# Patient Record
Sex: Female | Born: 1990 | Race: Black or African American | Hispanic: No | Marital: Married | Smoking: Never smoker
Health system: Southern US, Community
[De-identification: ages and names within clinical notes are randomized; demographics above are authoritative.]

## PROBLEM LIST (undated history)

## (undated) DIAGNOSIS — R55 Syncope and collapse: Secondary | ICD-10-CM

## (undated) DIAGNOSIS — M25559 Pain in unspecified hip: Secondary | ICD-10-CM

## (undated) DIAGNOSIS — K5900 Constipation, unspecified: Secondary | ICD-10-CM

## (undated) DIAGNOSIS — G8929 Other chronic pain: Secondary | ICD-10-CM

## (undated) DIAGNOSIS — F419 Anxiety disorder, unspecified: Secondary | ICD-10-CM

## (undated) DIAGNOSIS — M549 Dorsalgia, unspecified: Secondary | ICD-10-CM

## (undated) DIAGNOSIS — R7303 Prediabetes: Secondary | ICD-10-CM

## (undated) DIAGNOSIS — R51 Headache: Secondary | ICD-10-CM

## (undated) DIAGNOSIS — D499 Neoplasm of unspecified behavior of unspecified site: Secondary | ICD-10-CM

## (undated) DIAGNOSIS — R519 Headache, unspecified: Secondary | ICD-10-CM

## (undated) DIAGNOSIS — D649 Anemia, unspecified: Secondary | ICD-10-CM

## (undated) DIAGNOSIS — R109 Unspecified abdominal pain: Secondary | ICD-10-CM

## (undated) HISTORY — DX: Prediabetes: R73.03

## (undated) HISTORY — DX: Constipation, unspecified: K59.00

## (undated) HISTORY — DX: Neoplasm of unspecified behavior of unspecified site: D49.9

## (undated) HISTORY — PX: TONSILLECTOMY: SUR1361

## (undated) HISTORY — DX: Pain in unspecified hip: M25.559

## (undated) HISTORY — DX: Dorsalgia, unspecified: M54.9

## (undated) HISTORY — PX: LAPAROSCOPIC GASTRIC SLEEVE RESECTION: SHX5895

---
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
## 2010-12-31 ENCOUNTER — Encounter: Payer: Self-pay | Admitting: *Deleted

## 2010-12-31 ENCOUNTER — Emergency Department (HOSPITAL_BASED_OUTPATIENT_CLINIC_OR_DEPARTMENT_OTHER)
Admission: EM | Admit: 2010-12-31 | Discharge: 2010-12-31 | Disposition: A | Discharge status: Home / Self Care | Payer: BC Managed Care – PPO | Attending: Emergency Medicine | Admitting: Emergency Medicine

## 2010-12-31 DIAGNOSIS — R110 Nausea: Secondary | ICD-10-CM | POA: Insufficient documentation

## 2010-12-31 DIAGNOSIS — R109 Unspecified abdominal pain: Secondary | ICD-10-CM | POA: Insufficient documentation

## 2010-12-31 LAB — URINALYSIS, ROUTINE W REFLEX MICROSCOPIC
Bilirubin Urine: NEGATIVE
Glucose, UA: NEGATIVE mg/dL
Ketones, ur: NEGATIVE mg/dL
Specific Gravity, Urine: 1.023 (ref 1.005–1.030)
pH: 7.5 (ref 5.0–8.0)

## 2010-12-31 MED ORDER — ONDANSETRON 4 MG PO TBDP
4.0000 mg | ORAL_TABLET | Freq: Once | ORAL | Status: AC
  Administered 2010-12-31: 4 mg via ORAL
  Filled 2010-12-31: qty 1

## 2010-12-31 NOTE — ED Notes (Signed)
Pt c/o abd cramping and nausea. Pt states has had problems with IUD.

## 2010-12-31 NOTE — ED Notes (Signed)
Pt assessment unchanged.

## 2010-12-31 NOTE — ED Notes (Signed)
Pt reports her nausea has improved. States she took ibuprofen before coming to ER, and that since her pain has subsided, her nausea has too. Pt states she feels well enough for discharge "since you cant really take out my IUD, i guess i'll have to wait to see my GYN doctor"

## 2010-12-31 NOTE — ED Provider Notes (Signed)
History     Chief Complaint  Patient presents with  . Abdominal Cramping   Patient is a 21 y.o. female presenting with cramps. The history is provided by the patient.  Abdominal Cramping The primary symptoms of the illness include nausea. The current episode started more than 2 days ago. The onset of the illness was gradual. The problem has been gradually worsening.  The nausea is associated with eating.  Pt reports she thinks her IUD makes her feel sick.  Pt is requesting to have removed.  Pt called Dr. Tawni Levy office but he did not have an openning today.  Pt complains of nausea  History reviewed. No pertinent past medical history.  Past Surgical History  Procedure Date  . Tonsillectomy     History reviewed. No pertinent family history.  History  Substance Use Topics  . Smoking status: Never Smoker   . Smokeless tobacco: Not on file  . Alcohol Use: No    OB History    Grav Para Term Preterm Abortions TAB SAB Ect Mult Living                  Review of Systems  Gastrointestinal: Positive for nausea.  All other systems reviewed and are negative.    Physical Exam  BP 107/78  Pulse 70  Temp(Src) 98.7 F (37.1 C) (Oral)  Resp 16  Wt 260 lb (117.935 kg)  SpO2 100%  LMP 12/30/2010  Physical Exam  Constitutional: She appears well-developed and well-nourished.  HENT:  Head: Normocephalic.  Eyes: Pupils are equal, round, and reactive to light.  Neck: Normal range of motion.  Cardiovascular: Normal rate.   Abdominal: Soft. There is no tenderness.  Musculoskeletal: Normal range of motion.  Neurological: She is alert.  Skin: Skin is warm.    ED Course  Procedures  MDM Pt given zofran she feels better,   Pt advised she needs to schedule to see Dr. Shawnie Pons to have iud removed      Langston Masker, [place] 12/31/10 1945

## 2013-07-27 ENCOUNTER — Encounter (INDEPENDENT_AMBULATORY_CARE_PROVIDER_SITE_OTHER): Payer: Self-pay | Admitting: General Surgery

## 2013-07-27 ENCOUNTER — Other Ambulatory Visit (INDEPENDENT_AMBULATORY_CARE_PROVIDER_SITE_OTHER): Payer: Self-pay | Admitting: General Surgery

## 2013-07-27 ENCOUNTER — Ambulatory Visit (INDEPENDENT_AMBULATORY_CARE_PROVIDER_SITE_OTHER): Payer: BC Managed Care – PPO | Admitting: General Surgery

## 2013-07-27 VITALS — BP 120/70 | HR 66 | Temp 97.7°F | Resp 18 | Ht 66.0 in | Wt 277.8 lb

## 2013-07-27 LAB — LIPID PANEL
Cholesterol: 205 mg/dL — ABNORMAL HIGH (ref 0–200)
HDL: 55 mg/dL (ref >39)
LDL CALC: 134 mg/dL — AB (ref 0–99)
TRIGLYCERIDES: 81 mg/dL (ref <150)
Total CHOL/HDL Ratio: 3.7 Ratio
VLDL: 16 mg/dL (ref 0–40)

## 2013-07-27 LAB — HEMOGLOBIN A1C
Hgb A1c MFr Bld: 5.6 %
Mean Plasma Glucose: 114 mg/dL

## 2013-07-27 LAB — CBC
HCT: 35.6 % — ABNORMAL LOW (ref 36.0–46.0)
Hemoglobin: 11.7 g/dL — ABNORMAL LOW (ref 12.0–15.0)
MCH: 25.9 pg — ABNORMAL LOW (ref 26.0–34.0)
MCHC: 32.9 g/dL (ref 30.0–36.0)
MCV: 78.8 fL (ref 78.0–100.0)
PLATELETS: 266 10*3/uL (ref 150–400)
RBC: 4.52 MIL/uL (ref 3.87–5.11)
RDW: 15.2 % (ref 11.5–15.5)
WBC: 10.4 10*3/uL (ref 4.0–10.5)

## 2013-07-27 NOTE — Patient Instructions (Signed)
We will start our evaluation process Consider going to the support group meetings to learn more from actual patients who have undergone surgery. We can give you the schedule. Watch the EMMI videos on sleeve and gastric bypass If you would like to come back in and further discuss the two, please let me know

## 2013-07-28 LAB — HCG, SERUM, QUALITATIVE: Preg, Serum: NEGATIVE

## 2013-07-28 LAB — H. PYLORI ANTIBODY, IGG: H Pylori IgG: <0.4 {ISR}

## 2013-07-28 LAB — TSH: TSH: 0.764 u[IU]/mL (ref 0.350–4.500)

## 2013-07-31 NOTE — Progress Notes (Signed)
Patient ID: Nancy Garrett, female   DOB: 1991-02-03, 23 y.o.   MRN: [id number]  Chief Complaint  Patient presents with  . Bariatric Pre-op    HPI Nancy Garrett is a 23 y.o. female.   HPI 23 year old morbidly obese African American female is referred to discuss weight loss surgery. She is specifically interested in either the sleeve gastrectomy or gastric bypass.She has  Struggled with her weight ever since she was a little girl. Despite numerous attempts for sustained weight loss she has been unsuccessful. She has tried Atkins, bariatric clinic, hCG injections, phentermine all without any long-term success. She works as a Associate Professor in a salon  Her comorbidities include prediabetes, migraines, bilateral hip pain.  History reviewed. No pertinent past medical history.  Past Surgical History  Procedure Laterality Date  . Tonsillectomy      History reviewed. No pertinent family history.  Social History History  Substance Use Topics  . Smoking status: Never Smoker   . Smokeless tobacco: Not on file  . Alcohol Use: No    No Known Allergies  Current Outpatient Prescriptions  Medication Sig Dispense Refill  . levonorgestrel (MIRENA) 20 MCG/24HR IUD 1 each by Intrauterine route once.         No current facility-administered medications for this visit.    Review of Systems Review of Systems  Constitutional: Negative for fever, activity change, appetite change and unexpected weight change.  HENT: Negative for nosebleeds and trouble swallowing.   Eyes: Negative for photophobia and visual disturbance.  Respiratory: Negative for chest tightness and shortness of breath.        Epworth score 7  Cardiovascular: Negative for chest pain and leg swelling.       Denies CP, SOB, orthopnea, PND, DOE; some b/l ankle edema  Gastrointestinal: Negative for nausea, vomiting, abdominal pain, diarrhea, blood in stool and abdominal distention.       Denies reflux. Some constipation.    Endocrine:       Reports that she was told she was prediabetic  Genitourinary: Negative for dysuria and difficulty urinating.  Musculoskeletal: Negative for arthralgias.       B/l ankle and hip pain  Skin: Negative for pallor and rash.  Neurological: Positive for headaches (migraines - has taken topiramate for it). Negative for dizziness, seizures, facial asymmetry and numbness.       Denies TIA and amaurosis fugax   Hematological: Negative for adenopathy. Does not bruise/bleed easily.  Psychiatric/Behavioral: Negative for behavioral problems and agitation.    Blood pressure 120/70, pulse 66, temperature 97.7 F (36.5 C), temperature source Temporal, resp. rate 18, height 5\' 6"  (1.676 m), weight 277 lb 12.8 oz (126.009 kg).  Physical Exam Physical Exam  Vitals reviewed. Constitutional: She is oriented to person, place, and time. She appears well-developed and well-nourished. No distress.  HENT:  Head: Normocephalic and atraumatic.  Right Ear: External ear normal.  Left Ear: External ear normal.  Eyes: Conjunctivae are normal. No scleral icterus.  Neck: Normal range of motion. Neck supple. No tracheal deviation present. No thyromegaly present.  Cardiovascular: Normal rate and normal heart sounds.   Pulmonary/Chest: Effort normal and breath sounds normal. No stridor. No respiratory distress. She has no wheezes.  Abdominal: Soft. She exhibits no distension. There is no tenderness. There is no rebound and no guarding.  Belly button rng  Musculoskeletal: She exhibits no edema and no tenderness.  Lymphadenopathy:    She has no cervical adenopathy.  Neurological: She is alert and oriented to person,  place, and time. She exhibits normal muscle tone.  Skin: Skin is warm and dry. No rash noted. She is not diaphoretic. No erythema.  Psychiatric: She has a normal mood and affect. Her behavior is normal. Judgment and thought content normal.    Data Reviewed None available   Assessment     Morbid obesity BMI 44.8 B/l ankle pain B/l hip pain Migraines prediabetic      Plan    The patient meets weight loss surgery criteria. I think the patient would be an acceptable candidate for Laparoscopic Roux-en-Y Gastric bypass or Sleeve gastrectomy  We discussed laparoscopic Roux-en-Y gastric bypass. We discussed the preoperative, operative and postoperative process. Using diagrams, I explained the surgery in detail including the performance of an EGD near the end of the surgery and an Upper GI swallow study on POD 1. We discussed the typical hospital course including a 2-3 day stay baring any complications.   The patient was given educational material. I quoted the patient that they can expect to lose 50-70% of their excess weight with the gastric bypass. We did discuss the possibility of weight regain several years after the procedure.  We discussed the risk and benefits of surgery including but not limited to anesthesia risk, bleeding, infection, anastomotic edema requiring a few additional days in the hospital, postop nausea, possible conversion to open procedure, blood clot formation, anastomotic leak, anastomotic stricture, ulcer formation, death, respiratory complications, intestinal blockage, internal hernia, gallstone formation, vitamin and nutritional deficiencies, hair loss, weight regain injury to surrounding structures, failure to lose weight and mood changes.  We discussed that before and after surgery that there would be an alteration in their diet. I explained that we have put them on a diet 2 weeks before surgery. I also explained that they would be on a liquid diet for 2 weeks after surgery. We discussed that they would have to avoid certain foods such as sugar after surgery. We discussed the importance of physical activity as well as compliance with our dietary and supplement recommendations and routine follow-up.  We then discussed laparoscopic sleeve gastrectomy. We  discussed the preoperative, operative and postoperative process. Using diagrams, I explained the surgery in detail including the performance of an EGD near the end of the surgery and an Upper GI swallow study on POD 1. We discussed the typical hospital course including a 2-3 day stay baring any complications.   The patient was given educational material. I quoted the patient that most patients can lose up to 50-70% of their excess weight. We did discuss the possibility of weight regain several years after the procedure.  The risks of infection, bleeding, pain, scarring, weight regain, too little or too much weight loss, vitamin deficiencies and need for lifelong vitamin supplementation, hair loss, need for protein supplementation, leaks, stricture, reflux, food intolerance, gallstone formation, hernia, need for reoperation and conversion to roux Y gastric bypass, need for open surgery, injury to spleen or surrounding structures, DVT's, PE, and death again discussed with the patient and the patient expressed understanding and desires to proceed with laparoscopic vertical sleeve gastrectomy, possible open, intraoperative endoscopy.  I explained to the patient that we will start our evaluation process which includes labs, Upper GI to evaluate stomach and swallowing anatomy, nutritionist consultation, psychiatrist consultation, EKG, CXR, abdominal ultrasound. All of her questions were asked and answered. She was given access to watch videos about sleep gastrectomy and gastric bypass using the EMMI video system.  She was encouraged to contact the  office should she have any questions during the workup  Mary Sellaric M. Andrey CampanileWilson, MD, FACS General, Bariatric, & Minimally Invasive Surgery [facility], [place]          [facility] CenterWILSON,Jamielee Mchale M 07/31/2013, 4:08 PM

## 2013-08-15 ENCOUNTER — Ambulatory Visit (HOSPITAL_COMMUNITY)
Admission: RE | Admit: 2013-08-15 | Discharge: 2013-08-15 | Disposition: A | Discharge status: Home / Self Care | Payer: BC Managed Care – PPO | Source: Ambulatory Visit | Attending: General Surgery | Admitting: General Surgery

## 2013-08-15 ENCOUNTER — Other Ambulatory Visit: Payer: Self-pay

## 2013-08-15 DIAGNOSIS — M25559 Pain in unspecified hip: Secondary | ICD-10-CM | POA: Insufficient documentation

## 2013-08-15 DIAGNOSIS — R7309 Other abnormal glucose: Secondary | ICD-10-CM | POA: Insufficient documentation

## 2013-08-15 DIAGNOSIS — Z6841 Body Mass Index (BMI) 40.0 and over, adult: Secondary | ICD-10-CM | POA: Insufficient documentation

## 2013-08-15 DIAGNOSIS — M25579 Pain in unspecified ankle and joints of unspecified foot: Secondary | ICD-10-CM | POA: Insufficient documentation

## 2013-08-17 ENCOUNTER — Other Ambulatory Visit (INDEPENDENT_AMBULATORY_CARE_PROVIDER_SITE_OTHER): Payer: Self-pay

## 2013-09-23 ENCOUNTER — Encounter: Payer: BC Managed Care – PPO | Attending: General Surgery | Admitting: Dietician

## 2014-03-23 ENCOUNTER — Encounter (HOSPITAL_BASED_OUTPATIENT_CLINIC_OR_DEPARTMENT_OTHER): Payer: Self-pay | Admitting: Emergency Medicine

## 2014-03-23 ENCOUNTER — Emergency Department (HOSPITAL_BASED_OUTPATIENT_CLINIC_OR_DEPARTMENT_OTHER)
Admission: EM | Admit: 2014-03-23 | Discharge: 2014-03-23 | Disposition: A | Discharge status: Home / Self Care | Payer: BC Managed Care – PPO | Attending: Emergency Medicine | Admitting: Emergency Medicine

## 2014-03-23 DIAGNOSIS — R51 Headache: Secondary | ICD-10-CM | POA: Diagnosis present

## 2014-03-23 DIAGNOSIS — E669 Obesity, unspecified: Secondary | ICD-10-CM | POA: Insufficient documentation

## 2014-03-23 DIAGNOSIS — G43911 Migraine, unspecified, intractable, with status migrainosus: Secondary | ICD-10-CM | POA: Diagnosis not present

## 2014-03-23 HISTORY — DX: Headache, unspecified: R51.9

## 2014-03-23 HISTORY — DX: Headache: R51

## 2014-03-23 LAB — CBG MONITORING, ED: Glucose-Capillary: 70 mg/dL (ref 70–99)

## 2014-03-23 MED ORDER — OXYCODONE-ACETAMINOPHEN 5-325 MG PO TABS
2.0000 | ORAL_TABLET | Freq: Once | ORAL | Status: AC
  Administered 2014-03-23: 2 via ORAL
  Filled 2014-03-23: qty 2

## 2014-03-23 MED ORDER — SUMATRIPTAN SUCCINATE 50 MG PO TABS: 50.0000 mg | ORAL_TABLET | ORAL | Status: DC | PRN

## 2014-03-23 MED ORDER — PROCHLORPERAZINE MALEATE 10 MG PO TABS: 10.0000 mg | ORAL_TABLET | Freq: Two times a day (BID) | ORAL | Status: DC | PRN

## 2014-03-23 NOTE — Discharge Instructions (Signed)

## 2014-03-23 NOTE — ED Provider Notes (Signed)
CSN: [id number]     Arrival date & time 03/23/14  1716 History   First MD Initiated Contact with Patient 03/23/14 1822     Chief Complaint  Patient presents with  . Headache     (Consider location/radiation/quality/duration/timing/severity/associated sxs/prior Treatment) HPI 23 y.o. Female with migraine headaches for several years.  She feels they began when she had motion sickness on a cruise 3 years ago but she remembers getting migraines as a child when she got car sick.  She has migraines daily.  They are usually preceded by nausea and vertigo.  She denies lateralized weakness, fever, or neck pain.  SHe has been treated at her pmd with topamax   For prophylaxis but states it has not helped.  She has an appointment at the headache clinic in 3 weeks.  She feels the headaches are more frequent over the past several weeks.  THe headaches are present at all times of the day.  She denies headache being worse in the am and improving during the day although she is obese.  The headache is not worse with lying down.    Past Medical History  Diagnosis Date  . Head ache    Past Surgical History  Procedure Laterality Date  . Tonsillectomy     No family history on file. History  Substance Use Topics  . Smoking status: Never Smoker   . Smokeless tobacco: Not on file  . Alcohol Use: No   OB History   Grav Para Term Preterm Abortions TAB SAB Ect Mult Living                 Review of Systems  All other systems reviewed and are negative.     Allergies  Review of patient's allergies indicates no known allergies.  Home Medications   Prior to Admission medications   Medication Sig Start Date End Date Taking? Authorizing Provider  levonorgestrel (MIRENA) 20 MCG/24HR IUD 1 each by Intrauterine route once.      Historical Provider, MD   BP 122/78  Pulse 70  Temp(Src) 98.4 F (36.9 C) (Oral)  Resp 20  Wt 277 lb (125.646 kg)  SpO2 100%  LMP 03/23/2014 Physical Exam  Nursing note and  vitals reviewed. Constitutional: She is oriented to person, place, and time. She appears well-developed and well-nourished.  Obese  HENT:  Head: Normocephalic and atraumatic.  Right Ear: Tympanic membrane and external ear normal.  Left Ear: Tympanic membrane and external ear normal.  Nose: Nose normal. Right sinus exhibits no maxillary sinus tenderness and no frontal sinus tenderness. Left sinus exhibits no maxillary sinus tenderness and no frontal sinus tenderness.  Eyes: Conjunctivae and EOM are normal. Pupils are equal, round, and reactive to light. Right eye exhibits no nystagmus. Left eye exhibits no nystagmus.  Neck: Normal range of motion. Neck supple.  Cardiovascular: Normal rate, regular rhythm, normal heart sounds and intact distal pulses.   Pulmonary/Chest: Effort normal and breath sounds normal. No respiratory distress. She exhibits no tenderness.  Abdominal: Soft. Bowel sounds are normal. She exhibits no distension and no mass. There is no tenderness.  Musculoskeletal: Normal range of motion. She exhibits no edema and no tenderness.  Neurological: She is alert and oriented to person, place, and time. She has normal strength and normal reflexes. No sensory deficit. She displays a negative Romberg sign. GCS eye subscore is 4. GCS verbal subscore is 5. GCS motor subscore is 6.  Reflex Scores:      Tricep reflexes are  2+ on the right side and 2+ on the left side.      Bicep reflexes are 2+ on the right side and 2+ on the left side.      Brachioradialis reflexes are 2+ on the right side and 2+ on the left side.      Patellar reflexes are 2+ on the right side and 2+ on the left side.      Achilles reflexes are 2+ on the right side and 2+ on the left side. Patient with normal gait without ataxia, shuffling, spasm, or antalgia. Speech is normal without dysarthria, dysphasia, or aphasia. Muscle strength is 5/5 in bilateral shoulders, elbow flexor and extensors, wrist flexor and extensors,  and intrinsic hand muscles. 5/5 bilateral lower extremity hip flexors, extensors, knee flexors and extensors, and ankle dorsi and plantar flexors.    Skin: Skin is warm and dry. No rash noted.  Psychiatric: She has a normal mood and affect. Her behavior is normal. Judgment and thought content normal.    ED Course  Procedures (including critical care time) Labs Review Labs Reviewed  CBG MONITORING, ED    Imaging Review No results found.   EKG Interpretation None      MDM   Final diagnoses:  Intractable migraine with status migrainosus, unspecified migraine type    Patient has had MRI of her primary care physicians within the past year which she self reports as normal. I am unable to obtain these results currently. Plan Percocet to treat acute pain today.. I will also give her a prescription for tryptan and place  her on Compazine as an outpatient to see if this helps her headaches while she is waiting to be seen by a headache specialist. I have discussed return precautions with her and need for close followup and she voices understanding.    Hilario Quarryanielle S Jamond Neels, MD 03/23/14 [phone]

## 2014-03-23 NOTE — ED Notes (Signed)
Headache x 3 weeks. Hx of headaches x 3 years.

## 2014-10-29 ENCOUNTER — Emergency Department (HOSPITAL_COMMUNITY)
Admission: EM | Admit: 2014-10-29 | Discharge: 2014-10-29 | Disposition: A | Discharge status: Home / Self Care | Payer: Self-pay | Attending: Emergency Medicine | Admitting: Emergency Medicine

## 2014-10-29 ENCOUNTER — Encounter (HOSPITAL_COMMUNITY): Payer: Self-pay | Admitting: *Deleted

## 2014-10-29 DIAGNOSIS — R51 Headache: Secondary | ICD-10-CM | POA: Insufficient documentation

## 2014-10-29 DIAGNOSIS — Z8719 Personal history of other diseases of the digestive system: Secondary | ICD-10-CM | POA: Insufficient documentation

## 2014-10-29 DIAGNOSIS — G8929 Other chronic pain: Secondary | ICD-10-CM | POA: Insufficient documentation

## 2014-10-29 DIAGNOSIS — Z79899 Other long term (current) drug therapy: Secondary | ICD-10-CM | POA: Insufficient documentation

## 2014-10-29 DIAGNOSIS — A599 Trichomoniasis, unspecified: Secondary | ICD-10-CM | POA: Insufficient documentation

## 2014-10-29 HISTORY — DX: Unspecified abdominal pain: R10.9

## 2014-10-29 HISTORY — DX: Other chronic pain: G89.29

## 2014-10-29 LAB — COMPREHENSIVE METABOLIC PANEL
ALT: 8 U/L — ABNORMAL LOW (ref 14–54)
ANION GAP: 8 (ref 5–15)
AST: 21 U/L (ref 15–41)
Albumin: 3.6 g/dL (ref 3.5–5.0)
Alkaline Phosphatase: 97 U/L (ref 38–126)
BILIRUBIN TOTAL: 1 mg/dL (ref 0.3–1.2)
BUN: 11 mg/dL (ref 6–20)
CALCIUM: 9.2 mg/dL (ref 8.9–10.3)
CHLORIDE: 105 mmol/L (ref 101–111)
CO2: 26 mmol/L (ref 22–32)
CREATININE: 0.7 mg/dL (ref 0.44–1.00)
GLUCOSE: 98 mg/dL (ref 65–99)
Potassium: 4.6 mmol/L (ref 3.5–5.1)
Sodium: 139 mmol/L (ref 135–145)
Total Protein: 6.5 g/dL (ref 6.5–8.1)

## 2014-10-29 LAB — CBC WITH DIFFERENTIAL/PLATELET
BASOS ABS: 0 10*3/uL (ref 0.0–0.1)
BASOS PCT: 0 % (ref 0–1)
EOS PCT: 0 % (ref 0–5)
Eosinophils Absolute: 0 10*3/uL (ref 0.0–0.7)
HEMATOCRIT: 36.9 % (ref 36.0–46.0)
HEMOGLOBIN: 11.9 g/dL — AB (ref 12.0–15.0)
Lymphocytes Relative: 28 % (ref 12–46)
Lymphs Abs: 2.8 10*3/uL (ref 0.7–4.0)
MCH: 26 pg (ref 26.0–34.0)
MCHC: 32.2 g/dL (ref 30.0–36.0)
MCV: 80.7 fL (ref 78.0–100.0)
MONO ABS: 0.5 10*3/uL (ref 0.1–1.0)
MONOS PCT: 5 % (ref 3–12)
NEUTROS ABS: 6.8 10*3/uL (ref 1.7–7.7)
Neutrophils Relative %: 67 % (ref 43–77)
Platelets: 255 10*3/uL (ref 150–400)
RBC: 4.57 MIL/uL (ref 3.87–5.11)
RDW: 15.7 % — ABNORMAL HIGH (ref 11.5–15.5)
WBC: 10.1 10*3/uL (ref 4.0–10.5)

## 2014-10-29 LAB — URINALYSIS, ROUTINE W REFLEX MICROSCOPIC
BILIRUBIN URINE: NEGATIVE
Glucose, UA: NEGATIVE mg/dL
KETONES UR: NEGATIVE mg/dL
NITRITE: NEGATIVE
PROTEIN: NEGATIVE mg/dL
SPECIFIC GRAVITY, URINE: 1.014 (ref 1.005–1.030)
UROBILINOGEN UA: 0.2 mg/dL (ref 0.0–1.0)
pH: 7 (ref 5.0–8.0)

## 2014-10-29 LAB — WET PREP, GENITAL: YEAST WET PREP: NONE SEEN

## 2014-10-29 LAB — URINE MICROSCOPIC-ADD ON

## 2014-10-29 LAB — LIPASE, BLOOD: Lipase: 17 U/L — ABNORMAL LOW (ref 22–51)

## 2014-10-29 LAB — I-STAT BETA HCG BLOOD, ED (MC, WL, AP ONLY): I-stat hCG, quantitative: <5 m[IU]/mL (ref <5)

## 2014-10-29 MED ORDER — METRONIDAZOLE 500 MG PO TABS: 500.0000 mg | ORAL_TABLET | Freq: Two times a day (BID) | ORAL | Status: DC

## 2014-10-29 MED ORDER — METOCLOPRAMIDE HCL 5 MG/ML IJ SOLN
10.0000 mg | Freq: Once | INTRAMUSCULAR | Status: AC
  Administered 2014-10-29: 10 mg via INTRAVENOUS
  Filled 2014-10-29: qty 2

## 2014-10-29 MED ORDER — DICYCLOMINE HCL 10 MG/ML IM SOLN
20.0000 mg | Freq: Once | INTRAMUSCULAR | Status: AC
  Administered 2014-10-29: 20 mg via INTRAMUSCULAR
  Filled 2014-10-29: qty 2

## 2014-10-29 MED ORDER — SODIUM CHLORIDE 0.9 % IV BOLUS (SEPSIS)
500.0000 mL | Freq: Once | INTRAVENOUS | Status: AC
  Administered 2014-10-29: 500 mL via INTRAVENOUS

## 2014-10-29 MED ORDER — IBUPROFEN 800 MG PO TABS
800.0000 mg | ORAL_TABLET | Freq: Once | ORAL | Status: AC
Start: 2014-10-29 — End: 2014-10-29
  Administered 2014-10-29: 800 mg via ORAL
  Filled 2014-10-29: qty 1

## 2014-10-29 NOTE — ED Notes (Signed)
Pelvic cart set up at bedside  

## 2014-10-29 NOTE — Discharge Instructions (Signed)
Sexually Transmitted Disease Take antibiotics as prescribed.  Avoid drinking alcohol for 24 hours after taking this medication. Your sexual partner should also be treated.  A sexually transmitted disease (STD) is a disease or infection that may be passed (transmitted) from person to person, usually during sexual activity. This may happen by way of saliva, semen, blood, vaginal mucus, or urine. Common STDs include:   Gonorrhea.   Chlamydia.   Syphilis.   HIV and AIDS.   Genital herpes.   Hepatitis B and C.   Trichomonas.   Human papillomavirus (HPV).   Pubic lice.   Scabies.  Mites.  Bacterial vaginosis. WHAT ARE CAUSES OF STDs? An STD may be caused by bacteria, a virus, or parasites. STDs are often transmitted during sexual activity if one person is infected. However, they may also be transmitted through nonsexual means. STDs may be transmitted after:   Sexual intercourse with an infected person.   Sharing sex toys with an infected person.   Sharing needles with an infected person or using unclean piercing or tattoo needles.  Having intimate contact with the genitals, mouth, or rectal areas of an infected person.   Exposure to infected fluids during birth. WHAT ARE THE SIGNS AND SYMPTOMS OF STDs? Different STDs have different symptoms. Some people may not have any symptoms. If symptoms are present, they may include:   Painful or bloody urination.   Pain in the pelvis, abdomen, vagina, anus, throat, or eyes.   A skin rash, itching, or irritation.  Growths, ulcerations, blisters, or sores in the genital and anal areas.  Abnormal vaginal discharge with or without bad odor.   Penile discharge in men.   Fever.   Pain or bleeding during sexual intercourse.   Swollen glands in the groin area.   Yellow skin and eyes (jaundice). This is seen with hepatitis.   Swollen testicles.  Infertility.  Sores and blisters in the mouth. HOW ARE STDs  DIAGNOSED? To make a diagnosis, your health care provider may:   Take a medical history.   Perform a physical exam.   Take a sample of any discharge to examine.  Swab the throat, cervix, opening to the penis, rectum, or vagina for testing.  Test a sample of your first morning urine.   Perform blood tests.   Perform a Pap test, if this applies.   Perform a colposcopy.   Perform a laparoscopy.  HOW ARE STDs TREATED? Treatment depends on the STD. Some STDs may be treated but not cured.   Chlamydia, gonorrhea, trichomonas, and syphilis can be cured with antibiotic medicine.   Genital herpes, hepatitis, and HIV can be treated, but not cured, with prescribed medicines. The medicines lessen symptoms.   Genital warts from HPV can be treated with medicine or by freezing, burning (electrocautery), or surgery. Warts may come back.   HPV cannot be cured with medicine or surgery. However, abnormal areas may be removed from the cervix, vagina, or vulva.   If your diagnosis is confirmed, your recent sexual partners need treatment. This is true even if they are symptom-free or have a negative culture or evaluation. They should not have sex until their health care providers say it is okay. HOW CAN I REDUCE MY RISK OF GETTING AN STD? Take these steps to reduce your risk of getting an STD:  Use latex condoms, dental dams, and water-soluble lubricants during sexual activity. Do not use petroleum jelly or oils.  Avoid having multiple sex partners.  Do not have  sex with someone who has other sex partners.  Do not have sex with anyone you do not know or who is at high risk for an STD.  Avoid risky sex practices that can break your skin.  Do not have sex if you have open sores on your mouth or skin.  Avoid drinking too much alcohol or taking illegal drugs. Alcohol and drugs can affect your judgment and put you in a vulnerable position.  Avoid engaging in oral and anal sex  acts.  Get vaccinated for HPV and hepatitis. If you have not received these vaccines in the past, talk to your health care provider about whether one or both might be right for you.   If you are at risk of being infected with HIV, it is recommended that you take a prescription medicine daily to prevent HIV infection. This is called pre-exposure prophylaxis (PrEP). You are considered at risk if:  You are a man who has sex with other men (MSM).  You are a heterosexual man or woman and are sexually active with more than one partner.  You take drugs by injection.  You are sexually active with a partner who has HIV.  Talk with your health care provider about whether you are at high risk of being infected with HIV. If you choose to begin PrEP, you should first be tested for HIV. You should then be tested every 3 months for as long as you are taking PrEP.  WHAT SHOULD I DO IF I THINK I HAVE AN STD?  See your health care provider.   Tell your sexual partner(s). They should be tested and treated for any STDs.  Do not have sex until your health care provider says it is okay. WHEN SHOULD I GET IMMEDIATE MEDICAL CARE? Contact your health care provider right away if:   You have severe abdominal pain.  You are a man and notice swelling or pain in your testicles.  You are a woman and notice swelling or pain in your vagina. Document Released: 08/22/2002 Document Revised: 06/06/2013 Document Reviewed: 12/20/2012 [employer] Patient Information 2015 [employer] [place]. This information is not intended to replace advice given to you by your health care provider. Make sure you discuss any questions you have with your health care provider.

## 2014-10-29 NOTE — ED Notes (Signed)
Pt is in stable condition upon d/c and ambulates from ED. 

## 2014-10-29 NOTE — ED Notes (Signed)
Pt arrives from home via POV. Pt has c/o n/v/d. Pt states she woke up around 0500 and started having vomiting and diarrhea. Pt states she almost always as a h/a and also reports she has been having intermittent abd pain x 1 year.

## 2014-10-29 NOTE — ED Provider Notes (Signed)
CSN: [id number]     Arrival date & time 10/29/14  0957 History   First MD Initiated Contact with Patient 10/29/14 1028     Chief Complaint  Patient presents with  . Emesis  . Diarrhea     (Consider location/radiation/quality/duration/timing/severity/associated sxs/prior Treatment) Patient is a 24 y.o. female presenting with vomiting and diarrhea. The history is provided by the patient. No language interpreter was used.  Emesis Associated symptoms: abdominal pain, diarrhea and headaches   Associated symptoms: no chills   Diarrhea Associated symptoms: abdominal pain, headaches and vomiting   Associated symptoms: no chills and no fever    Nancy Garrett is a 24 y.o female with a history of headaches and chronic abdominal pain who presents for intermittent, sharp abdominal pain, nausea, vomiting, and diarrhea that began today at 5 am.  She is also complaining of intermittent headaches that began on Saturday.  She states that she is followed by a gastroenterologist for chronic constipation. She take topamax for her headaches as prescribed by her pcp. Her LMP was 09/27/14.  Past Medical History  Diagnosis Date  . Head ache   . Chronic abdominal pain    Past Surgical History  Procedure Laterality Date  . Tonsillectomy     History reviewed. No pertinent family history. History  Substance Use Topics  . Smoking status: Never Smoker   . Smokeless tobacco: Not on file  . Alcohol Use: No   OB History    No data available     Review of Systems  Constitutional: Negative for fever and chills.  Eyes: Negative for photophobia and visual disturbance.  Gastrointestinal: Positive for nausea, vomiting, abdominal pain and diarrhea. Negative for blood in stool.  Genitourinary: Negative for urgency, frequency, hematuria, difficulty urinating and menstrual problem.  Neurological: Positive for headaches. Negative for dizziness, syncope and light-headedness.  All other systems reviewed and are  negative.     Allergies  Ivp dye  Home Medications   Prior to Admission medications   Medication Sig Start Date End Date Taking? Authorizing Provider  topiramate (TOPAMAX) 50 MG tablet Take 50 mg by mouth 2 (two) times daily.   Yes Historical Provider, MD  metroNIDAZOLE (FLAGYL) 500 MG tablet Take 1 tablet (500 mg total) by mouth 2 (two) times daily. 10/29/14   Dora Simeone Patel-Mills, PA-C  prochlorperazine (COMPAZINE) 10 MG tablet Take 1 tablet (10 mg total) by mouth 2 (two) times daily as needed for nausea or vomiting. Patient not taking: Reported on 10/29/2014 03/23/14   Margarita Grizzleanielle Ray, MD  SUMAtriptan (IMITREX) 50 MG tablet Take 1 tablet (50 mg total) by mouth every 2 (two) hours as needed for migraine or headache. May repeat in 2 hours if headache persists or recurs. Patient not taking: Reported on 10/29/2014 03/23/14   Margarita Grizzleanielle Ray, MD   BP 106/66 mmHg  Pulse 85  Temp(Src) 98.2 F (36.8 C) (Oral)  Resp 16  Ht 5\' 6"  (1.676 m)  Wt 280 lb (127.007 kg)  BMI 45.21 kg/m2  SpO2 100%  LMP 09/27/2014 Physical Exam  Constitutional: She is oriented to person, place, and time. She appears well-developed and well-nourished.  HENT:  Head: Normocephalic and atraumatic.  Eyes: Conjunctivae are normal.  Neck: Normal range of motion. Neck supple.  Cardiovascular: Normal rate, regular rhythm and normal heart sounds.   Pulmonary/Chest: Effort normal and breath sounds normal.  Abdominal: Soft. She exhibits no distension and no mass. There is tenderness in the suprapubic area. There is no rebound, no guarding and no  CVA tenderness.  Genitourinary:  Pelvic Exam: Chaperone present.  Minimum vaginal bleeding. No CMT.  Cervical os is closed.   Musculoskeletal: Normal range of motion.  Neurological: She is alert and oriented to person, place, and time. She has normal strength. No sensory deficit. GCS eye subscore is 4. GCS verbal subscore is 5. GCS motor subscore is 6.  Patient ambulating without  difficulty.    Skin: Skin is warm and dry.    ED Course  Procedures (including critical care time) Labs Review Labs Reviewed  WET PREP, GENITAL - Abnormal; Notable for the following:    Trich, Wet Prep MODERATE (*)    Clue Cells Wet Prep HPF POC FEW (*)    WBC, Wet Prep HPF POC FEW (*)    All other components within normal limits  CBC WITH DIFFERENTIAL/PLATELET - Abnormal; Notable for the following:    Hemoglobin 11.9 (*)    RDW 15.7 (*)    All other components within normal limits  COMPREHENSIVE METABOLIC PANEL - Abnormal; Notable for the following:    ALT 8 (*)    All other components within normal limits  LIPASE, BLOOD - Abnormal; Notable for the following:    Lipase 17 (*)    All other components within normal limits  URINALYSIS, ROUTINE W REFLEX MICROSCOPIC - Abnormal; Notable for the following:    APPearance CLOUDY (*)    Hgb urine dipstick MODERATE (*)    Leukocytes, UA TRACE (*)    All other components within normal limits  URINE MICROSCOPIC-ADD ON - Abnormal; Notable for the following:    Squamous Epithelial / LPF FEW (*)    Bacteria, UA FEW (*)    All other components within normal limits  URINE CULTURE  RPR  HIV ANTIBODY (ROUTINE TESTING)  I-STAT BETA HCG BLOOD, ED ([facility], [facility], [facility] ONLY)  GC/CHLAMYDIA PROBE AMP ([place])    Imaging Review No results found.   EKG Interpretation None      MDM   Final diagnoses:  Trichomonas infection  Patient with history of headaches and chronic abdominal pain.  She presents for intermittent abdominal pain, n/v/d since 5 am and headaches since Saturday.  She states her headache is similar to the headaches she has had in the past. She has suprapubic abdominal tenderness to palpation on exam.   She was given IV fluids and reglan while in the ED. She states her stomach pain is much better. She has not had any vomiting while in the ED but did have a couple of episodes of diarrhea.   Her labs are unremarkable. Her UA and wet  prep show moderate trichomonas. I discussed the findings with the patient as well as taking metronidazole and avoiding alcohol use.  I also told her that her partner should be treated as well. I gave her women's health follow up.  She agrees with the plan.     Catha GosselinHanna Patel-Mills, PA-C 10/29/14 1516  Doug SouSam Jacubowitz, MD 10/29/14 [phone]

## 2014-10-29 NOTE — ED Notes (Signed)
Patient stated that she had just gone to the bathroom but to give her 10 minutes and she would try.

## 2014-10-30 LAB — URINE CULTURE
Colony Count: NO GROWTH
Culture: NO GROWTH

## 2014-10-30 LAB — RPR: RPR Ser Ql: NONREACTIVE

## 2014-10-30 LAB — GC/CHLAMYDIA PROBE AMP (~~LOC~~) NOT AT ARMC
Chlamydia: NEGATIVE
Neisseria Gonorrhea: NEGATIVE

## 2014-10-30 LAB — HIV ANTIBODY (ROUTINE TESTING W REFLEX): HIV SCREEN 4TH GENERATION: NONREACTIVE

## 2014-11-08 ENCOUNTER — Encounter (HOSPITAL_BASED_OUTPATIENT_CLINIC_OR_DEPARTMENT_OTHER): Payer: Self-pay

## 2014-11-08 ENCOUNTER — Emergency Department (HOSPITAL_BASED_OUTPATIENT_CLINIC_OR_DEPARTMENT_OTHER)
Admission: EM | Admit: 2014-11-08 | Discharge: 2014-11-08 | Disposition: A | Discharge status: Home / Self Care | Payer: Self-pay | Attending: Emergency Medicine | Admitting: Emergency Medicine

## 2014-11-08 DIAGNOSIS — Z79899 Other long term (current) drug therapy: Secondary | ICD-10-CM | POA: Insufficient documentation

## 2014-11-08 DIAGNOSIS — R51 Headache: Secondary | ICD-10-CM | POA: Insufficient documentation

## 2014-11-08 DIAGNOSIS — R519 Headache, unspecified: Secondary | ICD-10-CM

## 2014-11-08 DIAGNOSIS — Z3202 Encounter for pregnancy test, result negative: Secondary | ICD-10-CM | POA: Insufficient documentation

## 2014-11-08 DIAGNOSIS — G8929 Other chronic pain: Secondary | ICD-10-CM | POA: Insufficient documentation

## 2014-11-08 LAB — PREGNANCY, URINE: PREG TEST UR: NEGATIVE

## 2014-11-08 LAB — BASIC METABOLIC PANEL
Anion gap: 12 (ref 5–15)
BUN: 12 mg/dL (ref 6–20)
CO2: 24 mmol/L (ref 22–32)
Calcium: 9.5 mg/dL (ref 8.9–10.3)
Chloride: 104 mmol/L (ref 101–111)
Creatinine, Ser: 0.54 mg/dL (ref 0.44–1.00)
GFR calc Af Amer: >60 mL/min (ref >60)
GFR calc non Af Amer: >60 mL/min (ref >60)
GLUCOSE: 99 mg/dL (ref 65–99)
POTASSIUM: 4.3 mmol/L (ref 3.5–5.1)
Sodium: 140 mmol/L (ref 135–145)

## 2014-11-08 MED ORDER — SODIUM CHLORIDE 0.9 % IV BOLUS (SEPSIS)
1000.0000 mL | Freq: Once | INTRAVENOUS | Status: AC
  Administered 2014-11-08: 1000 mL via INTRAVENOUS

## 2014-11-08 MED ORDER — DIPHENHYDRAMINE HCL 50 MG/ML IJ SOLN
25.0000 mg | Freq: Once | INTRAMUSCULAR | Status: AC
  Administered 2014-11-08: 25 mg via INTRAVENOUS
  Filled 2014-11-08: qty 1

## 2014-11-08 MED ORDER — ACETAZOLAMIDE 125 MG PO TABS: 125.0000 mg | ORAL_TABLET | Freq: Three times a day (TID) | ORAL | Status: DC

## 2014-11-08 MED ORDER — METOCLOPRAMIDE HCL 5 MG/ML IJ SOLN
10.0000 mg | Freq: Once | INTRAMUSCULAR | Status: AC
  Administered 2014-11-08: 10 mg via INTRAVENOUS
  Filled 2014-11-08: qty 2

## 2014-11-08 MED ORDER — DEXAMETHASONE SODIUM PHOSPHATE 10 MG/ML IJ SOLN
10.0000 mg | Freq: Once | INTRAMUSCULAR | Status: AC
  Administered 2014-11-08: 10 mg via INTRAVENOUS
  Filled 2014-11-08: qty 1

## 2014-11-08 MED ORDER — KETOROLAC TROMETHAMINE 30 MG/ML IJ SOLN
30.0000 mg | Freq: Once | INTRAMUSCULAR | Status: AC
  Administered 2014-11-08: 30 mg via INTRAVENOUS
  Filled 2014-11-08: qty 1

## 2014-11-08 MED ORDER — METOCLOPRAMIDE HCL 10 MG PO TABS: 10.0000 mg | ORAL_TABLET | Freq: Four times a day (QID) | ORAL | Status: DC | PRN

## 2014-11-08 NOTE — ED Provider Notes (Signed)
CSN: [id number]     Arrival date & time 11/08/14  0865 History   First MD Initiated Contact with Patient 11/08/14 [phone]     Chief Complaint  Patient presents with  . Migraine     (Consider location/radiation/quality/duration/timing/severity/associated sxs/prior Treatment) Patient is a 24 y.o. female presenting with migraines.  Migraine This is a recurrent problem. Episode onset: 4 days ago. The problem occurs constantly. The problem has not changed since onset.Pertinent negatives include no chest pain, no abdominal pain, no headaches and no shortness of breath. The symptoms are aggravated by standing and walking. Nothing relieves the symptoms.    Past Medical History  Diagnosis Date  . Head ache   . Chronic abdominal pain    Past Surgical History  Procedure Laterality Date  . Tonsillectomy     No family history on file. History  Substance Use Topics  . Smoking status: Never Smoker   . Smokeless tobacco: Not on file  . Alcohol Use: No   OB History    No data available     Review of Systems  Respiratory: Negative for shortness of breath.   Cardiovascular: Negative for chest pain.  Gastrointestinal: Negative for abdominal pain.  Neurological: Negative for headaches.  All other systems reviewed and are negative.     Allergies  Ivp dye  Home Medications   Prior to Admission medications   Medication Sig Start Date End Date Taking? Authorizing Provider  acetaZOLAMIDE (DIAMOX) 125 MG tablet Take 1 tablet (125 mg total) by mouth 3 (three) times daily. 11/08/14   Mirian Mo, MD  metoCLOPramide (REGLAN) 10 MG tablet Take 1 tablet (10 mg total) by mouth every 6 (six) hours as needed (Headache). 11/08/14   Mirian Mo, MD  metroNIDAZOLE (FLAGYL) 500 MG tablet Take 1 tablet (500 mg total) by mouth 2 (two) times daily. 10/29/14   Hanna Patel-Mills, PA-C  prochlorperazine (COMPAZINE) 10 MG tablet Take 1 tablet (10 mg total) by mouth 2 (two) times daily as needed for nausea  or vomiting. Patient not taking: Reported on 10/29/2014 03/23/14   Margarita Grizzle, MD  SUMAtriptan (IMITREX) 50 MG tablet Take 1 tablet (50 mg total) by mouth every 2 (two) hours as needed for migraine or headache. May repeat in 2 hours if headache persists or recurs. Patient not taking: Reported on 10/29/2014 03/23/14   Margarita Grizzle, MD  topiramate (TOPAMAX) 50 MG tablet Take 50 mg by mouth 2 (two) times daily.    Historical Provider, MD   BP 108/64 mmHg  Pulse 68  Temp(Src) 98.2 F (36.8 C) (Oral)  Resp 22  Ht  (1.676 m)  Wt 285 lb (129.275 kg)  BMI 46.02 kg/m2  SpO2 100%  LMP 10/29/2014 Physical Exam  Constitutional: She is oriented to person, place, and time. She appears well-developed and well-nourished.  HENT:  Head: Normocephalic and atraumatic.  Right Ear: External ear normal.  Left Ear: External ear normal.  Eyes: Conjunctivae and EOM are normal. Pupils are equal, round, and reactive to light.  Neck: Normal range of motion. Neck supple.  Cardiovascular: Normal rate, regular rhythm, normal heart sounds and intact distal pulses.   Pulmonary/Chest: Effort normal and breath sounds normal.  Abdominal: Soft. Bowel sounds are normal. There is no tenderness.  Musculoskeletal: Normal range of motion.  Neurological: She is alert and oriented to person, place, and time. She has normal strength and normal reflexes. No cranial nerve deficit or sensory deficit. Coordination normal. GCS eye subscore is 4. GCS verbal subscore  is 5. GCS motor subscore is 6.  Skin: Skin is warm and dry.  Vitals reviewed.   ED Course  Procedures (including critical care time) Labs Review Labs Reviewed  PREGNANCY, URINE  BASIC METABOLIC PANEL  CBC WITH DIFFERENTIAL/PLATELET    Imaging Review No results found.   EKG Interpretation None      MDM   Final diagnoses:  Acute nonintractable headache, unspecified headache type    24 y.o. female with pertinent PMH of chronic ha (q3-4 months, on  topamax) presents with acute on chronic ha.  No fever, vomiting.  Ha identical to prior. Physical exam on arrival as above reassuring, no meningitic signs, benign exam.  Pt states she has L eye blurring but has intact EOM and visual fields.  She also states symptoms change with posture.  In an obese female with concurrent visual complaint, would considner BIH, and pt states she has never had an LP before, but has had imaging of her head which was unremarkable.  Discussed utility of LP as well as risks, with shared decision making agreed to defer at this time given that pt had total relief of symptoms with reglan, benadryl, toradol, and decadron.  DC home in stable condition with prescriptions for reglan, as well as for acetazolamide for empiric therapy of pseudotumor, however I discussed that she should attempt to obtain a neurology appointment before starting this medication.  I have reviewed all laboratory and imaging studies if ordered as above  1. Acute nonintractable headache, unspecified headache type         Mirian MoMatthew Gentry, MD 11/08/14 [phone]

## 2014-11-08 NOTE — ED Notes (Signed)
Pt c/o migraine headache across forehead, pressure to lt eye x3days; denies nausea; states having blurred vision

## 2014-11-08 NOTE — Discharge Instructions (Signed)
Idiopathic Intracranial Hypertension Idiopathic intracranial hypertension (IIH) is a neurologic disorder that leads to increased pressure around your brain. It can cause vision loss and blindness if left untreated. RISK FACTORS IIH is most common in very overweight (obese) women of childbearing age. SIGNS AND SYMPTOMS  Symptoms of IIH include:  Headache.  Feeling of sickness in your stomach (nausea).  Vomiting.  A "rushing of water" sound within your ears (pulsatile tinnitus).  Double vision. DIAGNOSIS  Idiopathic intracranial hypertension is diagnosed with the aid of different exams:  Brain scans such as:  CT.  MRI.  MRV.  Diagnostic lumbar puncture. This procedure can determine if there is too much spinal fluid within the central nervous system. Too much spinal fluid can increase intracranial pressure.  A thorough eye exam will be done to look for swelling within the eyes. Visual field testing will also be done to see if any damage has occurred to nerves in the eyes. TREATMENT  Treatment of idiopathic intracranial hypertension is based on symptoms. Common treatments include:  Lumbar puncture to remove excess spinal fluid.  Medicine.  Surgery. HOME CARE INSTRUCTIONS The most important thing anyone can do to improve this condition is lose weight if they are overweight.  SEEK MEDICAL CARE IF:  You have changes in vision.  You have double vision.  You have loss of color vision. SEEK IMMEDIATE MEDICAL CARE IF:   Your headaches get worse rather than better.  Nausea or vomiting or both continue after treatment.  Your vision does not improve or gets worse after treatment. MAKE SURE YOU:  Understand these instructions.  Will watch your condition.  Will get help right away if you are not doing well or get worse. Document Released: 08/10/2001 Document Revised: 06/06/2013 Document Reviewed: 02/06/2013 [facility] Patient Information 2015 [place], [place]. This  information is not intended to replace advice given to you by your health care provider. Make sure you discuss any questions you have with your health care provider. General Headache Without Cause A headache is pain or discomfort felt around the head or neck area. The specific cause of a headache may not be found. There are many causes and types of headaches. A few common ones are:  Tension headaches.  Migraine headaches.  Cluster headaches.  Chronic daily headaches. HOME CARE INSTRUCTIONS   Keep all follow-up appointments with your caregiver or any specialist referral.  Only take over-the-counter or prescription medicines for pain or discomfort as directed by your caregiver.  Lie down in a dark, quiet room when you have a headache.  Keep a headache journal to find out what may trigger your migraine headaches. For example, write down:  What you eat and drink.  How much sleep you get.  Any change to your diet or medicines.  Try massage or other relaxation techniques.  Put ice packs or heat on the head and neck. Use these 3 to 4 times per day for 15 to 20 minutes each time, or as needed.  Limit stress.  Sit up straight, and do not tense your muscles.  Quit smoking if you smoke.  Limit alcohol use.  Decrease the amount of caffeine you drink, or stop drinking caffeine.  Eat and sleep on a regular schedule.  Get 7 to 9 hours of sleep, or as recommended by your caregiver.  Keep lights dim if bright lights bother you and make your headaches worse. SEEK MEDICAL CARE IF:   You have problems with the medicines you were prescribed.  Your medicines  are not working.  You have a change from the usual headache.  You have nausea or vomiting. SEEK IMMEDIATE MEDICAL CARE IF:   Your headache becomes severe.  You have a fever.  You have a stiff neck.  You have loss of vision.  You have muscular weakness or loss of muscle control.  You start losing your balance or have  trouble walking.  You feel faint or pass out.  You have severe symptoms that are different from your first symptoms. MAKE SURE YOU:   Understand these instructions.  Will watch your condition.  Will get help right away if you are not doing well or get worse. Document Released: 06/01/2005 Document Revised: 08/24/2011 Document Reviewed: 06/17/2011 [facility] Patient Information 2015 [place], [place]. This information is not intended to replace advice given to you by your health care provider. Make sure you discuss any questions you have with your health care provider.

## 2014-12-03 ENCOUNTER — Ambulatory Visit: Payer: Self-pay | Admitting: Neurology

## 2015-02-26 ENCOUNTER — Encounter (HOSPITAL_BASED_OUTPATIENT_CLINIC_OR_DEPARTMENT_OTHER): Payer: Self-pay | Admitting: Emergency Medicine

## 2015-02-26 ENCOUNTER — Emergency Department (HOSPITAL_BASED_OUTPATIENT_CLINIC_OR_DEPARTMENT_OTHER)
Admission: EM | Admit: 2015-02-26 | Discharge: 2015-02-27 | Disposition: A | Discharge status: Home / Self Care | Payer: Medicaid Other | Attending: Emergency Medicine | Admitting: Emergency Medicine

## 2015-02-26 DIAGNOSIS — R51 Headache: Secondary | ICD-10-CM | POA: Insufficient documentation

## 2015-02-26 DIAGNOSIS — Z331 Pregnant state, incidental: Secondary | ICD-10-CM | POA: Diagnosis not present

## 2015-02-26 DIAGNOSIS — G8929 Other chronic pain: Secondary | ICD-10-CM | POA: Insufficient documentation

## 2015-02-26 DIAGNOSIS — Z79899 Other long term (current) drug therapy: Secondary | ICD-10-CM | POA: Diagnosis not present

## 2015-02-26 DIAGNOSIS — R519 Headache, unspecified: Secondary | ICD-10-CM

## 2015-02-26 LAB — PREGNANCY, URINE: PREG TEST UR: POSITIVE — AB

## 2015-02-26 MED ORDER — ACETAMINOPHEN 500 MG PO TABS
1000.0000 mg | ORAL_TABLET | Freq: Once | ORAL | Status: AC
  Administered 2015-02-26: 1000 mg via ORAL
  Filled 2015-02-26: qty 2

## 2015-02-26 MED ORDER — DIPHENHYDRAMINE HCL 50 MG/ML IJ SOLN
25.0000 mg | Freq: Once | INTRAMUSCULAR | Status: AC
  Administered 2015-02-26: 25 mg via INTRAVENOUS
  Filled 2015-02-26: qty 1

## 2015-02-26 MED ORDER — METOCLOPRAMIDE HCL 5 MG/ML IJ SOLN
10.0000 mg | Freq: Once | INTRAMUSCULAR | Status: AC
  Administered 2015-02-26: 10 mg via INTRAVENOUS
  Filled 2015-02-26: qty 2

## 2015-02-26 MED ORDER — SODIUM CHLORIDE 0.9 % IV BOLUS (SEPSIS)
1000.0000 mL | Freq: Once | INTRAVENOUS | Status: AC
  Administered 2015-02-26: 1000 mL via INTRAVENOUS

## 2015-02-26 NOTE — ED Provider Notes (Addendum)
CSN: [id number]     Arrival date & time 02/26/15  2131 History  This chart was scribed for Paula Libra, MD by Gwenyth Ober, ED Scribe. This patient was seen in room MH07/MH07 and the patient's care was started at 11:21 PM.    Chief Complaint  Patient presents with  . Migraine   The history is provided by the patient. No language interpreter was used.   HPI Comments: Nancy Garrett is a 24 y.o. female with a history of recurrent HAs who presents to the Emergency Department complaining of constant, severe, throbbing, frontal HA that started 3 days ago. It is characterized as like previous migraines. She relates nausea, vomiting and mild photophobia as associated symptoms. Patient has only taken her regular Topamax without relief. Pt was not aware that she is currently pregnant. She denies numbness and weakness.  Past Medical History  Diagnosis Date  . Head ache   . Chronic abdominal pain    Past Surgical History  Procedure Laterality Date  . Tonsillectomy     No family history on file. Social History  Substance Use Topics  . Smoking status: Never Smoker   . Smokeless tobacco: None  . Alcohol Use: No   OB History    No data available     Review of Systems 10 Systems reviewed and all are negative for acute change except as noted in the HPI.  Allergies  Ivp dye  Home Medications   Prior to Admission medications   Medication Sig Start Date End Date Taking? Authorizing Provider  topiramate (TOPAMAX) 50 MG tablet Take 50 mg by mouth 2 (two) times daily.   Yes Historical Provider, MD  acetaZOLAMIDE (DIAMOX) 125 MG tablet Take 1 tablet (125 mg total) by mouth 3 (three) times daily. 11/08/14   Mirian Mo, MD  metoCLOPramide (REGLAN) 10 MG tablet Take 1 tablet (10 mg total) by mouth every 6 (six) hours as needed (Headache). 11/08/14   Mirian Mo, MD  metroNIDAZOLE (FLAGYL) 500 MG tablet Take 1 tablet (500 mg total) by mouth 2 (two) times daily. 10/29/14   Hanna  Patel-Mills, PA-C  prochlorperazine (COMPAZINE) 10 MG tablet Take 1 tablet (10 mg total) by mouth 2 (two) times daily as needed for nausea or vomiting. Patient not taking: Reported on 10/29/2014 03/23/14   Margarita Grizzle, MD  SUMAtriptan (IMITREX) 50 MG tablet Take 1 tablet (50 mg total) by mouth every 2 (two) hours as needed for migraine or headache. May repeat in 2 hours if headache persists or recurs. Patient not taking: Reported on 10/29/2014 03/23/14   Margarita Grizzle, MD   BP 120/70 mmHg  Pulse 90  Temp(Src) 98 F (36.7 C) (Oral)  Resp 20  Ht 5\' 6"  (1.676 m)  Wt 281 lb (127.461 kg)  BMI 45.38 kg/m2  SpO2 99%  LMP 01/06/2015   Physical Exam  Nursing note and vitals reviewed. General: Well-developed, well-nourished female in no acute distress; appearance consistent with age of record HENT: normocephalic; atraumatic Eyes: pupils equal, round and reactive to light; extraocular muscles intact Neck: supple Heart: regular rate and rhythm Lungs: clear to auscultation bilaterally Abdomen: soft; nondistended; nontender; bowel sounds present Extremities: No deformity; full range of motion; pulses normal Neurologic: Awake, alert and oriented; motor function intact in all extremities and symmetric; no facial droop; normal coordination and speech; negative Romberg; normal finger-to-nose Skin: Warm and dry Psychiatric: Normal mood and affect  ED Course  Procedures   DIAGNOSTIC STUDIES: Oxygen Saturation is 99% on RA, normal by  my interpretation.    COORDINATION OF CARE: 11:24 PM Discussed treatment plan with pt which includes IV fluids and pt agreed to plan.   MDM   Nursing notes and vitals signs, including pulse oximetry, reviewed.  Summary of this visit's results, reviewed by myself:  Labs:  Results for orders placed or performed during the hospital encounter of 02/26/15 (from the past 24 hour(s))  Pregnancy, urine     Status: Abnormal   Collection Time: 02/26/15  9:43 PM  Result  Value Ref Range   Preg Test, Ur POSITIVE (A) NEGATIVE   12:44 AM Patient feeling much better after IV fluids and medications. Patient was advised that Topamax is possibly unsafe in pregnancy and that she should discuss with her physician whether she should continue.  I personally performed the services described in this documentation, which was scribed in my presence. The recorded information has been reviewed and is accurate.   Paula Libra, MD 02/27/15 [id number]  Paula Libra, MD 02/27/15 [phone]

## 2015-02-26 NOTE — ED Notes (Signed)
Pt is not actively vomiting

## 2015-02-26 NOTE — ED Notes (Signed)
Pt states continues to have headaches and nausea/vomiting. Pt states this headache for 3 days. States neurologist refused to see her unless she paid 300 dollar copay.

## 2015-02-27 MED ORDER — PRENATAL COMPLETE 14-0.4 MG PO TABS: 1.0000 | ORAL_TABLET | Freq: Every day | ORAL | Status: DC

## 2016-05-23 ENCOUNTER — Emergency Department (HOSPITAL_BASED_OUTPATIENT_CLINIC_OR_DEPARTMENT_OTHER)
Admission: EM | Admit: 2016-05-23 | Discharge: 2016-05-23 | Disposition: A | Discharge status: Home / Self Care | Payer: Medicaid Other | Attending: Emergency Medicine | Admitting: Emergency Medicine

## 2016-05-23 ENCOUNTER — Encounter (HOSPITAL_BASED_OUTPATIENT_CLINIC_OR_DEPARTMENT_OTHER): Payer: Self-pay | Admitting: Emergency Medicine

## 2016-05-23 ENCOUNTER — Emergency Department (HOSPITAL_BASED_OUTPATIENT_CLINIC_OR_DEPARTMENT_OTHER): Payer: Medicaid Other

## 2016-05-23 DIAGNOSIS — S63609A Unspecified sprain of unspecified thumb, initial encounter: Secondary | ICD-10-CM | POA: Diagnosis not present

## 2016-05-23 DIAGNOSIS — Y999 Unspecified external cause status: Secondary | ICD-10-CM | POA: Insufficient documentation

## 2016-05-23 DIAGNOSIS — S6991XA Unspecified injury of right wrist, hand and finger(s), initial encounter: Secondary | ICD-10-CM | POA: Diagnosis present

## 2016-05-23 DIAGNOSIS — M546 Pain in thoracic spine: Secondary | ICD-10-CM | POA: Diagnosis not present

## 2016-05-23 DIAGNOSIS — Y9389 Activity, other specified: Secondary | ICD-10-CM | POA: Insufficient documentation

## 2016-05-23 DIAGNOSIS — Y92410 Unspecified street and highway as the place of occurrence of the external cause: Secondary | ICD-10-CM | POA: Diagnosis not present

## 2016-05-23 MED ORDER — IBUPROFEN 400 MG PO TABS
600.0000 mg | ORAL_TABLET | Freq: Once | ORAL | Status: AC
  Administered 2016-05-23: 600 mg via ORAL

## 2016-05-23 MED ORDER — IBUPROFEN 200 MG PO TABS
ORAL_TABLET | ORAL | Status: AC
  Filled 2016-05-23: qty 1

## 2016-05-23 MED ORDER — IBUPROFEN 400 MG PO TABS
ORAL_TABLET | ORAL | Status: AC
  Filled 2016-05-23: qty 1
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
## 2016-05-23 NOTE — Discharge Instructions (Signed)
Please read attached information. If you experience any new or worsening signs or symptoms please return to the emergency room for evaluation. Please follow-up with your primary care provider or specialist as discussed.  °
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
## 2016-07-30 ENCOUNTER — Encounter (HOSPITAL_BASED_OUTPATIENT_CLINIC_OR_DEPARTMENT_OTHER): Payer: Self-pay

## 2016-07-30 ENCOUNTER — Emergency Department (HOSPITAL_BASED_OUTPATIENT_CLINIC_OR_DEPARTMENT_OTHER)
Admission: EM | Admit: 2016-07-30 | Discharge: 2016-07-30 | Disposition: A | Discharge status: Home / Self Care | Payer: Medicaid Other | Attending: Emergency Medicine | Admitting: Emergency Medicine

## 2016-07-30 DIAGNOSIS — J069 Acute upper respiratory infection, unspecified: Secondary | ICD-10-CM | POA: Diagnosis not present

## 2016-07-30 DIAGNOSIS — R05 Cough: Secondary | ICD-10-CM | POA: Diagnosis present

## 2016-07-30 LAB — RAPID STREP SCREEN (MED CTR MEBANE ONLY): Streptococcus, Group A Screen (Direct): NEGATIVE

## 2016-07-30 MED ORDER — PENICILLIN V POTASSIUM 500 MG PO TABS: 500.0000 mg | ORAL_TABLET | Freq: Two times a day (BID) | ORAL | 0 refills | Status: AC

## 2016-07-30 MED ORDER — BENZONATATE 100 MG PO CAPS: 100.0000 mg | ORAL_CAPSULE | Freq: Three times a day (TID) | ORAL | 0 refills | Status: DC

## 2016-07-30 MED ORDER — FLUTICASONE PROPIONATE 50 MCG/ACT NA SUSP
2.0000 | Freq: Every day | NASAL | 0 refills | Status: DC
Start: 2016-07-30 — End: 2016-08-06

## 2016-07-30 NOTE — ED Notes (Signed)
At d/c pt requested/received RTW 2/16 note

## 2016-07-30 NOTE — Discharge Instructions (Signed)
Take penicillin twice daily for 10 days. Make sure to complete full course as prescribed.   1. Medications: flonase, mucinex, tessalon, usual home medications 2. Treatment: rest, drink plenty of fluids, take tylenol or ibuprofen for fever control 3. Follow Up: Please followup with your primary doctor in 3 days for discussion of your diagnoses and further evaluation after today's visit; if you do not have a primary care doctor use the resource guide provided to find one; Return to the ER for high fevers, difficulty breathing or other concerning symptoms   Get help right away if: You have severe or persistent: Headache. Ear pain. Sinus pain. Chest pain. You have chronic lung disease and any of the following: Wheezing. Prolonged cough. Coughing up blood. A change in your usual mucus. You have a stiff neck. You have changes in your: Vision. Hearing. Thinking. Mood.

## 2016-07-30 NOTE — ED Provider Notes (Signed)
[facility]   CSN: [id number] Arrival date & time: 07/30/16  1246     History   Chief Complaint Chief Complaint  Patient presents with  . Cough    HPI Nancy Garrett is a 26 y.o. female presents today for worsening productive cough and sore throat for 3 days. She reports associated pain on the back of her neck and back, and nausea the first day, fever up up 101, chills. Patient denies vomiting, abdominal pain. Patient reports trying mucinex, cough drops, tylenol, hot tea with no relief. She denies hx of COPD, asthma, smoking, recent travel. Denies recent sick contacts.  The history is provided by the patient. No language interpreter was used.  Cough     Past Medical History:  Diagnosis Date  . Chronic abdominal pain   . Head ache     Patient Active Problem List   Diagnosis Date Noted  . Obesity, Class III, BMI 40-49.9 (morbid obesity) (HCC) 07/27/2013    Past Surgical History:  Procedure Laterality Date  . TONSILLECTOMY      OB History    No data available       Home Medications    Prior to Admission medications   Medication Sig Start Date End Date Taking? Authorizing Provider  benzonatate (TESSALON) 100 MG capsule Take 1 capsule (100 mg total) by mouth every 8 (eight) hours. 07/30/16   Shelah Heatley Manuel [place], [place]  fluticasone (FLONASE) 50 MCG/ACT nasal spray Place 2 sprays into both nostrils daily. 07/30/16   Antwonette Feliz Manuel [place], [place]  penicillin v potassium (VEETID) 500 MG tablet Take 1 tablet (500 mg total) by mouth 2 (two) times daily. 07/30/16 08/09/16  Alvina ChouFrancisco Manuel Mikela [place], [place]    Family History No family history on file.  Social History Social History  Substance Use Topics  . Smoking status: Never Smoker  . Smokeless tobacco: Never Used  . Alcohol use No     Allergies   Ivp dye [iodinated diagnostic agents]   Review of Systems Review of Systems  Constitutional: Positive for fever.  Respiratory: Positive for  cough.      Physical Exam Updated Vital Signs BP 130/88 (BP Location: Left Arm)   Pulse 113   Temp 98.8 F (37.1 C) (Oral)   Resp 22   Ht 5\' 6"  (1.676 m)   Wt 135.6 kg   SpO2 100%   BMI 48.26 kg/m   Physical Exam  Constitutional: She is oriented to person, place, and time. She appears well-developed and well-nourished.  Well appearing  HENT:  Head: Normocephalic and atraumatic.  Right Ear: External ear normal.  Left Ear: External ear normal.  Nose: Nose normal.  Mouth/Throat: Oropharynx is clear and moist. No oropharyngeal exudate.  Oropharynx with mild evidence of redness and exudates. No Tonsils noted. Hx of tonsillectomy.  TM's appear normal with no evidence of bulging. EAC appear non erythematous and not swollen  Eyes: EOM are normal. Pupils are equal, round, and reactive to light.  Neck: Normal range of motion.  Normal ROM. No nuchal rigidity.   Cardiovascular: Normal rate and normal heart sounds.   Pulmonary/Chest: Effort normal and breath sounds normal. No respiratory distress. She has no wheezes. She has no rales.  Lungs CTA. No wheezing. No rales. No stridor. Normal work of breathing  Abdominal: Soft. There is no tenderness. There is no rebound and no guarding.  Soft and nontender. No rebound. No guarding. Negative murphy's sign. No focal tenderness at McBurney's point. No CVA tenderness.  No evidence of hernia  Neurological: She is alert and oriented to person, place, and time.  Skin: Skin is warm.  Psychiatric: She has a normal mood and affect. Her behavior is normal.  Nursing note and vitals reviewed.    ED Treatments / Results  Labs (all labs ordered are listed, but only abnormal results are displayed) Labs Reviewed  RAPID STREP SCREEN (NOT AT [facility])  CULTURE, GROUP A STREP [facility])    EKG  EKG Interpretation None       Radiology No results found.  Procedures Procedures (including critical care time)  Medications Ordered in ED Medications - No  data to display   Initial Impression / Assessment and Plan / ED Course  I have reviewed the triage vital signs and the nursing notes.  Pertinent labs & imaging results that were available during my care of the patient were reviewed by me and considered in my medical decision making (see chart for details).    Patient concerning for upper respiratory infection and possible strep throat. Presentation not concerning for peritonsillar abscess or spread of infection to deep spaces of the throat; patent airway. On exam, pt in NAD. VSS. No hypoxia. Afebrile. Tolerating secretions well. Lungs clear, Heart sounds clear. Normal work of breathing. TMs clear. Patient does not have tonsils, history of tonsillectomy. However oropharynx does seem mild to moderate redness with some visual exudates.. Abdomen nontender/soft.  Strep test is negative, however still have suspicion of strept throat from exam. Will give patient appropriate antibiotics. Pt will be discharged with symptomatic treatment.  Verbalizes understanding and is agreeable with plan. Pt is hemodynamically stable & in NAD prior to dc.  Specific return precautions discussed. Recommended PCP follow-up in 3-5 days regarding today's visit.   Final Clinical Impressions(s) / ED Diagnoses   Final diagnoses:  Upper respiratory tract infection, unspecified type    New Prescriptions Discharge Medication List as of 07/30/2016  2:02 PM    START taking these medications   Details  benzonatate (TESSALON) 100 MG capsule Take 1 capsule (100 mg total) by mouth every 8 (eight) hours., Starting Thu 07/30/2016, Print    fluticasone (FLONASE) 50 MCG/ACT nasal spray Place 2 sprays into both nostrils daily., Starting Thu 07/30/2016, Print    penicillin v potassium (VEETID) 500 MG tablet Take 1 tablet (500 mg total) by mouth 2 (two) times daily., Starting Thu 07/30/2016, Until Sun 08/09/2016, Print         [address] [place], [place] 07/30/16 1654    Benjiman Core, MD 07/31/16 [phone]

## 2016-07-30 NOTE — ED Triage Notes (Signed)
C/o flu like s/s x 3 days-pain to back and neck x 3 days-NAD-steady gait

## 2016-08-02 LAB — CULTURE, GROUP A STREP (THRC)

## 2016-08-06 ENCOUNTER — Encounter (HOSPITAL_BASED_OUTPATIENT_CLINIC_OR_DEPARTMENT_OTHER): Payer: Self-pay | Admitting: *Deleted

## 2016-08-06 ENCOUNTER — Emergency Department (HOSPITAL_BASED_OUTPATIENT_CLINIC_OR_DEPARTMENT_OTHER)
Admission: EM | Admit: 2016-08-06 | Discharge: 2016-08-06 | Disposition: A | Discharge status: Home / Self Care | Payer: Medicaid Other | Attending: Emergency Medicine | Admitting: Emergency Medicine

## 2016-08-06 DIAGNOSIS — J019 Acute sinusitis, unspecified: Secondary | ICD-10-CM | POA: Diagnosis not present

## 2016-08-06 DIAGNOSIS — Z79899 Other long term (current) drug therapy: Secondary | ICD-10-CM | POA: Diagnosis not present

## 2016-08-06 DIAGNOSIS — R0981 Nasal congestion: Secondary | ICD-10-CM | POA: Diagnosis present

## 2016-08-06 MED ORDER — AZITHROMYCIN 250 MG PO TABS: ORAL_TABLET | ORAL | 0 refills | Status: DC

## 2016-08-06 MED ORDER — IPRATROPIUM BROMIDE 0.06 % NA SOLN: 2.0000 | Freq: Four times a day (QID) | NASAL | 12 refills | Status: DC

## 2016-08-06 NOTE — Discharge Instructions (Signed)
Follow these instructions at home: Medicines Take, use, or apply over-the-counter and prescription medicines only as told by your health care provider. These may include nasal sprays. If you were prescribed an antibiotic medicine, take it as told by your health care provider. Do not stop taking the antibiotic even if you start to feel better. Hydrate and Humidify Drink enough water to keep your urine clear or pale yellow. Staying hydrated will help to thin your mucus. Use a cool mist humidifier to keep the humidity level in your home above 50%. Inhale steam for 10-15 minutes, 3-4 times a day or as told by your health care provider. You can do this in the bathroom while a hot shower is running. Limit your exposure to cool or dry air. Rest Rest as much as possible. Sleep with your head raised (elevated). Make sure to get enough sleep each night. General instructions Apply a warm, moist washcloth to your face 3-4 times a day or as told by your health care provider. This will help with discomfort. Wash your hands often with soap and water to reduce your exposure to viruses and other germs. If soap and water are not available, use hand sanitizer. Do not smoke. Avoid being around people who are smoking (secondhand smoke). Keep all follow-up visits as told by your health care provider. This is important. Contact a health care provider if: You have a fever. Your symptoms get worse. Your symptoms do not improve within 10 days. Get help right away if: You have a severe headache. You have persistent vomiting. You have pain or swelling around your face or eyes. You have vision problems. You develop confusion. Your neck is stiff. You have trouble breathing.

## 2016-08-06 NOTE — ED Provider Notes (Signed)
[facility]   CSN: [id number] Arrival date & time: 08/06/16  0957     History   Chief Complaint Chief Complaint  Patient presents with  . Nasal Congestion    HPI Nancy Garrett is a 26 y.o. female.  Patient was treated previously with ABX. 2 weeks of sxs- sore throat, horse of voice, sinus pressure. Eye watering,. Mild headache and neck pain.   Now coughing.  No fevers Took Pen V, tessalon and nasal spray + mucinex, tylenol cold and sinus.  No improvement  HPI  Past Medical History:  Diagnosis Date  . Chronic abdominal pain   . Head ache     Patient Active Problem List   Diagnosis Date Noted  . Obesity, Class III, BMI 40-49.9 (morbid obesity) (HCC) 07/27/2013    Past Surgical History:  Procedure Laterality Date  . TONSILLECTOMY      OB History    No data available       Home Medications    Prior to Admission medications   Medication Sig Start Date End Date Taking? Authorizing Provider  benzonatate (TESSALON) 100 MG capsule Take 1 capsule (100 mg total) by mouth every 8 (eight) hours. 07/30/16  Yes Francisco Manuel [place], [place]  fluticasone (FLONASE) 50 MCG/ACT nasal spray Place 2 sprays into both nostrils daily. 07/30/16  Yes [address] [place], [place]  penicillin v potassium (VEETID) 500 MG tablet Take 1 tablet (500 mg total) by mouth 2 (two) times daily. 07/30/16 08/09/16 Yes Francisco Orson AloeManuel [place], [place]    Family History No family history on file.  Social History Social History  Substance Use Topics  . Smoking status: Never Smoker  . Smokeless tobacco: Never Used  . Alcohol use No     Allergies   Ivp dye [iodinated diagnostic agents]   Review of Systems Review of Systems   Physical Exam Updated Vital Signs BP 120/75 (BP Location: Left Arm)   Pulse 85   Temp 99.1 F (37.3 C) (Oral)   Resp 18   Ht 5\' 6"  (1.676 m)   Wt 136.1 kg   SpO2 99%   BMI 48.42 kg/m   Physical Exam  Appears moderately ill but not  toxic; temperature as noted in vitals. Ears right TM normal, left TM with effusion Eyes:glassy appearance, no discharge  Heart: RRR, NO M/G/R Nose with erythematous and swelling membranes. Throat-oropharyngeal erythema with cobblestoning.   Neck supple. No adenopathyhy in the neck.  Sinuses non tender.  The chest is clear. Abdomen is soft and nontender  ED Treatments / Results  Labs (all labs ordered are listed, but only abnormal results are displayed) Labs Reviewed - No data to display  EKG  EKG Interpretation None       Radiology No results found.  Procedures Procedures (including critical care time)  Medications Ordered in ED Medications - No data to display   Initial Impression / Assessment and Plan / ED Course  I have reviewed the triage vital signs and the nursing notes.  Pertinent labs & imaging results that were available during my care of the patient were reviewed by me and considered in my medical decision making (see chart for details).     . Patients symptoms are consistent with URI, likely viral etiology.  Pt will be discharged with symptomatic treatment.  Verbalizes understanding and is agreeable with plan. Pt is hemodynamically stable & in NAD prior to dc.   Final Clinical Impressions(s) / ED Diagnoses   Final diagnoses:  Acute non-recurrent sinusitis, unspecified location    New Prescriptions New Prescriptions   No medications on file     Arthor Captain, PA-C 08/07/16 0825    Gwyneth Sprout, MD 08/07/16 [phone]

## 2016-08-06 NOTE — ED Triage Notes (Signed)
Pt states she still feels bad. Seen last week for respiratory illness and given antibiotic. C/o congestion, sinus pressure and headache

## 2016-10-27 ENCOUNTER — Emergency Department (HOSPITAL_BASED_OUTPATIENT_CLINIC_OR_DEPARTMENT_OTHER)
Admission: EM | Admit: 2016-10-27 | Discharge: 2016-10-27 | Disposition: A | Discharge status: Home / Self Care | Payer: Medicaid Other | Attending: Emergency Medicine | Admitting: Emergency Medicine

## 2016-10-27 ENCOUNTER — Encounter (HOSPITAL_BASED_OUTPATIENT_CLINIC_OR_DEPARTMENT_OTHER): Payer: Self-pay

## 2016-10-27 DIAGNOSIS — N764 Abscess of vulva: Secondary | ICD-10-CM | POA: Diagnosis not present

## 2016-10-27 DIAGNOSIS — R102 Pelvic and perineal pain: Secondary | ICD-10-CM | POA: Diagnosis present

## 2016-10-27 MED ORDER — CEPHALEXIN 500 MG PO CAPS: 500.0000 mg | ORAL_CAPSULE | Freq: Four times a day (QID) | ORAL | 0 refills | Status: DC

## 2016-10-27 MED ORDER — SULFAMETHOXAZOLE-TRIMETHOPRIM 800-160 MG PO TABS: 1.0000 | ORAL_TABLET | Freq: Two times a day (BID) | ORAL | 0 refills | Status: AC

## 2016-10-27 NOTE — ED Provider Notes (Signed)
[facility] Provider Note   CSN: [id number] Arrival date & time: 10/27/16  1657   By signing my name below, I, Nancy Garrett, attest that this documentation has been prepared under the direction and in the presence of Nancy Core, MD. Electronically Signed: Thelma Garrett, Scribe. 10/27/16. 7:58 PM.   History   Chief Complaint Chief Complaint  Patient presents with  . Recurrent Skin Infections   The history is provided by the patient. No language interpreter was used.   HPI Comments: Nancy Garrett is a 26 y.o. female who presents to the Emergency Department complaining of acute, rapidly worsening pain to her vagina that began 2x days ago. She states it is on the left side of our outer vaginal lip. She describes it as a fluid-filled mass. She states that while she was in the ED, it burst, and the pain somewhat resolved. She denies fever, chills, possible pregnancy, vaginal bleeding or discharge, CP, SOB, and rashes.   Past Medical History:  Diagnosis Date  . Chronic abdominal pain   . Head ache     Patient Active Problem List   Diagnosis Date Noted  . Obesity, Class III, BMI 40-49.9 (morbid obesity) (HCC) 07/27/2013    Past Surgical History:  Procedure Laterality Date  . TONSILLECTOMY      OB History    No data available       Home Medications    Prior to Admission medications   Medication Sig Start Date End Date Taking? Authorizing Provider  cephALEXin (KEFLEX) 500 MG capsule Take 1 capsule (500 mg total) by mouth 4 (four) times daily. 10/27/16   Nancy Core, MD  sulfamethoxazole-trimethoprim (BACTRIM DS,SEPTRA DS) 800-160 MG tablet Take 1 tablet by mouth 2 (two) times daily. 10/27/16 11/03/16  Nancy Core, MD    Family History No family history on file.  Social History Social History  Substance Use Topics  . Smoking status: Never Smoker  . Smokeless tobacco: Never Used  . Alcohol use No     Allergies   Ivp dye [iodinated  diagnostic agents]   Review of Systems Review of Systems  Constitutional: Negative for chills and fever.  HENT: Negative for ear pain and sore throat.   Eyes: Negative for pain and visual disturbance.  Respiratory: Negative for cough and shortness of breath.   Cardiovascular: Negative for chest pain and palpitations.  Gastrointestinal: Negative for abdominal pain and vomiting.  Genitourinary: Negative for vaginal bleeding and vaginal discharge.  Musculoskeletal: Negative for arthralgias and back pain.  Skin: Positive for wound. Negative for color change and rash.  Neurological: Negative for syncope.  All other systems reviewed and are negative.    Physical Exam Updated Vital Signs BP 105/73 (BP Location: Right Arm)   Pulse 84   Temp 98.3 F (36.8 C) (Oral)   Resp 18   Ht 5\' 6"  (1.676 m)   Wt 300 lb (136.1 kg)   SpO2 100%   BMI 48.42 kg/m   Physical Exam  Constitutional: She is oriented to person, place, and time. She appears well-developed and well-nourished.  HENT:  Head: Normocephalic and atraumatic.  Cardiovascular: Normal rate and regular rhythm.   Pulmonary/Chest: Effort normal and breath sounds normal.  Abdominal: Soft. Bowel sounds are normal.  Genitourinary:  Genitourinary Comments: Right labia majora on the skin side has some purulent drainage. Some fluctuance that decreases after pus expressed. Some mild swelling with mild induration.  Neurological: She is alert and oriented to person, place, and time.  Skin:  Skin is warm and dry.  Psychiatric: She has a normal mood and affect.  Nursing note and vitals reviewed.    ED Treatments / Results  DIAGNOSTIC STUDIES: Oxygen Saturation is 100% on RA, normal by my interpretation.    COORDINATION OF CARE: 7:14 PM Discussed treatment plan with pt at bedside and pt agreed to plan. Labs (all labs ordered are listed, but only abnormal results are displayed) Labs Reviewed - No data to display  EKG  EKG  Interpretation None       Radiology No results found.  Procedures Procedures (including critical care time)  Medications Ordered in ED Medications - No data to display   Initial Impression / Assessment and Plan / ED Course  I have reviewed the triage vital signs and the nursing notes.  Pertinent labs & imaging results that were available during my care of the patient were reviewed by me and considered in my medical decision making (see chart for details).     Patient with skin abscess right labia majora. Bartholin's felt less likely. Freely draining. Mild surrounding cellulitis and will give some antibiotics. Discharge home. Well-appearing.  Final Clinical Impressions(s) / ED Diagnoses   Final diagnoses:  Abscess of labia majora    New Prescriptions New Prescriptions   CEPHALEXIN (KEFLEX) 500 MG CAPSULE    Take 1 capsule (500 mg total) by mouth 4 (four) times daily.   SULFAMETHOXAZOLE-TRIMETHOPRIM (BACTRIM DS,SEPTRA DS) 800-160 MG TABLET    Take 1 tablet by mouth 2 (two) times daily.  I personally performed the services described in this documentation, which was scribed in my presence. The recorded information has been reviewed and is accurate.       Nancy CorePickering, Batul Diego, MD 10/27/16 2008

## 2016-10-27 NOTE — Discharge Instructions (Signed)
Use warm soaks to help it drain. watch for increasing pain or swelling. Follow-up with your doctor or GYN for further monitoring.

## 2016-10-27 NOTE — ED Triage Notes (Signed)
C/o "boil to my vagina" x 2 days-NAD-steady gait

## 2016-10-27 NOTE — ED Notes (Signed)
ED Provider at bedside. 

## 2017-06-15 HISTORY — PX: GASTRECTOMY: SHX58

## 2017-08-24 HISTORY — PX: LAPAROSCOPIC GASTRIC SLEEVE RESECTION: SHX5895

## 2020-08-04 ENCOUNTER — Encounter (HOSPITAL_COMMUNITY): Payer: Self-pay | Admitting: Emergency Medicine

## 2020-08-04 ENCOUNTER — Emergency Department (HOSPITAL_COMMUNITY)
Admission: EM | Admit: 2020-08-04 | Discharge: 2020-08-05 | Disposition: A | Discharge status: Home / Self Care | Payer: Self-pay | Attending: Emergency Medicine | Admitting: Emergency Medicine

## 2020-08-04 ENCOUNTER — Other Ambulatory Visit: Payer: Self-pay

## 2020-08-04 DIAGNOSIS — H538 Other visual disturbances: Secondary | ICD-10-CM | POA: Insufficient documentation

## 2020-08-04 DIAGNOSIS — R112 Nausea with vomiting, unspecified: Secondary | ICD-10-CM | POA: Insufficient documentation

## 2020-08-04 DIAGNOSIS — R519 Headache, unspecified: Secondary | ICD-10-CM | POA: Insufficient documentation

## 2020-08-04 DIAGNOSIS — Z5321 Procedure and treatment not carried out due to patient leaving prior to being seen by health care provider: Secondary | ICD-10-CM | POA: Insufficient documentation

## 2020-08-04 MED ORDER — ONDANSETRON 4 MG PO TBDP
4.0000 mg | ORAL_TABLET | Freq: Once | ORAL | Status: DC | PRN
  Filled 2020-08-04: qty 1

## 2020-08-04 NOTE — ED Triage Notes (Signed)
Pt reports a headache w/ blurry vision X3 days.  Pt also experiencing nausea/vomiting, light/sound sensitivity.  Pt reports she has not had a migraine in three years.  No neuro deficits

## 2020-08-05 ENCOUNTER — Emergency Department (HOSPITAL_BASED_OUTPATIENT_CLINIC_OR_DEPARTMENT_OTHER)
Admission: EM | Admit: 2020-08-05 | Discharge: 2020-08-05 | Disposition: A | Discharge status: Home / Self Care | Payer: Self-pay | Attending: Emergency Medicine | Admitting: Emergency Medicine

## 2020-08-05 ENCOUNTER — Encounter (HOSPITAL_BASED_OUTPATIENT_CLINIC_OR_DEPARTMENT_OTHER): Payer: Self-pay

## 2020-08-05 DIAGNOSIS — G43109 Migraine with aura, not intractable, without status migrainosus: Secondary | ICD-10-CM

## 2020-08-05 DIAGNOSIS — G43009 Migraine without aura, not intractable, without status migrainosus: Secondary | ICD-10-CM | POA: Insufficient documentation

## 2020-08-05 LAB — CBC WITH DIFFERENTIAL/PLATELET
Abs Immature Granulocytes: 0.01 10*3/uL (ref 0.00–0.07)
Basophils Absolute: 0 10*3/uL (ref 0.0–0.1)
Basophils Relative: 0 %
Eosinophils Absolute: 0 10*3/uL (ref 0.0–0.5)
Eosinophils Relative: 0 %
HCT: 47.4 % — ABNORMAL HIGH (ref 36.0–46.0)
Hemoglobin: 15.4 g/dL — ABNORMAL HIGH (ref 12.0–15.0)
Immature Granulocytes: 0 %
Lymphocytes Relative: 52 %
Lymphs Abs: 2.5 10*3/uL (ref 0.7–4.0)
MCH: 29 pg (ref 26.0–34.0)
MCHC: 32.5 g/dL (ref 30.0–36.0)
MCV: 89.3 fL (ref 80.0–100.0)
Monocytes Absolute: 0.3 10*3/uL (ref 0.1–1.0)
Monocytes Relative: 5 %
Neutro Abs: 2.1 10*3/uL (ref 1.7–7.7)
Neutrophils Relative %: 43 %
Platelets: 167 10*3/uL (ref 150–400)
RBC: 5.31 MIL/uL — ABNORMAL HIGH (ref 3.87–5.11)
RDW: 12.4 % (ref 11.5–15.5)
WBC: 4.8 10*3/uL (ref 4.0–10.5)
nRBC: 0 % (ref 0.0–0.2)

## 2020-08-05 LAB — BASIC METABOLIC PANEL
Anion gap: 8 (ref 5–15)
BUN: 12 mg/dL (ref 6–20)
CO2: 28 mmol/L (ref 22–32)
Calcium: 9.3 mg/dL (ref 8.9–10.3)
Chloride: 104 mmol/L (ref 98–111)
Creatinine, Ser: 0.66 mg/dL (ref 0.44–1.00)
GFR, Estimated: >60 mL/min (ref >60)
Glucose, Bld: 99 mg/dL (ref 70–99)
Potassium: 4.1 mmol/L (ref 3.5–5.1)
Sodium: 140 mmol/L (ref 135–145)

## 2020-08-05 LAB — PREGNANCY, URINE: Preg Test, Ur: NEGATIVE

## 2020-08-05 MED ORDER — PROCHLORPERAZINE EDISYLATE 10 MG/2ML IJ SOLN
10.0000 mg | Freq: Once | INTRAMUSCULAR | Status: AC
  Administered 2020-08-05: 10 mg via INTRAVENOUS
  Filled 2020-08-05: qty 2

## 2020-08-05 MED ORDER — KETOROLAC TROMETHAMINE 30 MG/ML IJ SOLN
30.0000 mg | Freq: Once | INTRAMUSCULAR | Status: AC
  Administered 2020-08-05: 30 mg via INTRAVENOUS
  Filled 2020-08-05: qty 1

## 2020-08-05 MED ORDER — DEXAMETHASONE SODIUM PHOSPHATE 10 MG/ML IJ SOLN
10.0000 mg | Freq: Once | INTRAMUSCULAR | Status: AC
  Administered 2020-08-05: 10 mg via INTRAVENOUS
  Filled 2020-08-05: qty 1

## 2020-08-05 MED ORDER — DIPHENHYDRAMINE HCL 50 MG/ML IJ SOLN
25.0000 mg | Freq: Once | INTRAMUSCULAR | Status: AC
  Administered 2020-08-05: 25 mg via INTRAVENOUS
  Filled 2020-08-05: qty 1

## 2020-08-05 MED ORDER — SODIUM CHLORIDE 0.9 % IV BOLUS
1000.0000 mL | Freq: Once | INTRAVENOUS | Status: AC
Start: 2020-08-05 — End: 2020-08-05
  Administered 2020-08-05: 1000 mL via INTRAVENOUS

## 2020-08-05 NOTE — ED Triage Notes (Signed)
Pt has hx of migraines, hasnt had one in 3 years since gastric sleeve procedure. Been having migraine w/ nausea/vomiting/light sensitivity for the past week. Was at [facility] yesterday, left AMA d/t long wait

## 2020-08-05 NOTE — ED Notes (Signed)
Pt called for vitals no answer Pt left labels and bp cuff on desk not seen in lobby.

## 2020-08-05 NOTE — ED Provider Notes (Signed)
[facility] EMERGENCY DEPARTMENT Provider Note   CSN: [id number] Arrival date & time: 08/05/20  0998     History Chief Complaint  Patient presents with  . Migraine    Nancy Lecy is a 30 Garrett.o. female.  Patient with history of migraines.  Has been fairly controlled for the last couple years ever since she had a gastric sleeve but headaches been worse the last several days and not able to sleep.  Has tried some over-the-counter medications with minimal relief.  Denies any numbness or weakness or speech changes.  Has zigzag/scotomas in her eyes.  But no vision vision loss.  Feels pretty similar to bad migraine she had in the past.  The history is provided by the patient.  Migraine This is a new problem. The current episode started more than 2 days ago. The problem occurs daily. Associated symptoms include headaches. Pertinent negatives include no chest pain, no abdominal pain and no shortness of breath. Nothing aggravates the symptoms. Nothing relieves the symptoms. She has tried nothing for the symptoms. The treatment provided no relief.       Past Medical History:  Diagnosis Date  . Chronic abdominal pain   . Head ache     Patient Active Problem List   Diagnosis Date Noted  . Obesity, Class III, BMI 40-49.9 (morbid obesity) (HCC) 07/27/2013    Past Surgical History:  Procedure Laterality Date  . LAPAROSCOPIC GASTRIC SLEEVE RESECTION    . TONSILLECTOMY       OB History   No obstetric history on file.     No family history on file.  Social History   Tobacco Use  . Smoking status: Never Smoker  . Smokeless tobacco: Never Used  Substance Use Topics  . Alcohol use: No  . Drug use: No    Home Medications Prior to Admission medications   Medication Sig Start Date End Date Taking? Authorizing Provider  cephALEXin (KEFLEX) 500 MG capsule Take 1 capsule (500 mg total) by mouth 4 (four) times daily. 10/27/16   Benjiman Core, MD    Allergies     Ivp dye [iodinated diagnostic agents]  Review of Systems   Review of Systems  Constitutional: Negative for chills and fever.  HENT: Negative for ear pain and sore throat.   Eyes: Positive for photophobia and visual disturbance. Negative for pain.  Respiratory: Negative for cough and shortness of breath.   Cardiovascular: Negative for chest pain and palpitations.  Gastrointestinal: Positive for nausea and vomiting. Negative for abdominal pain.  Genitourinary: Negative for dysuria and hematuria.  Musculoskeletal: Negative for arthralgias and back pain.  Skin: Negative for color change and rash.  Neurological: Positive for headaches. Negative for dizziness, tremors, seizures, syncope, facial asymmetry, speech difficulty, weakness, light-headedness and numbness.  All other systems reviewed and are negative.   Physical Exam Updated Vital Signs  ED Triage Vitals  Enc Vitals Group     BP 08/05/20 0937 (!) 113/93     Pulse Rate 08/05/20 0937 87     Resp 08/05/20 0937 18     Temp 08/05/20 0937 98.1 F (36.7 C)     Temp Source 08/05/20 0937 Oral     SpO2 08/05/20 0937 100 %     Weight 08/05/20 0939 195 lb (88.5 kg)     Height 08/05/20 0939 5\' 6"  (1.676 m)     Head Circumference --      Peak Flow --      Pain Score 08/05/20 0938 9  Pain Loc --      Pain Edu? --      Excl. in GC? --     Physical Exam Vitals and nursing note reviewed.  Constitutional:      General: She is not in acute distress.    Appearance: She is well-developed and well-nourished. She is not ill-appearing.  HENT:     Head: Normocephalic and atraumatic.     Nose: Nose normal.     Mouth/Throat:     Mouth: Mucous membranes are moist.  Eyes:     Extraocular Movements: Extraocular movements intact.     Conjunctiva/sclera: Conjunctivae normal.     Pupils: Pupils are equal, round, and reactive to light.  Cardiovascular:     Rate and Rhythm: Normal rate and regular rhythm.     Pulses: Normal pulses.      Heart sounds: Normal heart sounds. No murmur heard.   Pulmonary:     Effort: Pulmonary effort is normal. No respiratory distress.     Breath sounds: Normal breath sounds.  Abdominal:     Palpations: Abdomen is soft.     Tenderness: There is no abdominal tenderness.  Musculoskeletal:        General: No edema. Normal range of motion.     Cervical back: Normal range of motion and neck supple. No tenderness.  Skin:    General: Skin is warm and dry.     Capillary Refill: Capillary refill takes less than 2 seconds.  Neurological:     General: No focal deficit present.     Mental Status: She is alert and oriented to person, place, and time.     Cranial Nerves: No cranial nerve deficit.     Sensory: No sensory deficit.     Motor: No weakness.     Coordination: Coordination normal.     Comments: 5+ out of 5 strength throughout, normal sensation, no drift, normal finger-to-nose finger, normal speech, no visual field deficit  Psychiatric:        Mood and Affect: Mood and affect normal.     ED Results / Procedures / Treatments   Labs (all labs ordered are listed, but only abnormal results are displayed) Labs Reviewed  CBC WITH DIFFERENTIAL/PLATELET - Abnormal; Notable for the following components:      Result Value   RBC 5.31 (*)    Hemoglobin 15.4 (*)    HCT 47.4 (*)    All other components within normal limits  BASIC METABOLIC PANEL  PREGNANCY, URINE    EKG None  Radiology No results found.  Procedures Procedures   Medications Ordered in ED Medications  sodium chloride 0.9 % bolus 1,000 mL (0 mLs Intravenous Stopped 08/05/20 1213)  prochlorperazine (COMPAZINE) injection 10 mg (10 mg Intravenous Given 08/05/20 0953)  diphenhydrAMINE (BENADRYL) injection 25 mg (25 mg Intravenous Given 08/05/20 0953)  ketorolac (TORADOL) 30 MG/ML injection 30 mg (30 mg Intravenous Given 08/05/20 1208)  dexamethasone (DECADRON) injection 10 mg (10 mg Intravenous Given 08/05/20 1207)    ED  Course  I have reviewed the triage vital signs and the nursing notes.  Pertinent labs & imaging results that were available during my care of the patient were reviewed by me and considered in my medical decision making (see chart for details).    MDM Rules/Calculators/A&P                          Edessa Jakubowicz is a 30 year old female who presents the  ED with migraines.  History of the same.  Normal vitals.  No fever.  Has not had migraines in several years ever since gastric sleeve.  Used to follow with neurology and be on Topamax.  She no longer takes any preventative headache medication.  Does not follow with neurology any longer.  Seems typical of her bad migraines in the past.  Has been ongoing for the last several days.  Not getting much sleep.  Having nausea, vomiting, light sensitivity, scotomas.  Neurologic exam is normal.  No concern for head bleed or stroke or other intracranial process.  No concern for meningitis.  Will give fluid bolus and IV headache cocktail and reevaluate.  Lab work shows no significant anemia, electrolyte abnormality, kidney injury.  Pregnancy test negative.  Patient feeling much improved after IV headache cocktail.  Will refer to neurology again as she may benefit from new evaluation and medication discussion.  No concern for stroke or other intracranial process such as meningitis.  No concern for head bleed.  Discharged in good condition.  This chart was dictated using voice recognition software.  Despite best efforts to proofread,  errors can occur which can change the documentation meaning.    Final Clinical Impression(s) / ED Diagnoses Final diagnoses:  Migraine with aura and without status migrainosus, not intractable    Rx / DC Orders ED Discharge Orders    None       Virgina Norfolk, DO 08/05/20 1218

## 2021-09-22 ENCOUNTER — Ambulatory Visit (INDEPENDENT_AMBULATORY_CARE_PROVIDER_SITE_OTHER): Payer: PRIVATE HEALTH INSURANCE | Admitting: Family

## 2021-09-22 ENCOUNTER — Encounter (HOSPITAL_BASED_OUTPATIENT_CLINIC_OR_DEPARTMENT_OTHER): Payer: Self-pay

## 2021-09-22 ENCOUNTER — Encounter: Payer: Self-pay | Admitting: Family

## 2021-09-22 ENCOUNTER — Ambulatory Visit
Admission: RE | Admit: 2021-09-22 | Discharge: 2021-09-22 | Disposition: A | Discharge status: Home / Self Care | Payer: PRIVATE HEALTH INSURANCE | Source: Ambulatory Visit | Attending: Family | Admitting: Family

## 2021-09-22 VITALS — BP 120/88 | HR 89 | Temp 97.9°F | Resp 16 | Ht 66.0 in | Wt 222.0 lb

## 2021-09-22 DIAGNOSIS — M545 Low back pain, unspecified: Secondary | ICD-10-CM

## 2021-09-22 DIAGNOSIS — R5383 Other fatigue: Secondary | ICD-10-CM | POA: Diagnosis not present

## 2021-09-22 DIAGNOSIS — Z13220 Encounter for screening for lipoid disorders: Secondary | ICD-10-CM

## 2021-09-22 DIAGNOSIS — G8929 Other chronic pain: Secondary | ICD-10-CM

## 2021-09-22 DIAGNOSIS — Z6835 Body mass index (BMI) 35.0-35.9, adult: Secondary | ICD-10-CM

## 2021-09-22 DIAGNOSIS — K5904 Chronic idiopathic constipation: Secondary | ICD-10-CM | POA: Diagnosis not present

## 2021-09-22 DIAGNOSIS — Z7689 Persons encountering health services in other specified circumstances: Secondary | ICD-10-CM | POA: Diagnosis not present

## 2021-09-22 DIAGNOSIS — Z23 Encounter for immunization: Secondary | ICD-10-CM

## 2021-09-22 MED ORDER — TETANUS-DIPHTH-ACELL PERTUSSIS 5-2.5-18.5 LF-MCG/0.5 IM SUSP: 0.5000 mL | Freq: Once | INTRAMUSCULAR | 0 refills | Status: AC

## 2021-09-22 NOTE — Patient Instructions (Addendum)
-   Please get Lower back X-ray at [facility] imaging at [facility] then will call you with results. ? ?- Please get Tetanus vaccine at your pharmacy  ? ?Constipation, Adult ?Constipation is when a person has fewer than three bowel movements in a week, has difficulty having a bowel movement, or has stools (feces) that are dry, hard, or larger than normal. Constipation may be caused by an underlying condition. It may become worse with age if a person takes certain medicines and does not take in enough fluids. ?Follow these instructions at home: ?Eating and drinking ? ?Eat foods that have a lot of fiber, such as beans, whole grains, and fresh fruits and vegetables. ?Limit foods that are low in fiber and high in fat and processed sugars, such as fried or sweet foods. These include french fries, hamburgers, cookies, candies, and soda. ?Drink enough fluid to keep your urine pale yellow. ?General instructions ?Exercise regularly or as told by your health care provider. Try to do 150 minutes of moderate exercise each week. ?Use the bathroom when you have the urge to go. Do not hold it in. ?Take over-the-counter and prescription medicines only as told by your health care provider. This includes any fiber supplements. ?During bowel movements: ?Practice deep breathing while relaxing the lower abdomen. ?Practice pelvic floor relaxation. ?Watch your condition for any changes. Let your health care provider know about them. ?Keep all follow-up visits as told by your health care provider. This is important. ?Contact a health care provider if: ?You have pain that gets worse. ?You have a fever. ?You do not have a bowel movement after 4 days. ?You vomit. ?You are not hungry or you lose weight. ?You are bleeding from the opening between the buttocks (anus). ?You have thin, pencil-like stools. ?Get help right away if: ?You have a fever and your symptoms suddenly get worse. ?You leak stool or have blood in your stool. ?Your  abdomen is bloated. ?You have severe pain in your abdomen. ?You feel dizzy or you faint. ?Summary ?Constipation is when a person has fewer than three bowel movements in a week, has difficulty having a bowel movement, or has stools (feces) that are dry, hard, or larger than normal. ?Eat foods that have a lot of fiber, such as beans, whole grains, and fresh fruits and vegetables. ?Drink enough fluid to keep your urine pale yellow. ?Take over-the-counter and prescription medicines only as told by your health care provider. This includes any fiber supplements. ?This information is not intended to replace advice given to you by your health care provider. Make sure you discuss any questions you have with your health care provider. ?Document Revised: 04/19/2019 Document Reviewed: 04/19/2019 ?Elsevier Patient Education ? 2022 [employer] ? ?

## 2021-09-22 NOTE — Progress Notes (Signed)
? ?Provider: Richarda Blade FNP-C  ? ?Shauniece Kwan, Donalee Citrin, NP ? ?Patient Care Team: ?Bretton Tandy, Donalee Citrin, NP as PCP - General (Family Medicine) ? ?Extended Emergency Contact Information ?Primary Emergency Contact: warren,veronique ?Mobile Phone: [phone] ?Relation: Mother ? ?Code Status:  Full Code  ?Goals of care: Advanced Directive information ? ?  09/22/2021  ? 10:00 AM  ?Advanced Directives  ?Does Patient Have a Medical Advance Directive? No  ?Would patient like information on creating a medical advance directive? No - Patient declined  ? ? ? ?Chief Complaint  ?Patient presents with  ? Establish Care  ?  New Patient.   ? ? ?HPI:  ?Pt is a 31 y.o. female seen today establish care here at [facility] for medical management of chronic diseases.  Has a medical history of chronic low back pain, chronic constipation, obesity s/p gastrectomy among other conditions. 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  Has had worsening low back pain.  Stated previous imaging done by previous provider but was unable to obtain imaging.  She has signed a release of information and will be calling to obtain imaging results 2 to 3 years ago.She denies any weakness numbness or tingling on the lower extremities. ? L MP 08/29/2021 ?Health maintenance: ?She is due for Pap smear but states follows up with the gynecologist she will schedule an appointment. ?Also due for tetanus vaccine but states had vaccine about 2 to 3 years ago. ?Due for screening for hep C.  No high risk behaviors reported. ?She declines any form COVID vaccines. ? ?No past medical history on file. ?Past Surgical History:  ?Procedure Laterality Date  ? GASTRECTOMY  2019  ? Dr. Darcella Gasman.  ? ? ?Allergies  ?Allergen Reactions  ? Iodinated Contrast Media   ? ? ?Allergies as of 09/22/2021   ? ?   Reactions  ? Iodinated Contrast Media   ? ?  ? ?  ?Medication List  ?  ? ?  ? Accurate as of September 22, 2021 10:27 AM. If you have any  questions, ask your nurse or doctor.  ?  ?  ? ?  ? ?norgestimate-ethinyl estradiol 0.25-35 MG-MCG tablet ?Commonly known as: ORTHO-CYCLEN ?Take 1 tablet by mouth daily. ?  ? ?  ? ? ?Review of Systems  ?Constitutional:  Positive for fatigue and unexpected weight change. Negative for appetite change, chills and fever.  ?HENT:  Negative for congestion, dental problem, ear discharge, ear pain, facial swelling, hearing loss, nosebleeds, postnasal drip, rhinorrhea, sinus pressure, sinus pain, sneezing, sore throat, tinnitus and trouble swallowing.   ?Eyes:  Positive for visual disturbance. Negative for pain, discharge, redness and itching.  ?     Wears contact lens   ?Respiratory:  Negative for cough, chest tightness, shortness of breath and wheezing.   ?Cardiovascular:  Negative for chest pain, palpitations and leg swelling.  ?Gastrointestinal:  Negative for abdominal distention, abdominal pain, blood in stool, constipation, diarrhea, nausea and vomiting.  ?Endocrine: Negative for cold intolerance, heat intolerance, polydipsia, polyphagia and polyuria.  ?Genitourinary:  Negative for difficulty urinating, dysuria, flank pain, frequency and urgency.  ?Musculoskeletal:  Positive for arthralgias and back pain. Negative for gait problem, joint swelling, myalgias, neck pain and neck stiffness.  ?Skin:  Negative for color change, pallor, rash and wound.  ?Neurological:  Negative for dizziness, syncope, speech difficulty, weakness, light-headedness and numbness.  ?     Chronic headaches intermittent.benign vertigo.  ?Hematological:  Does  not bruise/bleed easily.  ?Psychiatric/Behavioral:  Negative for agitation, behavioral problems, confusion, hallucinations, self-injury, sleep disturbance and suicidal ideas. The patient is not nervous/anxious.   ? ?Immunization History  ?Administered Date(s) Administered  ? Hepatitis B 03/06/2002, 05/01/2002, 09/04/2002  ? ?Pertinent  Health Maintenance Due  ?Topic Date Due  ? PAP  SMEAR-Modifier  Never done  ? INFLUENZA VACCINE  01/13/2022  ? ? ?  09/22/2021  ?  9:59 AM  ?Fall Risk  ?Falls in the past year? 0  ?Was there an injury with Fall? 0  ?Fall Risk Category Calculator 0  ?Fall Risk Category Low  ?Patient Fall Risk Level Low fall risk  ?Patient at Risk for Falls Due to No Fall Risks  ?Fall risk Follow up Falls evaluation completed  ? ?Functional Status Survey: ?  ? ?Vitals:  ? 09/22/21 0950  ?BP: 120/88  ?Pulse: 89  ?Resp: 16  ?Temp: 97.9 ?F (36.6 ?C)  ?SpO2: 99%  ?Weight: 222 lb (100.7 kg)  ?Height: 5\' 6"  (1.676 m)  ? ?Body mass index is 35.83 kg/m? ?Physical Exam ?Vitals reviewed.  ?Constitutional:   ?   General: She is not in acute distress. ?   Appearance: Normal appearance. She is obese. She is not ill-appearing or diaphoretic.  ?HENT:  ?   Head: Normocephalic.  ?   Right Ear: Tympanic membrane, ear canal and external ear normal. There is no impacted cerumen.  ?   Left Ear: Tympanic membrane, ear canal and external ear normal. There is no impacted cerumen.  ?   Nose: Nose normal. No congestion or rhinorrhea.  ?   Mouth/Throat:  ?   Mouth: Mucous membranes are moist.  ?   Pharynx: Oropharynx is clear. No oropharyngeal exudate or posterior oropharyngeal erythema.  ?Eyes:  ?   General: No scleral icterus.    ?   Right eye: No discharge.     ?   Left eye: No discharge.  ?   Extraocular Movements: Extraocular movements intact.  ?   Conjunctiva/sclera: Conjunctivae normal.  ?   Pupils: Pupils are equal, round, and reactive to light.  ?Neck:  ?   Vascular: No carotid bruit.  ?Cardiovascular:  ?   Rate and Rhythm: Normal rate and regular rhythm.  ?   Pulses: Normal pulses.  ?   Heart sounds: Normal heart sounds. No murmur heard. ?  No friction rub. No gallop.  ?Pulmonary:  ?   Effort: Pulmonary effort is normal. No respiratory distress.  ?   Breath sounds: Normal breath sounds. No wheezing, rhonchi or rales.  ?Chest:  ?   Chest wall: No tenderness.  ?Abdominal:  ?   General: Bowel sounds  are normal. There is no distension.  ?   Palpations: Abdomen is soft. There is no mass.  ?   Tenderness: There is no abdominal tenderness. There is no right CVA tenderness, left CVA tenderness, guarding or rebound.  ?Musculoskeletal:     ?   General: No swelling or tenderness. Normal range of motion.  ?   Cervical back: Normal range of motion. No rigidity or tenderness.  ?   Right lower leg: No edema.  ?   Left lower leg: No edema.  ?Lymphadenopathy:  ?   Cervical: No cervical adenopathy.  ?Skin: ?   General: Skin is warm and dry.  ?   Coloration: Skin is not pale.  ?   Findings: No bruising, erythema, lesion or rash.  ?Neurological:  ?   Mental Status: She  is alert and oriented to person, place, and time.  ?   Cranial Nerves: No cranial nerve deficit.  ?   Sensory: No sensory deficit.  ?   Motor: No weakness.  ?   Coordination: Coordination normal.  ?   Gait: Gait normal.  ?Psychiatric:     ?   Mood and Affect: Mood normal.     ?   Speech: Speech normal.     ?   Behavior: Behavior normal.     ?   Thought Content: Thought content normal.     ?   Judgment: Judgment normal.  ? ? ?Labs reviewed: ?No results for input(s): NA, K, CL, CO2, GLUCOSE, BUN, CREATININE, CALCIUM, MG, PHOS in the last 8760 hours. ?No results for input(s): AST, ALT, ALKPHOS, BILITOT, PROT, ALBUMIN in the last 8760 hours. ?No results for input(s): WBC, NEUTROABS, HGB, HCT, MCV, PLT in the last 8760 hours. ?No results found for: TSH ?No results found for: HGBA1C ?No results found for: CHOL, HDL, LDLCALC, LDLDIRECT, TRIG, CHOLHDL ? ?Significant Diagnostic Results in last 30 days:  ?No results found. ? ?Assessment/Plan ?1. Encounter to establish care ?Available records reviewed, due for tetanus vaccine.  Declines any further COVID vaccines.  Recommend fasting blood work today. ? ?2. Chronic bilateral low back pain without sciatica ?Chronic. ?Will need previous records imaging ?Will obtain imaging of the lumbar spine then referred to orthopedic or  spine specialist if needed ?- DG Lumbar Spine Complete; Future ? ?3. Chronic idiopathic constipation ?MiraLAX ineffective ?Linzess has been more helpful ?Continue on Linzess.  Advised to increase fiber in the diet and dri

## 2021-09-23 ENCOUNTER — Other Ambulatory Visit: Payer: Self-pay | Admitting: Family

## 2021-09-23 DIAGNOSIS — M51379 Other intervertebral disc degeneration, lumbosacral region without mention of lumbar back pain or lower extremity pain: Secondary | ICD-10-CM

## 2021-09-23 DIAGNOSIS — G8929 Other chronic pain: Secondary | ICD-10-CM

## 2021-09-23 DIAGNOSIS — M5137 Other intervertebral disc degeneration, lumbosacral region: Secondary | ICD-10-CM

## 2021-09-23 LAB — LIPID PANEL
Cholesterol: 255 mg/dL — ABNORMAL HIGH (ref <200)
HDL: 70 mg/dL (ref >=50)
LDL Cholesterol (Calc): 170 mg/dL (calc) — ABNORMAL HIGH
Non-HDL Cholesterol (Calc): 185 mg/dL (calc) — ABNORMAL HIGH (ref <130)
Total CHOL/HDL Ratio: 3.6 (calc) (ref <5.0)
Triglycerides: 49 mg/dL (ref <150)

## 2021-09-23 LAB — CBC WITH DIFFERENTIAL/PLATELET
Absolute Monocytes: 330 cells/uL (ref 200–950)
Basophils Absolute: 30 cells/uL (ref 0–200)
Basophils Relative: 0.5 %
Eosinophils Absolute: 71 cells/uL (ref 15–500)
Eosinophils Relative: 1.2 %
HCT: 39.3 % (ref 35.0–45.0)
Hemoglobin: 12.6 g/dL (ref 11.7–15.5)
Lymphs Abs: 2602 cells/uL (ref 850–3900)
MCH: 28.4 pg (ref 27.0–33.0)
MCHC: 32.1 g/dL (ref 32.0–36.0)
MCV: 88.5 fL (ref 80.0–100.0)
MPV: 12.8 fL — ABNORMAL HIGH (ref 7.5–12.5)
Monocytes Relative: 5.6 %
Neutro Abs: 2867 cells/uL (ref 1500–7800)
Neutrophils Relative %: 48.6 %
Platelets: 207 10*3/uL (ref 140–400)
RBC: 4.44 10*6/uL (ref 3.80–5.10)
RDW: 12.9 % (ref 11.0–15.0)
Total Lymphocyte: 44.1 %
WBC: 5.9 10*3/uL (ref 3.8–10.8)

## 2021-09-23 LAB — COMPLETE METABOLIC PANEL WITH GFR
AG Ratio: 1.5 (calc) (ref 1.0–2.5)
ALT: 5 U/L — ABNORMAL LOW (ref 6–29)
AST: 8 U/L — ABNORMAL LOW (ref 10–30)
Albumin: 4.3 g/dL (ref 3.6–5.1)
Alkaline phosphatase (APISO): 74 U/L (ref 31–125)
BUN: 14 mg/dL (ref 7–25)
CO2: 28 mmol/L (ref 20–32)
Calcium: 9.7 mg/dL (ref 8.6–10.2)
Chloride: 104 mmol/L (ref 98–110)
Creat: 0.61 mg/dL (ref 0.50–0.97)
Globulin: 2.8 g/dL (calc) (ref 1.9–3.7)
Glucose, Bld: 88 mg/dL (ref 65–99)
Potassium: 4.1 mmol/L (ref 3.5–5.3)
Sodium: 139 mmol/L (ref 135–146)
Total Bilirubin: 0.8 mg/dL (ref 0.2–1.2)
Total Protein: 7.1 g/dL (ref 6.1–8.1)
eGFR: 123 mL/min/{1.73_m2} (ref >=60)

## 2021-09-23 LAB — TSH: TSH: 0.96 mIU/L

## 2021-09-25 ENCOUNTER — Other Ambulatory Visit: Payer: Self-pay

## 2021-09-25 DIAGNOSIS — R5383 Other fatigue: Secondary | ICD-10-CM

## 2021-09-25 DIAGNOSIS — E785 Hyperlipidemia, unspecified: Secondary | ICD-10-CM

## 2021-10-14 ENCOUNTER — Encounter: Payer: Self-pay | Admitting: Orthopaedic Surgery

## 2021-10-14 ENCOUNTER — Ambulatory Visit: Payer: PRIVATE HEALTH INSURANCE | Admitting: Orthopaedic Surgery

## 2021-10-14 VITALS — BP 106/74 | HR 71 | Ht 66.0 in | Wt 220.0 lb

## 2021-10-14 DIAGNOSIS — G8929 Other chronic pain: Secondary | ICD-10-CM

## 2021-10-14 DIAGNOSIS — M47816 Spondylosis without myelopathy or radiculopathy, lumbar region: Secondary | ICD-10-CM | POA: Diagnosis not present

## 2021-10-14 DIAGNOSIS — M545 Low back pain, unspecified: Secondary | ICD-10-CM | POA: Diagnosis not present

## 2021-10-14 NOTE — Progress Notes (Signed)
? ?Office Visit Note ?  ?Patient: Nancy Garrett           ?Date of Birth: Oct 13, 1990           ?MRN: [id number] ?Visit Date: 10/14/2021 ?             ?Requested by: Caesar Bookman, NP ?[address] ?[place],  [place] [address] ?PCP: Ngetich, Donalee Citrin, NP ? ? ?Assessment & Plan: ?Visit Diagnoses:  ?1. Chronic bilateral low back pain, unspecified whether sciatica present   ?2. Facet degeneration of lumbar region   ? ? ?Plan: will proceed with PT for HEP and tx of lumbar L5-S1 degen facets.   Recheck 5 wks.  ? ?Follow-Up Instructions: Return in about 5 weeks (around 11/18/2021).  ? ?Orders:  ?Orders Placed This Encounter  ?Procedures  ? Ambulatory referral to Physical Therapy  ? ?No orders of the defined types were placed in this encounter. ? ? ? ? Procedures: ?No procedures performed ? ? ?Clinical Data: ?No additional findings. ? ? ?Subjective: ?No chief complaint on file. ? ? ?HPI 31 year old female here with ongoing problems with low back pain.  She had problems with obesity previous partial gastrectomy in the past she has used ibuprofen and Tylenol.  She has back pain primarily occasionally radiates into her hips does not radiate down her legs or feet no weakness.  No numbness or tingling in feet.  Pain is worse with standing or turning twisting. ? ?Review of Systems gastrectomy procedure 2019. ? ? ?Objective: ?Vital Signs: BP 106/74   Pulse 71   Ht 5\' 6"  (1.676 m)   Wt 220 lb (99.8 kg)   BMI 35.51 kg/m?  ? ?Physical Exam ?Constitutional:   ?   Appearance: She is well-developed.  ?HENT:  ?   Head: Normocephalic.  ?   Right Ear: External ear normal.  ?   Left Ear: External ear normal. There is no impacted cerumen.  ?Eyes:  ?   Pupils: Pupils are equal, round, and reactive to light.  ?Neck:  ?   Thyroid: No thyromegaly.  ?   Trachea: No tracheal deviation.  ?Cardiovascular:  ?   Rate and Rhythm: Normal rate.  ?Pulmonary:  ?   Effort: Pulmonary effort is normal.  ?Abdominal:  ?   Palpations: Abdomen is soft.   ?Musculoskeletal:  ?   Cervical back: No rigidity.  ?Skin: ?   General: Skin is warm and dry.  ?Neurological:  ?   Mental Status: She is alert and oriented to person, place, and time.  ?Psychiatric:     ?   Behavior: Behavior normal.  ? ?Ortho Exam ?Slow short stride gait.  Knee and ankle jerk are intact negative logroll the hips with mild increased femoral anteversion.  Resisted anterior tib gastrocsoleus function is normal.  She can reach her fingertips to ankle but has back pain with resumption of extension significant increased pain with lumbar extension no pain with forward flexion of lumbar spine. ? ? ?Specialty Comments:  ?No specialty comments available. ? ?Imaging: ?Narrative & Impression  ?CLINICAL DATA:  Midline lower back pain. ?  ?EXAM: ?LUMBAR SPINE - COMPLETE 4+ VIEW ?  ?COMPARISON:  None. ?  ?FINDINGS: ?There is no evidence of lumbar spine fracture. Alignment is normal. ?Very mild intervertebral disc space narrowing is seen at the level ?of L5-S1. ?  ?IMPRESSION: ?Very mild degenerative changes at L5-S1. ?  ?  ?Electronically Signed ?  By: M.D. ?  On: 09/22/2021  19:47 ?   ? ? ? ?PMFS History: ?Patient Active Problem List  ? Diagnosis Date Noted  ? Facet degeneration of lumbar region 10/15/2021  ? Obesity, Class III, BMI 40-49.9 (morbid obesity) (HCC) 07/27/2013  ? ?Past Medical History:  ?Diagnosis Date  ? Chronic abdominal pain   ? Head ache   ?  ?Family History  ?Problem Relation Age of Onset  ? High blood pressure Mother   ? Diabetes Father   ? Diabetes Sister   ? Stroke Sister   ? High blood pressure Sister   ?  ?Past Surgical History:  ?Procedure Laterality Date  ? GASTRECTOMY  2019  ? Dr. Darcella Gasman.  ? LAPAROSCOPIC GASTRIC SLEEVE RESECTION    ? TONSILLECTOMY    ? ?Social History  ? ?Occupational History  ? Not on file  ?Tobacco Use  ? Smoking status: Never  ? Smokeless tobacco: Never  ?Substance and Sexual Activity  ? Alcohol use: Not Currently  ? Drug use: Never  ?  Sexual activity: Not on file  ? ? ? ? ? ? ?

## 2021-10-15 DIAGNOSIS — M47816 Spondylosis without myelopathy or radiculopathy, lumbar region: Secondary | ICD-10-CM | POA: Insufficient documentation

## 2021-10-27 ENCOUNTER — Ambulatory Visit (INDEPENDENT_AMBULATORY_CARE_PROVIDER_SITE_OTHER): Payer: Commercial Managed Care - HMO | Admitting: Rehabilitative and Restorative Service Providers"

## 2021-10-27 ENCOUNTER — Encounter: Payer: Self-pay | Admitting: Rehabilitative and Restorative Service Providers"

## 2021-10-27 DIAGNOSIS — M6281 Muscle weakness (generalized): Secondary | ICD-10-CM

## 2021-10-27 DIAGNOSIS — R293 Abnormal posture: Secondary | ICD-10-CM

## 2021-10-27 DIAGNOSIS — M5459 Other low back pain: Secondary | ICD-10-CM

## 2021-10-27 NOTE — Therapy (Signed)
?OUTPATIENT PHYSICAL THERAPY THORACOLUMBAR EVALUATION ? ? ?Patient Name: Nancy Garrett ?MRN: [id number] ?DOB:30-Sep-1990, 31 y.o., female ?Today's Date: 10/27/2021 ? ? PT End of Session - 10/27/21 1039   ? ? Visit Number 1   ? Number of Visits 12   ? Date for PT Re-Evaluation 12/22/21   ? PT Start Time 0930   ? PT Stop Time 1018   ? PT Time Calculation (min) 48 min   ? Activity Tolerance Patient tolerated treatment well;No increased pain   ? Behavior During Therapy [facility] for tasks assessed/performed   ? ?  ?  ? ?  ? ? ?Past Medical History:  ?Diagnosis Date  ? Chronic abdominal pain   ? Head ache   ? ?Past Surgical History:  ?Procedure Laterality Date  ? GASTRECTOMY  2019  ? Dr. Darcella Gasman.  ? LAPAROSCOPIC GASTRIC SLEEVE RESECTION    ? TONSILLECTOMY    ? ?Patient Active Problem List  ? Diagnosis Date Noted  ? Facet degeneration of lumbar region 10/15/2021  ? Obesity, Class III, BMI 40-49.9 (morbid obesity) (HCC) 07/27/2013  ? ? ?PCP: Caesar Bookman, NP ? ?REFERRING PROVIDER: Eldred Manges, MD ? ?REFERRING DIAG: M54.50,G89.29 (ICD-10-CM) - Chronic bilateral low back pain, unspecified whether sciatica present  ? ?THERAPY DIAG:  ?Abnormal posture ? ?Muscle weakness (generalized) ? ?Other low back pain ? ?ONSET DATE: Off and on again back pain starting 2 years ago.  She is on her feet all day at work and has difficulty with flexion. ? ?SUBJECTIVE:                                                                                                                                                                                          ? ?SUBJECTIVE STATEMENT: ?Off and on back pain over the past 2 years has become constant and severe at times.  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  She would like to improve pain and her ability to function with less back pain. ? ?PERTINENT HISTORY:  ?Obesity ? ?PAIN:  ?Are you having pain? Yes: NPRS scale:  5-9/10 ?Pain location: Lower back ?Pain description: Tight, sore, burning ?Aggravating factors: Flexion, too much time in one position ?Relieving factors: Lie supine and elevate legs ? ? ?PRECAUTIONS: Back ? ?WEIGHT BEARING RESTRICTIONS No ? ?FALLS:  ?Has patient fallen in last 6 months? Yes. Number of falls 4.  Nancy Garrett reports she is just clumsy. ? ?LIVING ENVIRONMENT: ?Lives with: lives with their family ?Lives in: House/apartment ?Stairs: No ?Has following equipment at home: None ? ?OCCUPATION: Hairdresser ? ?PLOF:  Independent ? ?PATIENT GOALS Return to pain-free full function ? ? ?OBJECTIVE:  ? ?DIAGNOSTIC FINDINGS:  ?There is no evidence of lumbar spine fracture. Alignment is normal. ?Very mild intervertebral disc space narrowing is seen at the level ?of L5-S1. ? ?PATIENT SURVEYS:  ?FOTO 39 (Goal 59 in 12 visits) ? ?SCREENING FOR RED FLAGS: ?Bowel or bladder incontinence: No ?Spinal tumors: No ?Cauda equina syndrome: No ?Compression fracture: No ?Abdominal aneurysm: No ? ?COGNITION: ? Overall cognitive status: Within functional limits for tasks assessed   ?  ?SENSATION: ?Not tested.  Nancy Garrett denies peripheral pain or paresthesias. ? ?MUSCLE LENGTH: ?Hamstrings: Right 40 deg; Left 40 deg ? ?POSTURE:  ?Good with some scapular weakness and Nancy Garrett is at the high end of normal for her lumbar lordosis. ? ? ?LUMBAR ROM:  ? ?Active  Nancy/PROM  ?10/27/2021  ?Flexion 120  ?Extension 10  ?Right lateral flexion 25  ?Left lateral flexion 20  ?Right rotation   ?Left rotation   ? (Blank rows = not tested) ? ?LE ROM: ? ?Passive  Right ?10/27/2021 Left ?10/27/2021  ?Hip flexion 90 90  ?Hip extension    ?Hip abduction    ?Hip adduction    ?Hip internal rotation 12 2  ?Hip external rotation 30 36  ?Knee flexion    ?Knee extension    ?Ankle dorsiflexion    ?Ankle plantarflexion    ?Ankle inversion    ?Ankle eversion    ? (Blank rows = not tested) ? ?LE MMT: ? ?MMT ?Deferred Right ?10/27/2021 Left ?10/27/2021  ?Hip flexion    ?Hip  extension    ?Hip abduction    ?Hip adduction    ?Hip internal rotation    ?Hip external rotation    ?Knee flexion    ?Spine extension 8 seconds   ?Ankle dorsiflexion    ?Ankle plantarflexion    ?Ankle inversion    ?Ankle eversion    ? (Blank rows = not tested) ? ? ?TODAY'S TREATMENT  ?Therapeutic Exercises: ?Lumbar extension AROM 10X 3 seconds ?Shoulder blade pinches 10X 5 seconds ?Alternating prone hip extension 2 sets of 10 for 3 seconds ? ?Therapeutic Activities: ?Review imaging and relate this to the spine model, review exam findings and start early HEP, practical bed mobility (log roll and golfers lift), tips for keeping strain off the low back at work (foot on Nancy stool) and home walking prescription. ? ? ?PATIENT EDUCATION:  ?Education details: See above ?Person educated: Patient ?Education method: Explanation, Demonstration, Verbal cues, and Handouts ?Education comprehension: verbalized understanding, returned demonstration, verbal cues required, and needs further education ? ? ?HOME EXERCISE PROGRAM: ?Access Code: [email or web address] ?URL: [email or web address] ?Date: 10/27/2021 ?Prepared by: Pauletta Brownsobert Jhamir Pickup ? ?Exercises ?- Standing Scapular Retraction  - 5 x daily - 7 x weekly - 1 sets - 5 reps - 5 second hold ?- Standing Lumbar Extension at Wall - Forearms  - 5 x daily - 7 x weekly - 1 sets - 5 reps - 3 seconds hold ?- Prone Hip Extension  - 2 x daily - 7 x weekly - 2 sets - 10 reps - 3 seconds hold ? ?ASSESSMENT: ? ?CLINICAL IMPRESSION: ?Patient is Nancy 31 y.o. female who was seen today for physical therapy evaluation and treatment for low back pain.  Limited lower lumbar extension, poor lumbar extensors strength, flexibility impairments and poor postural awareness/body mechanics are affecting function.  Nancy Garrett will benefit from the recommended POC. ? ? ?OBJECTIVE IMPAIRMENTS decreased activity tolerance, decreased endurance, decreased knowledge of condition, difficulty walking,  decreased ROM, decreased  strength, decreased safety awareness, hypomobility, impaired perceived functional ability, increased muscle spasms, impaired flexibility, improper body mechanics, postural dysfunction, and pain.  ? ?ACTIVITY LIMITATIONS community activity, occupation, and school.  ? ?PERSONAL FACTORS Fitness and obesity  are also affecting patient's functional outcome.  ? ? ?REHAB POTENTIAL: Good ? ?CLINICAL DECISION MAKING: Stable/uncomplicated ? ?EVALUATION COMPLEXITY: Low ? ? ?GOALS: ?Goals reviewed with patient? Yes ? ?SHORT TERM GOALS: Target date: 11/24/2021    (Remove Blue Hyperlink) ? ?Ayza will have improved trunk/lumbar extension to at least 20 degrees. ?Baseline: 10 degrees ?Goal status: INITIAL ? ?2.  Nancy Garrett will be able to implement correct posture and body mechanics at work and home, increasing her comfort with work and ADL activities. ?Baseline: Started practical education 10/27/2021 ?Goal status: INITIAL ? ? ? ?LONG TERM GOALS: Target date: 12/22/2021  (Remove Blue Hyperlink) ? ?Improve FOTO to 59. ?Baseline: 39 ?Goal status: INITIAL ? ?2.  Nancy Garrett will report low back pain consistently 0-3/10 on the Numeric Pain Rating Scale. ?Baseline: 5-9/10 ?Goal status: INITIAL ? ?3.  Improve B LE flexibility for hip flexors to 110 degrees; hamstrings to 50 degrees; hip IR to 10 degrees and hip ER to 40 degrees. ?Baseline: See objective ?Goal status: INITIAL ? ?4.  Improve spine strength to 60 seconds on the spine strength test. ?Baseline: 8 seconds 10/27/2021 ?Goal status: INITIAL ? ?5.  Nancy Garrett will be independent with her long-term HEP at DC. ?Baseline: Started 10/27/2021 ?Goal status: INITIAL ? ?PLAN: ?PT FREQUENCY: 1-2x/week ? ?PT DURATION: 8 weeks ? ?PLANNED INTERVENTIONS: Therapeutic exercises, Therapeutic activity, Neuromuscular re-education, Patient/Family education, Joint mobilization, Dry Needling, Electrical stimulation, Cryotherapy, and Manual therapy. ? ?PLAN FOR NEXT SESSION: Review HEP (limited lower lumbar  focused extension AROM, scapular strengthening, low back extensors strengthening), add hip flexors and hamstrings stretching while avoiding flexion, hip hike in doorway, practical posture and body mechanics applicable t

## 2021-11-03 ENCOUNTER — Encounter: Payer: Self-pay | Admitting: Rehabilitative and Restorative Service Providers"

## 2021-11-03 ENCOUNTER — Ambulatory Visit (INDEPENDENT_AMBULATORY_CARE_PROVIDER_SITE_OTHER): Payer: Commercial Managed Care - HMO | Admitting: Rehabilitative and Restorative Service Providers"

## 2021-11-03 DIAGNOSIS — M6281 Muscle weakness (generalized): Secondary | ICD-10-CM | POA: Diagnosis not present

## 2021-11-03 DIAGNOSIS — M5459 Other low back pain: Secondary | ICD-10-CM | POA: Diagnosis not present

## 2021-11-03 DIAGNOSIS — R293 Abnormal posture: Secondary | ICD-10-CM | POA: Diagnosis not present

## 2021-11-03 NOTE — Therapy (Signed)
OUTPATIENT PHYSICAL THERAPY TREATMENT NOTE   Patient Name: Nancy Garrett MRN: [id number] DOB:[birth year], 31 y.o., female Today's Date: 11/03/2021   END OF SESSION:   PT End of Session - 11/03/21 1600     Visit Number 2    Number of Visits 12    Date for PT Re-Evaluation 12/22/21    PT Start Time 1600    PT Stop Time 1639    PT Time Calculation (min) 39 min    Activity Tolerance Patient tolerated treatment well;No increased pain    Behavior During Therapy [facility] for tasks assessed/performed             Past Medical History:  Diagnosis Date   Chronic abdominal pain    Head ache    Past Surgical History:  Procedure Laterality Date   GASTRECTOMY  2019   Dr. Darcella Gasman.   LAPAROSCOPIC GASTRIC SLEEVE RESECTION     TONSILLECTOMY     Patient Active Problem List   Diagnosis Date Noted   Facet degeneration of lumbar region 10/15/2021   Obesity, Class III, BMI 40-49.9 (morbid obesity) (HCC) 07/27/2013    PCP: Caesar Bookman, NP   REFERRING PROVIDER: Eldred Manges, MD   REFERRING DIAG: M54.50,G89.29 (ICD-10-CM) - Chronic bilateral low back pain, unspecified whether sciatica present   ONSET DATE: Off and on again back pain starting 2 years ago.  THERAPY DIAG:  Abnormal posture  Muscle weakness (generalized)  Other low back pain  Rationale for Evaluation and Treatment Rehabilitation  PERTINENT HISTORY: Obesity  PRECAUTIONS: No surgery based precautions noted  SUBJECTIVE: Pt indicated similar overall complaints.  Reported having some pain c extension based movement in HEP but normal day is pain c bending forward.   PAIN:  NPRS scale: 7/10 Pain location: Lower back Pain description: Tight, sore, burning Aggravating factors: Flexion, too much time in one position Relieving factors: Lie supine and elevate legs   OBJECTIVE:    DIAGNOSTIC FINDINGS:  10/27/2021 :  imaging review:  There is no evidence of lumbar spine fracture. Alignment is normal. Very  mild intervertebral disc space narrowing is seen at the level of L5-S1.   PATIENT SURVEYS:  10/27/2021 FOTO 39 (Goal 59 in 12 visits)   SCREENING FOR RED FLAGS: 10/27/2021 Bowel or bladder incontinence: No Spinal tumors: No Cauda equina syndrome: No Compression fracture: No Abdominal aneurysm: No   COGNITION: 10/27/2021 Overall cognitive status: Within functional limits for tasks assessed                          SENSATION: 10/27/2021 Not tested.  Melainie denies peripheral pain or paresthesias.   MUSCLE LENGTH: 10/27/2021 Hamstrings: Right 40 deg; Left 40 deg   POSTURE:  10/27/2021 Good with some scapular weakness and Latoi is at the high end of normal for her lumbar lordosis.     LUMBAR ROM:    Active  A/PROM  10/27/2021  Flexion 120  Extension 10  Right lateral flexion 25  Left lateral flexion 20  Right rotation    Left rotation     (Blank rows = not tested)   LE ROM:   Passive  Right 10/27/2021 Left 10/27/2021  Hip flexion 90 90  Hip extension      Hip abduction      Hip adduction      Hip internal rotation 12 2  Hip external rotation 30 36  Knee flexion      Knee extension  Ankle dorsiflexion      Ankle plantarflexion      Ankle inversion      Ankle eversion       (Blank rows = not tested)   LE MMT:   MMT  Right 10/27/2021 Left 10/27/2021  Hip flexion      Hip extension      Hip abduction      Hip adduction      Hip internal rotation      Hip external rotation      Knee flexion      Spine extension 8 seconds    Ankle dorsiflexion      Ankle plantarflexion      Ankle inversion      Ankle eversion       (Blank rows = not tested)     TODAY'S TREATMENT  11/03/2021:  Therex:   Nustep lvl 5 8 mins UE/LE   Standing lumbar extension x 5 AROM c cues for home adjustments(avoid pain, limit LE movement)   Supine bridge 5 sec hold x 10    Supine isometric hip flexion hold against ipsilateral UE 5 sec x 10   Supine SKC 15 sec x 3  bilateral   Prone over 2 pillows opposite arm/leg lift 2-3 sec hold x 5 each       10/27/2021 Therapeutic Exercises: Lumbar extension AROM 10X 3 seconds Shoulder blade pinches 10X 5 seconds Alternating prone hip extension 2 sets of 10 for 3 seconds   Therapeutic Activities: Review imaging and relate this to the spine model, review exam findings and start early HEP, practical bed mobility (log roll and golfers lift), tips for keeping strain off the low back at work (foot on a stool) and home walking prescription.     PATIENT EDUCATION:  11/03/2021 Education details: HEP progression  Person educated: Patient Education method: Programmer, multimedia, Demonstration, Verbal cues, and Handouts Education comprehension: verbalized understanding, returned demonstration, verbal cues required, and needs further education     HOME EXERCISE PROGRAM: Access Code: [email or web address] URL: [email or web address] Date: 11/03/2021 Prepared by: Chyrel Masson  Exercises - Standing Scapular Retraction  - 5 x daily - 7 x weekly - 1 sets - 5 reps - 5 second hold - Standing Lumbar Extension at Wall - Forearms  - 5 x daily - 7 x weekly - 1 sets - 5 reps - 3 seconds hold - Supine Bridge  - 1-2 x daily - 7 x weekly - 1-2 sets - 10 reps - 3-5 hold - Hooklying Isometric Hip Flexion  - 1-2 x daily - 7 x weekly - 1 sets - 10 reps - 5 hold - Supine Single Knee to Chest Stretch  - 1-2 x daily - 7 x weekly - 1 sets - 5 reps - 15 hold - Prone Alternating Arm and Leg Lifts  - 1-2 x daily - 7 x weekly - 1-2 sets - 10 reps - 3 hold - Prone Alternating Arm and Leg Lifts  - 1-2 x daily - 7 x weekly - 1-2 sets - 10 reps - 3 hold   ASSESSMENT:   CLINICAL IMPRESSION: Reviewed HEP to ensure movement to tolerance and avoid exacerbation of symptoms.  Progressed to include core and hip strength activation intervention to improve functional movement and support throughout day. Continued skilled PT services indicated.       OBJECTIVE IMPAIRMENTS decreased activity tolerance, decreased endurance, decreased knowledge of condition, difficulty walking, decreased ROM, decreased strength, decreased safety awareness, hypomobility, impaired perceived functional ability,  increased muscle spasms, impaired flexibility, improper body mechanics, postural dysfunction, and pain.    ACTIVITY LIMITATIONS community activity, occupation, and school.    PERSONAL FACTORS Fitness and obesity  are also affecting patient's functional outcome.      REHAB POTENTIAL: Good   CLINICAL DECISION MAKING: Stable/uncomplicated   EVALUATION COMPLEXITY: Low     GOALS: Goals reviewed with patient? Yes   SHORT TERM GOALS: Target date: 11/24/2021    Dayton ScrapeJaleesa will have improved trunk/lumbar extension to at least 20 degrees. Baseline: 10 degrees Goal status: INITIAL   2.  Dayton ScrapeJaleesa will be able to implement correct posture and body mechanics at work and home, increasing her comfort with work and ADL activities. Baseline: Started practical education 10/27/2021 Goal status: INITIAL       LONG TERM GOALS: Target date: 12/22/2021    Improve FOTO to 59. Baseline: 39 Goal status: INITIAL   2.  Dayton ScrapeJaleesa will report low back pain consistently 0-3/10 on the Numeric Pain Rating Scale. Baseline: 5-9/10 Goal status: INITIAL   3.  Improve B LE flexibility for hip flexors to 110 degrees; hamstrings to 50 degrees; hip IR to 10 degrees and hip ER to 40 degrees. Baseline: See objective Goal status: INITIAL   4.  Improve spine strength to 60 seconds on the spine strength test. Baseline: 8 seconds 10/27/2021 Goal status: INITIAL   5.  Dayton ScrapeJaleesa will be independent with her long-term HEP at [place]. Baseline: Started 10/27/2021 Goal status: INITIAL   PLAN: PT FREQUENCY: 1-2x/week   PT DURATION: 8 weeks   PLANNED INTERVENTIONS: Therapeutic exercises, Therapeutic activity, Neuromuscular re-education, Patient/Family education, Joint mobilization, Dry  Needling, Electrical stimulation, Cryotherapy, and Manual therapy.   PLAN FOR NEXT SESSION:  Progress core and hip strength to tolerance to improve functional capacity.    Chyrel MassonMichael Jese Comella, PT, DPT, OCS, ATC 11/03/21  4:38 PM

## 2021-11-24 ENCOUNTER — Ambulatory Visit (INDEPENDENT_AMBULATORY_CARE_PROVIDER_SITE_OTHER): Payer: Commercial Managed Care - HMO | Admitting: Physical Therapy

## 2021-11-24 ENCOUNTER — Encounter: Payer: Self-pay | Admitting: Physical Therapy

## 2021-11-24 DIAGNOSIS — M5459 Other low back pain: Secondary | ICD-10-CM | POA: Diagnosis not present

## 2021-11-24 DIAGNOSIS — M6281 Muscle weakness (generalized): Secondary | ICD-10-CM | POA: Diagnosis not present

## 2021-11-24 DIAGNOSIS — R293 Abnormal posture: Secondary | ICD-10-CM

## 2021-11-24 NOTE — Therapy (Signed)
OUTPATIENT PHYSICAL THERAPY TREATMENT NOTE   Patient Name: Clyta Antonich MRN: [id number] DOB:[birth year], 31 y.o., female Today's Date: 11/24/2021   END OF SESSION:   PT End of Session - 11/24/21 0936     Visit Number 3    Number of Visits 12    Date for PT Re-Evaluation 12/22/21    Authorization Type [employer]    Authorization - Number of Visits 40    PT Start Time 0931    PT Stop Time 1012    PT Time Calculation (min) 41 min    Activity Tolerance Patient tolerated treatment well;No increased pain    Behavior During Therapy [facility] for tasks assessed/performed              Past Medical History:  Diagnosis Date   Chronic abdominal pain    Head ache    Past Surgical History:  Procedure Laterality Date   GASTRECTOMY  2019   Dr. Jeb Levering.   LAPAROSCOPIC GASTRIC SLEEVE RESECTION     TONSILLECTOMY     Patient Active Problem List   Diagnosis Date Noted   Facet degeneration of lumbar region 10/15/2021   Obesity, Class III, BMI 40-49.9 (morbid obesity) ([place]) 07/27/2013    PCP: Sandrea Hughs, NP   REFERRING PROVIDER: Marybelle Killings, MD   REFERRING DIAG: M54.50,G89.29 (ICD-10-CM) - Chronic bilateral low back pain, unspecified whether sciatica present   ONSET DATE: Off and on again back pain starting 2 years ago.  THERAPY DIAG:  Abnormal posture  Muscle weakness (generalized)  Other low back pain  Rationale for Evaluation and Treatment Rehabilitation  PERTINENT HISTORY: Obesity  PRECAUTIONS: No surgery based precautions noted  SUBJECTIVE: Reurns to PT after several weeks, reports she's been in [place].  Back is "constantly sore."   PAIN:  NPRS scale: 7/10 Pain location: Lower back Pain description: Tight, sore Aggravating factors: Flexion, too much time in one position Relieving factors: Lie supine and elevate legs   OBJECTIVE:    PATIENT SURVEYS:  10/27/2021 FOTO 39 (Goal 59 in 12 visits) 11/24/21 FOTO 49            SENSATION: 10/27/2021 Not  tested.  Rucha denies peripheral pain or paresthesias.   MUSCLE LENGTH: 10/27/2021 Hamstrings: Right 40 deg; Left 40 deg   POSTURE:  10/27/2021 Good with some scapular weakness and Thresia is at the high end of normal for her lumbar lordosis.     LUMBAR ROM:    Active  A/PROM  10/27/2021 AROM  Flexion 120 95  Extension 10 10  Right lateral flexion 25 20  Left lateral flexion 20 20   (Blank rows = not tested)   LE ROM:   Passive  Right 10/27/2021 Left 10/27/2021  Hip flexion 90 90  Hip extension      Hip abduction      Hip adduction      Hip internal rotation 12 2  Hip external rotation 30 36  Knee flexion      Knee extension      Ankle dorsiflexion      Ankle plantarflexion      Ankle inversion      Ankle eversion       (Blank rows = not tested)   LE MMT:   MMT  Right 10/27/2021 Left 10/27/2021  Hip flexion      Hip extension      Hip abduction      Hip adduction      Hip internal rotation  Hip external rotation      Knee flexion      Spine extension 8 seconds    Ankle dorsiflexion      Ankle plantarflexion      Ankle inversion      Ankle eversion       (Blank rows = not tested)     TODAY'S TREATMENT  11/24/21 Therex:      Aerobic: NuStep L5 x 8 min, UE/LEs     Supine: SKTC 5x15 sec bil LTR 5 x 10 sec hold bil     Other: ROM testing - see above  Self-Care: Reviewed FOTO score with pt   11/03/2021:  Therex:   Nustep lvl 5 8 mins UE/LE   Standing lumbar extension x 5 AROM c cues for home adjustments(avoid pain, limit LE movement)   Supine bridge 5 sec hold x 10    Supine isometric hip flexion hold against ipsilateral UE 5 sec x 10   Supine SKC 15 sec x 3 bilateral   Prone over 2 pillows opposite arm/leg lift 2-3 sec hold x 5 each       10/27/2021 Therapeutic Exercises: Lumbar extension AROM 10X 3 seconds Shoulder blade pinches 10X 5 seconds Alternating prone hip extension 2 sets of 10 for 3 seconds   Therapeutic  Activities: Review imaging and relate this to the spine model, review exam findings and start early HEP, practical bed mobility (log roll and golfers lift), tips for keeping strain off the low back at work (foot on a stool) and home walking prescription.     PATIENT EDUCATION:  11/03/2021 Education details: HEP progression  Person educated: Patient Education method: Consulting civil engineer, Demonstration, Verbal cues, and Handouts Education comprehension: verbalized understanding, returned demonstration, verbal cues required, and needs further education     HOME EXERCISE PROGRAM: Access Code: [email or web address] URL: [email or web address] Date: 11/03/2021 Prepared by: Scot Jun  Exercises - Standing Scapular Retraction  - 5 x daily - 7 x weekly - 1 sets - 5 reps - 5 second hold - Standing Lumbar Extension at Wall - Forearms  - 5 x daily - 7 x weekly - 1 sets - 5 reps - 3 seconds hold - Supine Bridge  - 1-2 x daily - 7 x weekly - 1-2 sets - 10 reps - 3-5 hold - Hooklying Isometric Hip Flexion  - 1-2 x daily - 7 x weekly - 1 sets - 10 reps - 5 hold - Supine Single Knee to Chest Stretch  - 1-2 x daily - 7 x weekly - 1 sets - 5 reps - 15 hold - Prone Alternating Arm and Leg Lifts  - 1-2 x daily - 7 x weekly - 1-2 sets - 10 reps - 3 hold - Prone Alternating Arm and Leg Lifts  - 1-2 x daily - 7 x weekly - 1-2 sets - 10 reps - 3 hold   ASSESSMENT:   CLINICAL IMPRESSION: Pt still continues to c/o constant soreness in low back, but does demonstrate 10% improvement in FOTO score today.  Lumbar ROM without significant changes at this time.     OBJECTIVE IMPAIRMENTS decreased activity tolerance, decreased endurance, decreased knowledge of condition, difficulty walking, decreased ROM, decreased strength, decreased safety awareness, hypomobility, impaired perceived functional ability, increased muscle spasms, impaired flexibility, improper body mechanics, postural dysfunction, and pain.    ACTIVITY  LIMITATIONS community activity, occupation, and school.    PERSONAL FACTORS Fitness and obesity  are also affecting patient's functional outcome.  REHAB POTENTIAL: Good   CLINICAL DECISION MAKING: Stable/uncomplicated   EVALUATION COMPLEXITY: Low     GOALS: Goals reviewed with patient? Yes   SHORT TERM GOALS: Target date: 11/24/2021    Avinell will have improved trunk/lumbar extension to at least 20 degrees. Baseline: 10 degrees Goal status: INITIAL   2.  Dondria will be able to implement correct posture and body mechanics at work and home, increasing her comfort with work and ADL activities. Baseline: Started practical education 10/27/2021 Goal status: INITIAL       LONG TERM GOALS: Target date: 12/22/2021    Improve FOTO to 59. Baseline: 39 Goal status: INITIAL   2.  Cayci will report low back pain consistently 0-3/10 on the Numeric Pain Rating Scale. Baseline: 5-9/10 Goal status: INITIAL   3.  Improve B LE flexibility for hip flexors to 110 degrees; hamstrings to 50 degrees; hip IR to 10 degrees and hip ER to 40 degrees. Baseline: See objective Goal status: INITIAL   4.  Improve spine strength to 60 seconds on the spine strength test. Baseline: 8 seconds 10/27/2021 Goal status: INITIAL   5.  Tishana will be independent with her long-term HEP at [place]. Baseline: Started 10/27/2021 Goal status: INITIAL   PLAN: PT FREQUENCY: 1-2x/week   PT DURATION: 8 weeks   PLANNED INTERVENTIONS: Therapeutic exercises, Therapeutic activity, Neuromuscular re-education, Patient/Family education, Joint mobilization, Dry Needling, Electrical stimulation, Cryotherapy, and Manual therapy.   PLAN FOR NEXT SESSION:  check goals, may want to hold PT      Laureen Abrahams, PT, DPT 11/24/21 10:13 AM

## 2021-11-25 ENCOUNTER — Ambulatory Visit (INDEPENDENT_AMBULATORY_CARE_PROVIDER_SITE_OTHER): Payer: Commercial Managed Care - HMO | Admitting: Physical Therapy

## 2021-11-25 ENCOUNTER — Encounter: Payer: Self-pay | Admitting: Physical Therapy

## 2021-11-25 DIAGNOSIS — M5459 Other low back pain: Secondary | ICD-10-CM

## 2021-11-25 DIAGNOSIS — R293 Abnormal posture: Secondary | ICD-10-CM

## 2021-11-25 DIAGNOSIS — M6281 Muscle weakness (generalized): Secondary | ICD-10-CM | POA: Diagnosis not present

## 2021-11-25 NOTE — Therapy (Addendum)
OUTPATIENT PHYSICAL THERAPY TREATMENT NOTE /DISCHARGE   Patient Name: Nancy Garrett MRN: [id number] DOB:07-10-1990, 31 y.o., female Today's Date: 11/25/2021   END OF SESSION:   PT End of Session - 11/25/21 0927     Visit Number 4    Number of Visits 12    Date for PT Re-Evaluation 12/22/21    Authorization Type [employer]    Authorization - Number of Visits 40    PT Start Time 0845    PT Stop Time 0924    PT Time Calculation (min) 39 min    Activity Tolerance Patient tolerated treatment well;No increased pain    Behavior During Therapy [facility] for tasks assessed/performed               Past Medical History:  Diagnosis Date   Chronic abdominal pain    Head ache    Past Surgical History:  Procedure Laterality Date   GASTRECTOMY  2019   Dr. Jeb Levering.   LAPAROSCOPIC GASTRIC SLEEVE RESECTION     TONSILLECTOMY     Patient Active Problem List   Diagnosis Date Noted   Facet degeneration of lumbar region 10/15/2021   Obesity, Class III, BMI 40-49.9 (morbid obesity) ([place]) 07/27/2013    PCP: Sandrea Hughs, NP   REFERRING PROVIDER: Marybelle Killings, MD   REFERRING DIAG: M54.50,G89.29 (ICD-10-CM) - Chronic bilateral low back pain, unspecified whether sciatica present   ONSET DATE: Off and on again back pain starting 2 years ago.  THERAPY DIAG:  Abnormal posture  Muscle weakness (generalized)  Other low back pain  Rationale for Evaluation and Treatment Rehabilitation  PERTINENT HISTORY: Obesity  PRECAUTIONS: No surgery based precautions noted  SUBJECTIVE: back is "the same"   PAIN:  NPRS scale: 8/10 Pain location: Lower back Pain description: Tight, sore Aggravating factors: Flexion, too much time in one position Relieving factors: Lie supine and elevate legs   OBJECTIVE:    PATIENT SURVEYS:  10/27/2021 FOTO 39 (Goal 59 in 12 visits) 11/24/21 FOTO 49            SENSATION: 10/27/2021 Not tested.  Mckinley denies peripheral pain or paresthesias.    MUSCLE LENGTH: 10/27/2021 Hamstrings: Right 40 deg; Left 40 deg 11/25/21: Hamstrings (90/90) Rt: -8 deg; Lt -18 deg   POSTURE:  10/27/2021 Good with some scapular weakness and Mckensey is at the high end of normal for her lumbar lordosis.     LUMBAR ROM:    Active  A/PROM  10/27/2021 AROM  Flexion 120 95  Extension 10 10  Right lateral flexion 25 20  Left lateral flexion 20 20   (Blank rows = not tested)   LE ROM:   Passive  Right 10/27/2021 Left 10/27/2021 Right 11/25/21 Left 11/25/21  Hip flexion 90 90 112 116  Hip extension        Hip abduction        Hip adduction        Hip internal rotation 12 2 40 35  Hip external rotation 30 36 39 42  Knee flexion        Knee extension        Ankle dorsiflexion        Ankle plantarflexion        Ankle inversion        Ankle eversion         (Blank rows = not tested)   LE MMT:   MMT  Right 10/27/2021 Left 10/27/2021  Hip flexion  Hip extension      Hip abduction      Hip adduction      Hip internal rotation      Hip external rotation      Knee flexion      Spine extension 8 seconds  60 sec  Ankle dorsiflexion      Ankle plantarflexion      Ankle inversion      Ankle eversion       (Blank rows = not tested)     TODAY'S TREATMENT  11/25/21 Therex: NuStep L5 x 8 min, UE/LEs  DKTC 5x15 sec bil LTR 5 x 10 sec hold bil Piriformis stretch 5x20 sec Bridges x 10 reps; 5 sec hold Scapular Retraction x10 reps; 5 sec hold  11/24/21 Therex:      Aerobic: NuStep L5 x 8 min, UE/LEs     Supine: SKTC 5x15 sec bil LTR 5 x 10 sec hold bil     Other: ROM testing - see above  Self-Care: Reviewed FOTO score with pt   11/03/2021:  Therex:   Nustep lvl 5 8 mins UE/LE   Standing lumbar extension x 5 AROM c cues for home adjustments(avoid pain, limit LE movement)   Supine bridge 5 sec hold x 10    Supine isometric hip flexion hold against ipsilateral UE 5 sec x 10   Supine SKC 15 sec x 3 bilateral   Prone over 2  pillows opposite arm/leg lift 2-3 sec hold x 5 each       10/27/2021 Therapeutic Exercises: Lumbar extension AROM 10X 3 seconds Shoulder blade pinches 10X 5 seconds Alternating prone hip extension 2 sets of 10 for 3 seconds   Therapeutic Activities: Review imaging and relate this to the spine model, review exam findings and start early HEP, practical bed mobility (log roll and golfers lift), tips for keeping strain off the low back at work (foot on a stool) and home walking prescription.     PATIENT EDUCATION:  11/03/2021 Education details: HEP progression  Person educated: Patient Education method: Consulting civil engineer, Demonstration, Verbal cues, and Handouts Education comprehension: verbalized understanding, returned demonstration, verbal cues required, and needs further education     HOME EXERCISE PROGRAM: Access Code: [email or web address] URL: [email or web address] Date: 11/03/2021 Prepared by: Scot Jun  Exercises - Standing Scapular Retraction  - 5 x daily - 7 x weekly - 1 sets - 5 reps - 5 second hold - Standing Lumbar Extension at [place]  - 5 x daily - 7 x weekly - 1 sets - 5 reps - 3 seconds hold - Supine Bridge  - 1-2 x daily - 7 x weekly - 1-2 sets - 10 reps - 3-5 hold - Hooklying Isometric Hip Flexion  - 1-2 x daily - 7 x weekly - 1 sets - 10 reps - 5 hold - Supine Single Knee to Chest Stretch  - 1-2 x daily - 7 x weekly - 1 sets - 5 reps - 15 hold - Prone Alternating Arm and Leg Lifts  - 1-2 x daily - 7 x weekly - 1-2 sets - 10 reps - 3 hold - Prone Alternating Arm and Leg Lifts  - 1-2 x daily - 7 x weekly - 1-2 sets - 10 reps - 3 hold   ASSESSMENT:   CLINICAL IMPRESSION: Pt has met 3/5 LTGs at this time, demonstrating improvement in flexibility and strength.  Pain however continues to be elevated and unchanged.  At this point recommend holding PT and  return to MD to discuss next steps.  Will hold PT today.   OBJECTIVE IMPAIRMENTS decreased activity  tolerance, decreased endurance, decreased knowledge of condition, difficulty walking, decreased ROM, decreased strength, decreased safety awareness, hypomobility, impaired perceived functional ability, increased muscle spasms, impaired flexibility, improper body mechanics, postural dysfunction, and pain.    ACTIVITY LIMITATIONS community activity, occupation, and school.    PERSONAL FACTORS Fitness and obesity  are also affecting patient's functional outcome.      REHAB POTENTIAL: Good   CLINICAL DECISION MAKING: Stable/uncomplicated   EVALUATION COMPLEXITY: Low     GOALS: Goals reviewed with patient? Yes   SHORT TERM GOALS: Target date: 11/24/2021    Korinne will have improved trunk/lumbar extension to at least 20 degrees. Baseline: 10 degrees Goal status: NOT MET 11/25/21   2.  Jesslyn will be able to implement correct posture and body mechanics at work and home, increasing her comfort with work and ADL activities. Baseline: Started practical education 10/27/2021 Goal status: met 11/25/21       LONG TERM GOALS: Target date: 12/22/2021    Improve FOTO to 59. Baseline: 39 Goal status: NOT MET 11/24/21   2.  Sarahi will report low back pain consistently 0-3/10 on the Numeric Pain Rating Scale. Baseline: 5-9/10 Goal status: not met 11/25/21   3.  Improve B LE flexibility for hip flexors to 110 degrees; hamstrings to 50 degrees; hip IR to 10 degrees and hip ER to 40 degrees. Baseline: See objective Goal status: met 11/25/21   4.  Improve spine strength to 60 seconds on the spine strength test. Baseline: 8 seconds 10/27/2021 Goal status: met 11/25/21   5.  Madalynne will be independent with her long-term HEP at [place]. Baseline: Started 10/27/2021 Goal status: met 11/25/21   PLAN: PT FREQUENCY: 1-2x/week   PT DURATION: 8 weeks   PLANNED INTERVENTIONS: Therapeutic exercises, Therapeutic activity, Neuromuscular re-education, Patient/Family education, Joint mobilization, Dry Needling,  Electrical stimulation, Cryotherapy, and Manual therapy.   PLAN FOR NEXT SESSION:  hold PT    Laureen Abrahams, PT, DPT 11/25/21 9:27 AM  PHYSICAL THERAPY DISCHARGE SUMMARY  Visits from Start of Care: 4  Current functional level related to goals / functional outcomes: See note   Remaining deficits: See note   Education / Equipment: HEP    Patient goals were  mostly met . Patient is being discharged due to not returning since the last visit.  Scot Jun, PT, DPT, OCS, ATC 12/25/21  8:58 AM

## 2021-12-02 ENCOUNTER — Ambulatory Visit (INDEPENDENT_AMBULATORY_CARE_PROVIDER_SITE_OTHER): Payer: PRIVATE HEALTH INSURANCE | Admitting: Orthopaedic Surgery

## 2021-12-02 ENCOUNTER — Other Ambulatory Visit: Payer: Self-pay

## 2021-12-02 ENCOUNTER — Encounter: Payer: Self-pay | Admitting: Orthopaedic Surgery

## 2021-12-02 VITALS — BP 105/72 | HR 60 | Ht 66.0 in | Wt 220.0 lb

## 2021-12-02 DIAGNOSIS — M545 Low back pain, unspecified: Secondary | ICD-10-CM

## 2021-12-02 DIAGNOSIS — M47816 Spondylosis without myelopathy or radiculopathy, lumbar region: Secondary | ICD-10-CM

## 2021-12-17 ENCOUNTER — Other Ambulatory Visit: Payer: PRIVATE HEALTH INSURANCE

## 2022-01-16 ENCOUNTER — Encounter: Payer: Self-pay | Admitting: General Practice

## 2022-01-26 ENCOUNTER — Other Ambulatory Visit: Payer: Medicaid Other

## 2022-02-24 ENCOUNTER — Other Ambulatory Visit (HOSPITAL_COMMUNITY)
Admission: RE | Admit: 2022-02-24 | Discharge: 2022-02-24 | Disposition: A | Discharge status: Home / Self Care | Payer: Commercial Managed Care - HMO | Source: Ambulatory Visit

## 2022-02-24 ENCOUNTER — Encounter: Payer: Self-pay | Admitting: General Practice

## 2022-02-24 ENCOUNTER — Ambulatory Visit (INDEPENDENT_AMBULATORY_CARE_PROVIDER_SITE_OTHER): Payer: Commercial Managed Care - HMO

## 2022-02-24 VITALS — Wt 237.0 lb

## 2022-02-24 DIAGNOSIS — O99211 Obesity complicating pregnancy, first trimester: Secondary | ICD-10-CM

## 2022-02-24 DIAGNOSIS — Z903 Acquired absence of stomach [part of]: Secondary | ICD-10-CM | POA: Diagnosis not present

## 2022-02-24 DIAGNOSIS — O09291 Supervision of pregnancy with other poor reproductive or obstetric history, first trimester: Secondary | ICD-10-CM

## 2022-02-24 DIAGNOSIS — Z3481 Encounter for supervision of other normal pregnancy, first trimester: Secondary | ICD-10-CM

## 2022-02-24 DIAGNOSIS — Z3480 Encounter for supervision of other normal pregnancy, unspecified trimester: Secondary | ICD-10-CM | POA: Insufficient documentation

## 2022-02-24 DIAGNOSIS — Z124 Encounter for screening for malignant neoplasm of cervix: Secondary | ICD-10-CM

## 2022-02-24 DIAGNOSIS — Z8632 Personal history of gestational diabetes: Secondary | ICD-10-CM

## 2022-02-24 DIAGNOSIS — Z1331 Encounter for screening for depression: Secondary | ICD-10-CM

## 2022-02-24 DIAGNOSIS — R519 Headache, unspecified: Secondary | ICD-10-CM

## 2022-02-24 DIAGNOSIS — Z6838 Body mass index (BMI) 38.0-38.9, adult: Secondary | ICD-10-CM

## 2022-02-24 DIAGNOSIS — O99210 Obesity complicating pregnancy, unspecified trimester: Secondary | ICD-10-CM

## 2022-02-24 MED ORDER — METRONIDAZOLE 0.75 % VA GEL: 1.0000 | Freq: Every day | VAGINAL | 0 refills | Status: DC

## 2022-02-24 NOTE — Progress Notes (Signed)
Subjective:   Nancy Garrett is a 31 y.o. G2P1001 at [redacted]w[redacted]d by Definite LMP of December 17, 2021 being seen today for her first obstetrical visit.  Patient states this was a planned pregnancy.  Patient reports she was taking Mirena prior to conception.   Gynecological/Obstetrical History: Patient reports no history of gynecological surgeries.  Patient denies history of abnormal pap smears.   Pregnancy history fully reviewed. Patient does intend to breast feed. Patient obstetrical history is significant for obesity, pre-eclampsia, and GDMA2 in previous pregnancy .   Sexual Activity and Vaginal Concerns: Patient is currently sexually active and no pain or discomfort during intercourse.  She reports vaginal discharge that is concern for BV. She denies bleeding, irritation, or odor. Patient also denies pain or difficulty with urination. Patient denies constipation/diarrhea or nausea/vomiting.   Medical History/ROS: Patient denies medical history significant for cardiovascular, respiratory, gastrointestinal, or hematological disorders. Patient also denies history of MH disorders including anxiety and/or depression.   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  Patient reports headache.  Reports constant HA that she treated with tylenol once and had no relief. She states she has not tried any other options regarding treating the HA. She endorses having same problems with previous pregnancy. 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   She reports history of migraine with light/sound sensitivity. She reports ability to function, but with restriction. She reports taking "some medication" but it "just numbed the front of my head."   Social History: Patient denies history or current usage of tobacco, alcohol, or drugs.  Patient reports husband is FOB, Koleen Nimrod who is involved, supportive, and present.  Patient reports that she  lives with husband and daughter-Royale and endorses safety at home. Patient is not currently employed.  HISTORY: OB History  Gravida Para Term Preterm AB Living  2 1 1  0 0 1  SAB IAB Ectopic Multiple Live Births  0 0 0 0 1    # Outcome Date GA Lbr Len/2nd Weight Sex Delivery Anes PTL Lv  2 Current           1 Term 10/14/15 [redacted]w[redacted]d  7 lb 11 oz (3.487 kg) F Vag-Spont   LIV     Birth Comments: Gest diabetes    Last pap smear was done last and was abnormal - NILM with HPV  Past Medical History:  Diagnosis Date   Chronic abdominal pain    Head ache    Past Surgical History:  Procedure Laterality Date   GASTRECTOMY  2019   Dr. 2020.   LAPAROSCOPIC GASTRIC SLEEVE RESECTION     TONSILLECTOMY     Family History  Problem Relation Age of Onset   High blood pressure Mother    Diabetes Father    Diabetes Sister    Stroke Sister    High blood pressure Sister    Social History   Tobacco Use   Smoking status: Never   Smokeless tobacco: Never  Vaping Use   Vaping Use: Never used  Substance Use Topics   Alcohol use: Yes    Alcohol/week: 2.0 standard drinks of alcohol    Types: 2 Glasses of wine per week   Drug use: Never   Allergies  Allergen Reactions   Iodinated Contrast Media    Ivp Dye [Iodinated Contrast Media] Nausea And Vomiting   Current Outpatient Medications on File Prior to Visit  Medication Sig Dispense Refill  cephALEXin (KEFLEX) 500 MG capsule Take 1 capsule (500 mg total) by mouth 4 (four) times daily. (Patient not taking: Reported on 10/14/2021) 20 capsule 0   linaclotide (LINZESS) 72 MCG capsule Take 72 mcg by mouth daily as needed.     norgestimate-ethinyl estradiol (ORTHO-CYCLEN) 0.25-35 MG-MCG tablet Take 1 tablet by mouth daily.     Prenatal Vit-Fe Fumarate-FA (MULTIVITAMIN-PRENATAL) 27-0.8 MG TABS tablet Take 1 tablet by mouth daily at 12 noon.     No current facility-administered medications on file prior to visit.    Review of  Systems Pertinent items noted in HPI and remainder of comprehensive ROS otherwise negative.  Exam   Vitals:   02/24/22 0909  Weight: 237 lb (107.5 kg)   Fetal Heart Rate (bpm): 173  Physical Exam Constitutional:      Appearance: Normal appearance.  Genitourinary:     Genitourinary Comments: -Normal External Genitalia: Non tender, no apparent discharge at introitus.  -Vaginal Vault: Pink mucosa with good rugae. Small amt frothy whitish gray malodor discharge. -Cervix:Pink, no lesions, cysts, or polyps.  Appears closed. No active bleeding from os- Pap collected   Uterine size difficult to assess d/t body habitus.  No tenderness in adnexa.    HENT:     Head: Normocephalic and atraumatic.  Eyes:     Conjunctiva/sclera: Conjunctivae normal.  Cardiovascular:     Rate and Rhythm: Normal rate.     Heart sounds: Normal heart sounds.  Pulmonary:     Effort: Pulmonary effort is normal. No respiratory distress.  Abdominal:     General: Bowel sounds are normal.     Palpations: Abdomen is soft.  Musculoskeletal:        General: Normal range of motion.     Cervical back: Normal range of motion.  Neurological:     Mental Status: She is alert and oriented to person, place, and time.  Skin:    General: Skin is warm and dry.  Psychiatric:        Mood and Affect: Mood normal.        Behavior: Behavior normal.  Vitals reviewed. Exam conducted with a chaperone present.     Assessment:   30 y.o. year old G2P1001 Patient Active Problem List   Diagnosis Date Noted   Facet degeneration of lumbar region 10/15/2021   Obesity, Class III, BMI 40-49.9 (morbid obesity) ([place]) 07/27/2013     Plan:  1. Encounter for supervision of other normal pregnancy in first trimester -Congratulations given and patient welcomed to practice. -Discussed usage of Babyscripts and virtual visits as additional source of managing and completing PN visits.   *Reviewed prenatal visit schedule and platforms used  for virtual visits.  -Anticipatory guidance for prenatal visits including labs, ultrasounds, and testing; Initial labs drawn. -Genetic Screening discussed, First trimester screen, Quad screen, and NIPS: ordered. -Encouraged to complete and utilize MyChart Registration for her ability to review results, send requests, and have questions addressed.  -Discussed estimated due date of September 22, 2021. -Ultrasound discussed; fetal anatomic survey: ordered. -Encouraged to seek out care at office or emergency room for urgent and/or emergent concerns. -Educated on the nature of [facility] with multiple MDs and other Advanced Practice Providers was explained to patient; also emphasized that residents, students are part of our team. Informed of her right to refuse care as she deems appropriate.  -No questions or concerns.   2. [redacted] weeks gestation of pregnancy -Ultrasound consistent with dates.   3.  H/O gastric sleeve -Monitor for persistent abdominal pain. -Consider alternate methods for GTT. -Order TSH at next visit for baseline  4. Obesity in Pregnancy -BMI today 38 -Reviewed anticipated weight gain. -Referral placed for Cardio-Ob -Start bASA at 13 weeks.  5. History of gestational diabetes -Insulin dependent -HgB A1C collected -Will consult with MD if abnormal considering h/o bariatric surgery.  6. Hx of preeclampsia, prior pregnancy, currently pregnant, first trimester -BL labs collected -Start bASA at 13 weeks.  7. Pap smear for cervical cancer screening -Completed -Instructed to complete mychart registration for access to results.   8. Positive screening for depression on 9-item Patient Health Questionnaire (PHQ-9) -Elevated -Referral for IBH placed.  9. Chronic daily headache -Discussed safety of tylenol for HA management. -Reviewed need for neurology consult considering history.    Problem list reviewed and updated. Routine obstetric  precautions reviewed.  No orders of the defined types were placed in this encounter.   No follow-ups on file.     Cherre Robins, CNM 02/24/2022 9:45 AM

## 2022-02-24 NOTE — Progress Notes (Signed)
Patient has headache condition and states she has had some episodes of overheating/passing out. Armandina Stammer RN    DATING AND VIABILITY SONOGRAM   Pecolia Marando is a 31 y.o. year old G2P1001 with LMP Patient's last menstrual period was 12/17/2021. which would correlate to  [redacted]w[redacted]d weeks gestation.  She has regular menstrual cycles.   She is here today for a confirmatory initial sonogram.    GESTATION: SINGLETON     FETAL ACTIVITY:          Heart rate         173 bpm          The fetus is active.    ADNEXA: The ovaries are normal.   GESTATIONAL AGE AND  BIOMETRICS:  Gestational criteria: Estimated Date of Delivery: 09/23/22 by LMP now at [redacted]w[redacted]d  Previous Scans:0      CROWN RUMP LENGTH           3.00 cm         9-6 weeks                                                                               AVERAGE EGA(BY THIS SCAN):  9-6 weeks  WORKING EDD( LMP ):  09/23/2022     TECHNICIAN COMMENTS: Patient informed that the ultrasound is considered a limited obstetric ultrasound and is not intended to be a complete ultrasound exam.  Patient also informed that the ultrasound is not being completed with the intent of assessing for fetal or placental anomalies or any pelvic abnormalities. Explained that the purpose of today's ultrasound is to assess for fetal heart rate.  Patient acknowledges the purpose of the exam and the limitations of the study.    Armandina Stammer 02/24/2022 9:41 AM

## 2022-02-25 ENCOUNTER — Encounter: Payer: Self-pay | Admitting: General Practice

## 2022-02-25 LAB — HEMOGLOBIN A1C
Est. average glucose Bld gHb Est-mCnc: 114 mg/dL
Hgb A1c MFr Bld: 5.6 % (ref 4.8–5.6)

## 2022-02-25 LAB — CBC/D/PLT+RPR+RH+ABO+RUBIGG...
Antibody Screen: NEGATIVE
Basophils Absolute: 0 10*3/uL (ref 0.0–0.2)
Basos: 0 %
EOS (ABSOLUTE): 0 10*3/uL (ref 0.0–0.4)
Eos: 0 %
HCV Ab: NONREACTIVE
HIV Screen 4th Generation wRfx: NONREACTIVE
Hematocrit: 39.2 % (ref 34.0–46.6)
Hemoglobin: 12.9 g/dL (ref 11.1–15.9)
Hepatitis B Surface Ag: NEGATIVE
Immature Grans (Abs): 0 10*3/uL (ref 0.0–0.1)
Immature Granulocytes: 0 %
Lymphocytes Absolute: 2.5 10*3/uL (ref 0.7–3.1)
Lymphs: 29 %
MCH: 27.9 pg (ref 26.6–33.0)
MCHC: 32.9 g/dL (ref 31.5–35.7)
MCV: 85 fL (ref 79–97)
Monocytes Absolute: 0.4 10*3/uL (ref 0.1–0.9)
Monocytes: 4 %
Neutrophils Absolute: 5.9 10*3/uL (ref 1.4–7.0)
Neutrophils: 67 %
Platelets: 214 10*3/uL (ref 150–450)
RBC: 4.63 x10E6/uL (ref 3.77–5.28)
RDW: 13.3 % (ref 11.7–15.4)
RPR Ser Ql: NONREACTIVE
Rh Factor: POSITIVE
Rubella Antibodies, IGG: 1.07 index (ref >0.99)
WBC: 8.9 10*3/uL (ref 3.4–10.8)

## 2022-02-25 LAB — HCV INTERPRETATION

## 2022-02-25 LAB — PROTEIN / CREATININE RATIO, URINE
Creatinine, Urine: 255.4 mg/dL
Protein, Ur: 20.9 mg/dL
Protein/Creat Ratio: 82 mg/g creat (ref 0–200)

## 2022-02-26 LAB — URINE CULTURE

## 2022-02-28 DIAGNOSIS — O99210 Obesity complicating pregnancy, unspecified trimester: Secondary | ICD-10-CM | POA: Insufficient documentation

## 2022-02-28 DIAGNOSIS — O09299 Supervision of pregnancy with other poor reproductive or obstetric history, unspecified trimester: Secondary | ICD-10-CM | POA: Insufficient documentation

## 2022-02-28 DIAGNOSIS — Z8632 Personal history of gestational diabetes: Secondary | ICD-10-CM | POA: Insufficient documentation

## 2022-02-28 DIAGNOSIS — Z903 Acquired absence of stomach [part of]: Secondary | ICD-10-CM | POA: Insufficient documentation

## 2022-02-28 DIAGNOSIS — Z8759 Personal history of other complications of pregnancy, childbirth and the puerperium: Secondary | ICD-10-CM | POA: Insufficient documentation

## 2022-02-28 DIAGNOSIS — O09291 Supervision of pregnancy with other poor reproductive or obstetric history, first trimester: Secondary | ICD-10-CM | POA: Insufficient documentation

## 2022-03-01 DIAGNOSIS — O0990 Supervision of high risk pregnancy, unspecified, unspecified trimester: Secondary | ICD-10-CM | POA: Insufficient documentation

## 2022-03-01 MED ORDER — ASPIRIN 81 MG PO TBEC: 81.0000 mg | DELAYED_RELEASE_TABLET | Freq: Every day | ORAL | 2 refills | Status: DC

## 2022-03-02 ENCOUNTER — Other Ambulatory Visit: Payer: Self-pay

## 2022-03-02 LAB — CYTOLOGY - PAP
Chlamydia: NEGATIVE
Comment: NEGATIVE
Comment: NEGATIVE
Comment: NORMAL
Diagnosis: UNDETERMINED — AB
High risk HPV: NEGATIVE
Neisseria Gonorrhea: NEGATIVE

## 2022-03-02 MED ORDER — METRONIDAZOLE 0.75 % VA GEL: 1.0000 | Freq: Every day | VAGINAL | 0 refills | Status: DC

## 2022-03-02 NOTE — Progress Notes (Signed)
Patient called and needed to have script sent to [facility].Kathrene Alu RN

## 2022-03-03 LAB — PANORAMA PRENATAL TEST FULL PANEL:PANORAMA TEST PLUS 5 ADDITIONAL MICRODELETIONS: FETAL FRACTION: 4.8

## 2022-03-08 DIAGNOSIS — R87610 Atypical squamous cells of undetermined significance on cytologic smear of cervix (ASC-US): Secondary | ICD-10-CM | POA: Insufficient documentation

## 2022-03-09 ENCOUNTER — Ambulatory Visit (INDEPENDENT_AMBULATORY_CARE_PROVIDER_SITE_OTHER): Payer: Medicaid Other | Admitting: Clinical

## 2022-03-09 DIAGNOSIS — Z7189 Other specified counseling: Secondary | ICD-10-CM

## 2022-03-09 DIAGNOSIS — Z658 Other specified problems related to psychosocial circumstances: Secondary | ICD-10-CM

## 2022-03-09 NOTE — BH Specialist Note (Signed)
Integrated Behavioral Health via Telemedicine Visit  03/23/2022 Nancy Garrett [id number]  Number of Integrated Behavioral Health Clinician visits: 1- Initial Visit  Session Start time: 0845   Session End time: 0934  Total time in minutes: 49   Referring Provider: Gerrit Heck, CNM Patient/Family location: [facility] Provider location: [facility]  All persons participating in visit: [name] and [facility] Samah Lapiana   Types of Service: Individual psychotherapy and Video visit  I connected with Nancy Garrett and/or Nancy Garrett's  n/a  via  Telephone or Video Enabled Telemedicine Application  (Video is Caregility application) and verified that I am speaking with the correct person using two identifiers. Discussed confidentiality: Yes   I discussed the limitations of telemedicine and the availability of in person appointments.  Discussed there is a possibility of technology failure and discussed alternative modes of communication if that failure occurs.  I discussed that engaging in this telemedicine visit, they consent to the provision of behavioral healthcare and the services will be billed under their insurance.  Patient and/or legal guardian expressed understanding and consented to Telemedicine visit: Yes   Presenting Concerns: Patient and/or family reports the following symptoms/concerns: Ongoing back pain; processing husband's relationship with his family and impact on current family. Duration of problem: current pregnancy; Severity of problem: moderate  Patient and/or Family's Strengths/Protective Factors: Social connections, Concrete supports in place (healthy food, safe environments, etc.), Sense of purpose, and Physical Health (exercise, healthy diet, medication compliance, etc.)  Goals Addressed: Patient will:  Reduce symptoms of: anxiety and stress   Increase knowledge and/or ability of:  stress reduction   Demonstrate ability to: Increase healthy adjustment to current life circumstances  Progress towards Goals: Ongoing  Interventions: Interventions utilized:  Link to [employer] and Supportive Reflection Standardized Assessments completed: Not Needed  Patient and/or Family Response: Patient agrees with treatment plan.  Assessment: Patient currently experiencing Other specified counseling and Psychosocial stress .   Patient may benefit from continued therapeutic interventions .  Plan: Follow up with behavioral health clinician on : Two weeks Behavioral recommendations:  -Continue taking prenatal vitamin and aspirin as prescribed -Consider additional information on After Visit Summary; use as needed -Continue setting healthy boundaries with family/extended family members, as needed Referral(s): Integrated [employer] (In Clinic)  I discussed the assessment and treatment plan with the patient and/or parent/guardian. They were provided an opportunity to ask questions and all were answered. They agreed with the plan and demonstrated an understanding of the instructions.   They were advised to call back or seek an in-person evaluation if the symptoms worsen or if the condition fails to improve as anticipated.  [name], LCSW     03/09/2022    9:36 AM 02/24/2022   10:22 AM  Depression screen PHQ 2/9  Decreased Interest 0 0  Down, Depressed, Hopeless 0 0  PHQ - 2 Score 0 0  Altered sleeping 3 3  Tired, decreased energy 2 3  Change in appetite 0 3  Feeling bad or failure about yourself  0 0  Trouble concentrating 1 1  Moving slowly or fidgety/restless 0 2  Suicidal thoughts 0 0  PHQ-9 Score 6 12      03/09/2022    9:36 AM 02/24/2022   10:23 AM  GAD 7 : Generalized Anxiety Score  Nervous, Anxious, on Edge 0 0  Control/stop worrying 0 0  Worry too much - different things 0 0  Trouble relaxing 0 3  Restless 0 3  Easily annoyed  or irritable 0 1  Afraid - awful might happen 0 1  Total GAD 7 Score 0 8
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
## 2022-03-09 NOTE — Patient Instructions (Addendum)
[facility] [place], [place] [address] [phone] (main office) [phone] [facility] office)  New Parent Support Groups www.postpartum.net www.conehealthybaby.com  /Emotional Wellbeing Apps and Websites Here are a few free apps meant to help you to help yourself.  To find, try searching on the internet to see if the app is offered on Apple/Android devices. If your first choice doesn't come up on your device, the good news is that there are many choices! Play around with different apps to see which ones are helpful to you.    Calm This is an app meant to help increase calm feelings. Includes info, strategies, and tools for tracking your feelings.      Calm Harm  This app is meant to help with self-harm. Provides many 5-minute or 15-min coping strategies for doing instead of hurting yourself.       [place] is a problem-solving tool to help deal with emotions and cope with stress you encounter wherever you are.      MindShift This app can help people cope with anxiety. Rather than trying to avoid anxiety, you can make an important shift and face it.      MY3  MY3 features a support system, safety plan and resources with the goal of offering a tool to use in a time of need.       My Life My Voice  This mood journal offers a simple solution for tracking your thoughts, feelings and moods. Animated emoticons can help identify your mood.       Relax Melodies Designed to help with sleep, on this app you can mix sounds and meditations for relaxation.      Smiling Mind Smiling Mind is meditation made easy: it's a simple tool that helps put a smile on your mind.        Stop, Breathe & Think  A friendly, simple guide for people through meditations for mindfulness and compassion.  Stop, Breathe and Think Kids Enter your current feelings and choose a "mission" to help you cope. Offers  videos for certain moods instead of just sound recordings.       Team Orange The goal of this tool is to help teens change how they think, act, and react. This app helps you focus on your own good feelings and experiences.      The [place] Box The [place] Box (V[facility]) contains simple tools to help patients with coping, relaxation, distraction, and positive thinking.        BRAINSTORMING  Develop a Plan Goals: Provide a way to start conversation about your new life with a baby Assist parents in recognizing and using resources within their reach Help pave the way before birth for an easier period of transition afterwards.  Make a list of the following information to keep in a central location: Full name of Mom and Partner: _____________________________________________ 68 full name and Date of Birth: ___________________________________________ Home Address: ___________________________________________________________ ________________________________________________________________________ Home Phone: ____________________________________________________________ Parents' cell numbers: _____________________________________________________ ________________________________________________________________________ Name and contact info for OB: ______________________________________________ Name and contact info for Pediatrician:________________________________________ Contact info for Lactation Consultants: ________________________________________  REST and SLEEP *You each need at least 4-5 hours of uninterrupted sleep every day. Write specific names and contact information.* How are you going to rest in the postpartum period? While partner's home? When partner returns to work? When you both return to work? Where will your baby sleep? Who is available to help during  the day? Evening? Night? Who could move in for a period to help support you? What are some ideas to help you  get enough sleep? __________________________________________________________________________________________________________________________________________________________________________________________________________________________________________ NUTRITIOUS FOOD AND DRINK *Plan for meals before your baby is born so you can have healthy food to eat during the immediate postpartum period.* Who will look after breakfast? Lunch? Dinner? List names and contact information. Brainstorm quick, healthy ideas for each meal. What can you do before baby is born to prepare meals for the postpartum period? How can others help you with meals? Which grocery stores provide online shopping and delivery? Which restaurants offer take-out or delivery options? ______________________________________________________________________________________________________________________________________________________________________________________________________________________________________________________________________________________________________________________________________________________________________________________________________  CARE FOR MOM *It's important that mom is cared for and pampered in the postpartum period. Remember, the most important ways new mothers need care are: sleep, nutrition, gentle exercise, and time off.* Who can come take care of mom during this period? Make a list of people with their contact information. List some activities that make you feel cared for, rested, and energized? Who can make sure you have opportunities to do these things? Does mom have a space of her very own within your home that's just for her? Make a "[facility]" where she can be comfortable, rest, and renew herself  daily. ______________________________________________________________________________________________________________________________________________________________________________________________________________________________________________________________________________________________________________________________________________________________________________________________________    CARE FOR AND FEEDING BABY *Knowledgeable and encouraging people will offer the best support with regard to feeding your baby.* Educate yourself and choose the best feeding option for your baby. Make a list of people who will guide, support, and be a resource for you as your care for and feed your baby. (Friends that have breastfed or are currently breastfeeding, lactation consultants, breastfeeding support groups, etc.) Consider a postpartum doula. (These websites can give you information: dona.org & BuyingShow.es) Seek out local breastfeeding resources like the breastfeeding support group at [employer] or [employer]. ______________________________________________________________________________________________________________________________________________________________________________________________________________________________________________________________________________________________________________________________________________________________________________________________________  Verner Chol AND ERRANDS Who can help with a thorough cleaning before baby is born? Make a list of people who will help with housekeeping and chores, like laundry, light cleaning, dishes, bathrooms, etc. Who can run some errands for you? What can you do to make sure you are stocked with basic supplies before baby is born? Who is going to do the  shopping? ______________________________________________________________________________________________________________________________________________________________________________________________________________________________________________________________________________________________________________________________________________________________________________________________________     Family Adjustment *Nurture yourselves.it helps parents be more loving and allows for better bonding with their child.* What sorts of things do you and partner enjoy doing together? Which activities help you to connect and strengthen your relationship? Make a list of those things. Make a list of people whom you trust to care for your baby so you can have some time together as a couple. What types of things help partner feel connected to Mom? Make a list. What needs will partner have in order to bond with baby? Other children? Who will care for them when you go into labor and while you are in the hospital? Think about what the needs of your older children might be. Who can help you meet those needs? In what ways are you helping them prepare for bringing baby home? List some specific strategies you have for family adjustment. _______________________________________________________________________________________________________________________________________________________________________________________________________________________________________________________________________________________________________________________________________________  SUPPORT *Someone who can empathize with experiences normalizes your problems and makes them more bearable.* Make a list of other friends, neighbors, and/or co-workers you know with infants (and small children, if applicable) with whom you can connect. Make a list of local or online support groups, mom groups, etc. in which you can be  involved. ______________________________________________________________________________________________________________________________________________________________________________________________________________________________________________________________________________________________________________________________________________________________________________________________________  Childcare  Plans Investigate and plan for childcare if mom is returning to work. Talk about mom's concerns about her transition back to work. Talk about partner's concerns regarding this transition.  Mental Health *Your mental health is one of the highest priorities for a pregnant or postpartum mom.* 1 in 5 women experience anxiety and/or depression from the time of conception through the first year after birth. Postpartum Mood Disorders are the #1 complication of pregnancy and childbirth and the suffering experienced by these mothers is not necessary! These illnesses are temporary and respond well to treatment, which often includes self-care, social support, talk therapy, and medication when needed. Women experiencing anxiety and depression often say things like: "I'm supposed to be happy.why do I feel so sad?", "Why can't I snap out of it?", "I'm having thoughts that scare me." There is no need to be embarrassed if you are feeling these symptoms: Overwhelmed, anxious, angry, sad, guilty, irritable, hopeless, exhausted but can't sleep You are NOT alone. You are NOT to blame. With help, you WILL be well. Where can I find help? Medical professionals such as your OB, midwife, gynecologist, family practitioner, primary care provider, pediatrician, or mental health providers; [facility] support groups: Feelings After Birth, Breastfeeding Support Group, Baby and Me Group, and Fit 4 Two exercise classes. You have permission to ask for help. It will confirm your feelings, validate your experiences,  share/learn coping strategies, and gain support and encouragement as you heal. You are important! BRAINSTORM Make a list of local resources, including resources for mom and for partner. Identify support groups. Identify people to call late at night - include names and contact info. Talk with partner about perinatal mood and anxiety disorders. Talk with your OB, midwife, and doula about baby blues and about perinatal mood and anxiety disorders. Talk with your pediatrician about perinatal mood and anxiety disorders.   Support & Sanity Savers   What do you really need?  Basics In preparing for a new baby, many expectant parents spend hours shopping for baby clothes, decorating the nursery, and deciding which car seat to buy. Yet most don't think much about what the reality of parenting a newborn will be like, and what they need to make it through that. So, here is the advice of experienced parents. We know you'll read this, and think "they're exaggerating, I don't really need that." Just trust [place] on these, OK? Plan for all of this, and if it turns out you don't need it, come back and teach [place] how you did it!  Must-Haves (Once baby's survival needs are met, make sure you attend to your own survival needs!) Sleep An average newborn sleeps 16-18 hours per day, over 6-7 sleep periods, rarely more than three hours at a time. It is normal and healthy for a newborn to wake throughout the night... but really hard on parents!! Naps. Prioritize sleep above any responsibilities like: cleaning house, visiting friends, running errands, etc.  Sleep whenever baby sleeps. If you can't nap, at least have restful times when baby eats. The more rest you get, the more patient you will be, the more emotionally stable, and better at solving problems.  Food You may not have realized it would be difficult to eat when you have a newborn. Yet, when we talk to countless new parents, they say things like "it may be 2:00 pm  when I realize I haven't had breakfast yet." Or "every time we sit down to dinner, baby needs to eat, and my food  gets cold, so I don't bother to eat it." Finger food. Before your baby is born, stock up with one months' worth of food that: 1) you can eat with one hand while holding a baby, 2) doesn't need to be prepped, 3) is good hot or cold, 4) doesn't spoil when left out for a few hours, and 5) you like to eat. Think about: nuts, dried fruit, Clif bars, pretzels, jerky, gogurt, baby carrots, apples, bananas, crackers, cheez-n-crackers, string cheese, hot pockets or frozen burritos to microwave, garden burgers and breakfast pastries to put in the toaster, yogurt drinks, etc. Restaurant Menus. Make lists of your favorite restaurants & menu items. When family/friends want to help, you can give specific information without much thought. They can either bring you the food or send gift cards for just the right meals. Freezer Meals.  Take some time to make a few meals to put in the freezer ahead of time.  Easy to freeze meals can be anything such as soup, lasagna, chicken pie, or spaghetti sauce. Set up a Meal Schedule.  Ask friends and family to sign up to bring you meals during the first few weeks of being home. (It can be passed around at baby showers!) You have no idea how helpful this will be until you are in the throes of parenting.  https://hamilton-[email or web address]/ is a great website to check out. Emotional Support Know who to call when you're stressed out. Parenting a newborn is very challenging work. There are times when it totally overwhelms your normal coping abilities. EVERY NEW PARENT NEEDS TO HAVE A PLAN FOR WHO TO CALL WHEN THEY JUST CAN'T COPE ANY MORE. (And it has to be someone other than the baby's other parent!) Before your baby is born, come up with at least one person you can call for support - write their phone number down and post it on the refrigerator. Anxiety & Sadness. Baby blues are normal after  pregnancy; however, there are more severe types of anxiety & sadness which can occur and should not be ignored.  They are always treatable, but you have to take the first step by reaching out for help. [facility] offers a "Mom Talk" group which meets every Tuesday from 10 am - 11 am.  This group is for new moms who need support and connection after their babies are born.  Call [phone].  Really, Really Helpful (Plan for them! Make sure these happen often!!) Physical Support with Taking Care of Yourselves Asking friends and family. Before your baby is born, set up a schedule of people who can come and visit and help out (or ask a friend to schedule for you). Any time someone says "let me know what I can do to help," sign them up for a day. When they get there, their job is not to take care of the baby (that's your job and your joy). Their job is to take care of you!  Postpartum doulas. If you don't have anyone you can call on for support, look into postpartum doulas:  professionals at helping parents with caring for baby, caring for themselves, getting breastfeeding started, and helping with household tasks. www.padanc.org is a helpful website for learning about doulas in our area. Peer Support / Parent Groups Why: One of the greatest ideas for new parents is to be around other new parents. Parent groups give you a chance to share and listen to others who are going through the same season of life, get a sense  of what is normal infant development by watching several babies learn and grow, share your stories of triumph and struggles with empathetic ears, and forgive your own mistakes when you realize all parents are learning by trial and error. Where to find: There are many places you can meet other new parents throughout our community.  [facility] offers the following classes for new moms and their little ones:  Baby and Me (Birth to [place]) and Breastfeeding Support Group. Go to  www.conehealthybaby.com or call [phone] for more information. Time for your Relationship It's easy to get so caught up in meeting baby's immediate needs that it's hard to find time to connect with your partner, and meet the needs of your relationship. It's also easy to forget what "quality time with your partner" actually looks like. If you take your baby on a date, you'd be amazed how much of your couple time is spent feeding the baby, diapering the baby, admiring the baby, and talking about the baby. Dating: Try to take time for just the two of you. Babysitter tip: Sometimes when moms are breastfeeding a newborn, they find it hard to figure out how to schedule outings around baby's unpredictable feeding schedules. Have the babysitter come for a three hour period. When she comes over, if baby has just eaten, you can leave right away, and come back in two hours. If baby hasn't fed recently, you start the date at home. Once baby gets hungry and gets a good feeding in, you can head out for the rest of your date time. Date Nights at Home: If you can't get out, at least set aside one evening a week to prioritize your relationship: whenever baby dozes off or doesn't have any immediate needs, spend a little time focusing on each other. Potential conflicts: The main relationship conflicts that come up for new parents are: issues related to sexuality, financial stresses, a feeling of an unfair division of household tasks, and conflicts in parenting styles. The more you can work on these issues before baby arrives, the better!  Fun and Frills (Don't forget these. and don't feel guilty for indulging in them!) Everyone has something in life that is a fun little treat that they do just for themselves. It may be: reading the morning paper, or going for a daily jog, or having coffee with a friend once a week, or going to a movie on Friday nights, or fine chocolates, or bubble baths, or curling up with a good  book. Unless you do fun things for yourself every now and then, it's hard to have the energy for fun with your baby. Whatever your "special" treats are, make sure you find a way to continue to indulge in them after your baby is born. These special moments can recharge you, and allow you to return to baby with a new joy   PERINATAL MOOD DISORDERS: [place]   _________________________________________Emergency and Crisis Resources If you are an imminent risk to self or others, are experiencing intense personal distress, and/or have noticed significant changes in activities of daily living, call:  [place]: [phone]  [address] [place], [place], [address] Mobile Crisis: [place]: 988 Or visit the following crisis centers: Local Emergency Departments [facility]: [address] [place]. Hours: 8:30AM-5PM. Insurance Accepted: Medicaid, Medicare, and Uninsured.  [facility]:  [address], [place]  Mon-Friday 8am-3pm, [phone]  ___________ Non-Crisis Resources To identify specific providers that are covered by your insurance, Leisure centre manager company or local agencies:  [place]: [phone] [facility] and [place]: [phone] [place]: [phone] Postpartum Support International- Warm-line: (450)417-1034                                                      __Outpatient Therapy and Medication Management   Providers:  [facility]: [phone] Hours: 9AM-5PM  Insurance Accepted: Alben Spittle, Shane Crutch, [place], [place] [facility] Care): [phone] Hours: 8AM-5:30PM  nsurance Accepted: All insurances EXCEPT AARP, [place],  [place], and [place]: [phone] Hours: 8AM-8PM Insurance Accepted: Cristal Ford, Freddrick March, [place], Medicare, [phone] [facility]: [phone] Hours: 8:30AM-7PM Insurance Accepted: Cristal Ford, Medicaid, Medicare, Tricare, [employer] Counseling:  [place] Accepted:  Holland Falling, Lorella Nimrod, [employer], [place]: [phone] Hours: 9AM-5:30PM Insurance Accepted: Alben Spittle, Charlotte Crumb, and Medicaid, Medicare, [facility]:  [phone] Hours: 9am-5pm Insurance Accepted: BCBS; they do not accept Medicaid/Medicare [place]: [phone] Hours: 9am-9pm Insurance Accepted: All major insurance including Medicaid and Medicare [facility]: [phone] Hours: Welcome Accepted: All insurances EXCEPT Medicaid and Medicare. [facility]: [phone]   ____________                                                                     [place]: [phone] [place]:  [place]: (support for children in the NICU and/or with special needs), [phone]   ___________                                                                 Mental Health Support Groups Mental Health Association: [phone]    _____________                                                                                  Online Resources Postpartum Support International: http://jones-berg.com/  800-944-4PPD 2Moms Supporting Moms:  www.momssupportingmoms.net

## 2022-03-20 ENCOUNTER — Other Ambulatory Visit (INDEPENDENT_AMBULATORY_CARE_PROVIDER_SITE_OTHER): Payer: Commercial Managed Care - HMO

## 2022-03-20 ENCOUNTER — Encounter: Payer: Self-pay | Admitting: Cardiology

## 2022-03-20 ENCOUNTER — Ambulatory Visit (INDEPENDENT_AMBULATORY_CARE_PROVIDER_SITE_OTHER): Payer: Commercial Managed Care - HMO | Admitting: Cardiology

## 2022-03-20 ENCOUNTER — Ambulatory Visit: Payer: Commercial Managed Care - HMO

## 2022-03-20 VITALS — BP 100/80 | HR 71 | Ht 66.0 in | Wt 239.0 lb

## 2022-03-20 DIAGNOSIS — R002 Palpitations: Secondary | ICD-10-CM | POA: Diagnosis not present

## 2022-03-20 DIAGNOSIS — R0609 Other forms of dyspnea: Secondary | ICD-10-CM | POA: Diagnosis not present

## 2022-03-20 MED ORDER — ASPIRIN 81 MG PO TBEC: 81.0000 mg | DELAYED_RELEASE_TABLET | Freq: Every day | ORAL | 3 refills | Status: DC

## 2022-03-20 NOTE — Patient Instructions (Signed)
Medication Instructions:  Your physician has recommended you make the following change in your medication:  START: Aspirin 81 mg once daily *If you need a refill on your cardiac medications before your next appointment, please call your pharmacy*   Lab Work: None    Testing/Procedures: Your physician has requested that you have an echocardiogram. Echocardiography is a painless test that uses sound waves to create images of your heart. It provides your doctor with information about the size and shape of your heart and how well your heart's chambers and valves are working. This procedure takes approximately one hour. There are no restrictions for this procedure.  A zio monitor was ordered today. It will remain on for 14 days. You will then return monitor and event diary in provided box. It takes 1-2 weeks for report to be downloaded and returned to [place]. We will call you with the results. If monitor falls off or has orange flashing light, please call Zio for further instructions.     Follow-Up: At [facility], you and your health needs are our priority.  As part of our continuing mission to provide you with exceptional heart care, we have created designated Provider Care Teams.  These Care Teams include your primary Cardiologist (physician) and Advanced Practice Providers (APPs -  Physician Assistants and Nurse Practitioners) who all work together to provide you with the care you need, when you need it.  We recommend signing up for the patient portal called "MyChart".  Sign up information is provided on this After Visit Summary.  MyChart is used to connect with patients for Virtual Visits (Telemedicine).  Patients are able to view lab/test results, encounter notes, upcoming appointments, etc.  Non-urgent messages can be sent to your provider as well.   To learn more about what you can do with MyChart, go to [email or web address].    Your next appointment:   12 week(s)  The format  for your next appointment:   In Person  Provider:   Berniece Salines  [facility] [address] [place], [place] [address]   Other Instructions   Important Information About Sugar

## 2022-03-20 NOTE — Progress Notes (Signed)
Cardio-Obstetrics Clinic  New Evaluation  Date:  03/26/2022   ID:  [name], DOB [birth year], MRN [id number]  PCP:  Sandrea Hughs, NP   [place] [facility] Cardiologist:  Berniece Salines, DO  Electrophysiologist:  None       Referring MD: Sandrea Hughs, NP   Chief Complaint: " I am having palpitations"  History of Present Illness:    Nancy Garrett is a 31 y.o. female [G2P1001] who is being seen today for the evaluation of shortness of breath and palpitations at the request of Ngetich, Dinah C, NP.   Medical history includes history of pre-eclampsia in her first pregnancy which was 6 years ago, gestational diabetes and headaches. Here today with her husband and daughter to be evaluated for dyspnea on exertion and palpitations.  The patient tells me that she has been experiencing intermittent palpitations described as abrupt onset of fast heartbeat which last for minutes at a time.  Is very limiting.  What really is bothersome is the fact that she is experiencing worsening shortness of breath on exertion.  Little things as going to take a shower makes her so short of breath.  She even notes just also standing in the kitchen cooking meals she gets so short of breath.  She is really worried about this.  Prior CV Studies Reviewed: The following studies were reviewed today:   Past Medical History:  Diagnosis Date   Chronic abdominal pain    Head ache     Past Surgical History:  Procedure Laterality Date   GASTRECTOMY  2019   Dr. Jeb Levering.   LAPAROSCOPIC GASTRIC SLEEVE RESECTION     TONSILLECTOMY        OB History     Gravida  2   Para  1   Term  1   Preterm      AB      Living  1      SAB      IAB      Ectopic      Multiple      Live Births  1               Current Medications: Current Meds  Medication Sig   aspirin EC 81 MG tablet Take 1 tablet (81 mg total) by mouth daily. Swallow whole.   Prenatal Vit-Fe  Fumarate-FA (MULTIVITAMIN-PRENATAL) 27-0.8 MG TABS tablet Take 1 tablet by mouth daily at 12 noon.     Allergies:   Iodinated contrast media and Ivp dye [iodinated contrast media]   Social History   Socioeconomic History   Marital status: Married    Spouse name: Not on file   Number of children: Not on file   Years of education: Not on file   Highest education level: Not on file  Occupational History   Not on file  Tobacco Use   Smoking status: Never   Smokeless tobacco: Never  Vaping Use   Vaping Use: Never used  Substance and Sexual Activity   Alcohol use: Yes    Alcohol/week: 2.0 standard drinks of alcohol    Types: 2 Glasses of wine per week   Drug use: Never   Sexual activity: Yes    Birth control/protection: I.U.D.  Other Topics Concern   Not on file  Social History Narrative   ** Merged History Encounter **       Tobacco use, amount per day now: Past tobacco use, amount per day: How many years did you  use tobacco: Alcohol use (drinks per week): Diet: Do you drink/eat things with caffeine: No Marital status:   Married                               What year    were you married? 2022 Do you live in a house, apartment, assisted living, condo, trailer, etc.? House Is it one or more stories? One How many persons live in your home? 3  Do you have pets in your home?( please list) No Highest Level of education c   ompleted? College Current or past profession: Associate Professor, Scientist, research (physical sciences). Do you exercise? No                                 Type and how often? Do you have a living will? Yes Do you have a DNR form?   No                               If not, do you    want to discuss one? Do you have signed POA/HPOA forms?    No                    If so, please bring to you appointment  Do you have any difficulty bathing or dressing yourself? Yes Do you have any difficulty preparing food or eating? No Do you hav   e any difficulty managing your medications? No Do  you have any difficulty managing your finances? No Do you have any difficulty affording your medications? No   Social Determinants of Health   Financial Resource Strain: Not on file  Food Insecurity: Food Insecurity Present (03/09/2022)   Hunger Vital Sign    Worried About Running Out of Food in the Last Year: Never true    Ran Out of Food in the Last Year: Sometimes true  Transportation Needs: No Transportation Needs (03/09/2022)   PRAPARE - Administrator, Civil Service (Medical): No    Lack of Transportation (Non-Medical): No  Physical Activity: Not on file  Stress: Not on file  Social Connections: Not on file      Family History  Problem Relation Age of Onset   High blood pressure Mother    Diabetes Father    Diabetes Sister    Stroke Sister    High blood pressure Sister       ROS:   Please see the history of present illness.    Reports palpiations and shortness of breath All other systems reviewed and are negative.   Labs/EKG Reviewed:    EKG:   EKG is was ordered today.  The ekg ordered today demonstrates NSR HR   Recent Labs: 02/24/2022: Hemoglobin 12.9; Platelets 214 03/23/2022: ALT 8; BUN 7; Creatinine, Ser 0.60; Potassium 4.7; Sodium 136; TSH 1.010   Recent Lipid Panel Lab Results  Component Value Date/Time   CHOL 255 (H) 09/22/2021 11:10 AM   TRIG 49 09/22/2021 11:10 AM   HDL 70 09/22/2021 11:10 AM   CHOLHDL 3.6 09/22/2021 11:10 AM   LDLCALC 170 (H) 09/22/2021 11:10 AM    Physical Exam:    VS:  BP 100/80 (BP Location: Right Arm, Patient Position: Sitting, Cuff Size: Normal)   Pulse 71   Ht 5\' 6"  (1.676 m)  Wt 239 lb (108.4 kg)   LMP 12/17/2021   SpO2 99%   BMI 38.58 kg/m     Wt Readings from Last 3 Encounters:  03/23/22 234 lb (106.1 kg)  03/20/22 239 lb (108.4 kg)  02/24/22 237 lb (107.5 kg)     GEN:  Well nourished, well developed in no acute distress HEENT: Normal NECK: No JVD; No carotid bruits LYMPHATICS: No  lymphadenopathy CARDIAC: RRR, no murmurs, rubs, gallops RESPIRATORY:  Clear to auscultation without rales, wheezing or rhonchi  ABDOMEN: Soft, non-tender, non-distended MUSCULOSKELETAL:  No edema; No deformity  SKIN: Warm and dry NEUROLOGIC:  Alert and oriented x 3 PSYCHIATRIC:  Normal affect    Risk Assessment/Risk Calculators:     CARPREG II Risk Prediction Index Score:  1.  The patient's risk for a primary cardiac event is 5%.            ASSESSMENT & PLAN:     She has been started previously on aspirin however she had not started this medication because she did tell me it was her understanding from her previous provider that the aspirin was for headache.  I explained to the patient that her aspirin 81 mg is for preeclampsia prophylaxis this will be beneficial for her to take this medication.  She is agreeable to do that.  I would like to rule out a cardiovascular etiology of this palpitation, therefore at this time I would like to placed a zio patch for 14 days. In additon with her worsening dyspnea on exertion a transthoracic echocardiogram will be ordered to assess LV/RV function and any structural abnormalities. Once these testing have been performed amd reviewed further reccomendations will be made. For now, I do reccomend that the patient goes to the nearest ED if  symptoms recur.  Patient Instructions  Medication Instructions:  Your physician has recommended you make the following change in your medication:  START: Aspirin 81 mg once daily *If you need a refill on your cardiac medications before your next appointment, please call your pharmacy*   Lab Work: None    Testing/Procedures: Your physician has requested that you have an echocardiogram. Echocardiography is a painless test that uses sound waves to create images of your heart. It provides your doctor with information about the size and shape of your heart and how well your heart's chambers and valves are  working. This procedure takes approximately one hour. There are no restrictions for this procedure.  A zio monitor was ordered today. It will remain on for 14 days. You will then return monitor and event diary in provided box. It takes 1-2 weeks for report to be downloaded and returned to [place]. We will call you with the results. If monitor falls off or has orange flashing light, please call Zio for further instructions.     Follow-Up: At [facility], you and your health needs are our priority.  As part of our continuing mission to provide you with exceptional heart care, we have created designated Provider Care Teams.  These Care Teams include your primary Cardiologist (physician) and Advanced Practice Providers (APPs -  Physician Assistants and Nurse Practitioners) who all work together to provide you with the care you need, when you need it.  We recommend signing up for the patient portal called "MyChart".  Sign up information is provided on this After Visit Summary.  MyChart is used to connect with patients for Virtual Visits (Telemedicine).  Patients are able to view lab/test results, encounter notes,  upcoming appointments, etc.  Non-urgent messages can be sent to your provider as well.   To learn more about what you can do with MyChart, go to [email or web address].    Your next appointment:   12 week(s)  The format for your next appointment:   In Person  Provider:   Thomasene Ripple  [facility] [address], [place], [place] [address]   Other Instructions   Important Information About Sugar         Dispo:  No follow-ups on file.   Medication Adjustments/Labs and Tests Ordered: Current medicines are reviewed at length with the patient today.  Concerns regarding medicines are outlined above.  Tests Ordered: Orders Placed This Encounter  Procedures   LONG TERM MONITOR (3-14 DAYS)   EKG 12-Lead   ECHOCARDIOGRAM COMPLETE   Medication Changes: Meds ordered  this encounter  Medications   aspirin EC 81 MG tablet    Sig: Take 1 tablet (81 mg total) by mouth daily. Swallow whole.    Dispense:  90 tablet    Refill:  3

## 2022-03-23 ENCOUNTER — Ambulatory Visit (INDEPENDENT_AMBULATORY_CARE_PROVIDER_SITE_OTHER): Payer: Commercial Managed Care - HMO | Admitting: Obstetrics & Gynecology

## 2022-03-23 ENCOUNTER — Ambulatory Visit (INDEPENDENT_AMBULATORY_CARE_PROVIDER_SITE_OTHER): Payer: Commercial Managed Care - HMO | Admitting: Clinical

## 2022-03-23 ENCOUNTER — Encounter: Payer: Self-pay | Admitting: Obstetrics & Gynecology

## 2022-03-23 VITALS — BP 104/51 | HR 82 | Wt 234.0 lb

## 2022-03-23 DIAGNOSIS — O99891 Other specified diseases and conditions complicating pregnancy: Secondary | ICD-10-CM

## 2022-03-23 DIAGNOSIS — O0991 Supervision of high risk pregnancy, unspecified, first trimester: Secondary | ICD-10-CM

## 2022-03-23 DIAGNOSIS — Z7189 Other specified counseling: Secondary | ICD-10-CM

## 2022-03-23 DIAGNOSIS — Z3A13 13 weeks gestation of pregnancy: Secondary | ICD-10-CM

## 2022-03-23 DIAGNOSIS — O0990 Supervision of high risk pregnancy, unspecified, unspecified trimester: Secondary | ICD-10-CM

## 2022-03-23 DIAGNOSIS — Z658 Other specified problems related to psychosocial circumstances: Secondary | ICD-10-CM

## 2022-03-23 DIAGNOSIS — M549 Dorsalgia, unspecified: Secondary | ICD-10-CM

## 2022-03-23 NOTE — Progress Notes (Signed)
PRENATAL VISIT NOTE  Subjective:  Nancy Garrett is a 31 y.o. G2P1001 at 44w5dbeing seen today for ongoing prenatal care.  She is currently monitored for the following issues for this high-risk pregnancy and has Obesity, Class III, BMI 40-49.9 (morbid obesity) ([place]; Facet degeneration of lumbar region; Obesity in pregnancy, antepartum; H/O gastric sleeve; History of gestational diabetes; Hx of preeclampsia, prior pregnancy, currently pregnant, first trimester; Supervision of high risk pregnancy, antepartum; and ASCUS of cervix with negative high risk HPV on their problem list.  Patient reports  continued back pain issues, had this before pregnancy and was seen by specialists.  Recently seen by Dr. THarriet Masson(cardiology) for palpitations and syncope, has Zio monitor in place .  Contractions: Not present. Vag. Bleeding: None.   . Denies leaking of fluid.   The following portions of the patient's history were reviewed and updated as appropriate: allergies, current medications, past family history, past medical history, past social history, past surgical history and problem list.   Objective:   Vitals:   03/23/22 1101  BP: (!) 104/51  Pulse: 82  Weight: 234 lb (106.1 kg)    Fetal Status: Fetal Heart Rate (bpm): 145         General:  Alert, oriented and cooperative. Patient is in no acute distress.  Skin: Skin is warm and dry. No rash noted.   Cardiovascular: Normal heart rate noted  Respiratory: Normal respiratory effort, no problems with respiration noted  Abdomen: Soft, gravid, appropriate for gestational age.  Pain/Pressure: Present     Pelvic: Cervical exam deferred        Extremities: Normal range of motion.  Edema: None  Mental Status: Normal mood and affect. Normal behavior. Normal judgment and thought content.   Assessment and Plan:  Pregnancy: G2P1001 at 127w5d. Back pain affecting pregnancy in first trimester Patient declined PT referral, said this made it worse. She was  encouraged to make appointment with her past specialists as this will get worse during pregnancy.   2. [redacted] weeks gestation of pregnancy 3. Supervision of high risk pregnancy, antepartum Labs ordered by Cardiologist. - Comp Met (CMET) - TSH No other complaints or concerns.  Routine obstetric precautions reviewed. Please refer to After Visit Summary for other counseling recommendations.   Return in about 4 weeks (around 04/20/2022) for OFFICE OB VISIT (MD or APP).  Future Appointments  Date Time Provider DeBrookings10/02/2022  1:45 PM [facility]10/25/2023  1:00 PM ChGenia HaroldMD GNA-GNA None  04/10/2022 12:50 PM MC-CV [facility] ECHO 4 [facility]CHO [facility]  04/21/2022  9:35 AM WiSeabron SpatesCNM CWH-WMHP None  05/04/2022 10:15 AM [facility]URSE [facility]11/20/2023 10:30 AM [facility] US3 [facility]12/11/2021 10:35 AM StTruett MainlandDO CWH-WMHP None  06/05/2022  2:20 PM Tobb, KaGodfrey PickDO CVD-WMC None  09/28/2022  8:40 AM Ngetich, DiNelda BucksNP PSC-PSC None    UgVerita SchneidersMD

## 2022-03-23 NOTE — Patient Instructions (Signed)
Center for [facility] at [facility] [place], [place] [address] [phone] (main office) [phone] [facility] office)  New Parent Support Groups www.postpartum.net www.conehealthybaby.com (This site also has "Baby and Me" group)     BRAINSTORMING  Develop a Plan Goals: Provide a way to start conversation about your new life with a baby Assist parents in recognizing and using resources within their reach Help pave the way before birth for an easier period of transition afterwards.  Make a list of the following information to keep in a central location: Full name of Mom and Partner: _____________________________________________ 32 full name and Date of Birth: ___________________________________________ Home Address: ___________________________________________________________ ________________________________________________________________________ Home Phone: ____________________________________________________________ Parents' cell numbers: _____________________________________________________ ________________________________________________________________________ Name and contact info for OB: ______________________________________________ Name and contact info for Pediatrician:________________________________________ Contact info for Lactation Consultants: ________________________________________  REST and SLEEP *You each need at least 4-5 hours of uninterrupted sleep every day. Write specific names and contact information.* How are you going to rest in the postpartum period? While partner's home? When partner returns to work? When you both return to work? Where will your baby sleep? Who is available to help during the day? Evening? Night? Who could move in for a period to help support you? What are some ideas to help you get enough  sleep? __________________________________________________________________________________________________________________________________________________________________________________________________________________________________________ NUTRITIOUS FOOD AND DRINK *Plan for meals before your baby is born so you can have healthy food to eat during the immediate postpartum period.* Who will look after breakfast? Lunch? Dinner? List names and contact information. Brainstorm quick, healthy ideas for each meal. What can you do before baby is born to prepare meals for the postpartum period? How can others help you with meals? Which grocery stores provide online shopping and delivery? Which restaurants offer take-out or delivery options? ______________________________________________________________________________________________________________________________________________________________________________________________________________________________________________________________________________________________________________________________________________________________________________________________________  CARE FOR MOM *It's important that mom is cared for and pampered in the postpartum period. Remember, the most important ways new mothers need care are: sleep, nutrition, gentle exercise, and time off.* Who can come take care of mom during this period? Make a list of people with their contact information. List some activities that make you feel cared for, rested, and energized? Who can make sure you have opportunities to do these things? Does mom have a space of her very own within your home that's just for her? Make a "[facility]" where she can be comfortable, rest, and renew herself  daily. ______________________________________________________________________________________________________________________________________________________________________________________________________________________________________________________________________________________________________________________________________________________________________________________________________    CARE FOR AND FEEDING BABY *Knowledgeable and encouraging people will offer the best support with regard to feeding your baby.* Educate yourself and choose the best feeding option for your baby. Make a list of people who will guide, support, and be a resource for you as your care for and feed your baby. (Friends that have breastfed or are currently breastfeeding, lactation consultants, breastfeeding support groups, etc.) Consider a postpartum doula. (These websites can give you information: dona.org & BuyingShow.es) Seek out local breastfeeding resources like the breastfeeding support group at [employer] or [employer]. ______________________________________________________________________________________________________________________________________________________________________________________________________________________________________________________________________________________________________________________________________________________________________________________________________  Verner Chol AND ERRANDS Who can help with a thorough cleaning before baby is born? Make a list of people who will help with housekeeping and chores, like laundry, light cleaning, dishes, bathrooms, etc. Who can run some errands for you? What can you do to make sure you are stocked with basic supplies before baby is born? Who is going to do the  shopping? ______________________________________________________________________________________________________________________________________________________________________________________________________________________________________________________________________________________________________________________________________________________________________________________________________     Family Adjustment *Nurture yourselves.it helps parents be more loving and allows  for better bonding with their child.* What sorts of things do you and partner enjoy doing together? Which activities help you to connect and strengthen your relationship? Make a list of those things. Make a list of people whom you trust to care for your baby so you can have some time together as a couple. What types of things help partner feel connected to Mom? Make a list. What needs will partner have in order to bond with baby? Other children? Who will care for them when you go into labor and while you are in the hospital? Think about what the needs of your older children might be. Who can help you meet those needs? In what ways are you helping them prepare for bringing baby home? List some specific strategies you have for family adjustment. _______________________________________________________________________________________________________________________________________________________________________________________________________________________________________________________________________________________________________________________________________________  SUPPORT *Someone who can empathize with experiences normalizes your problems and makes them more bearable.* Make a list of other friends, neighbors, and/or co-workers you know with infants (and small children, if applicable) with whom you can connect. Make a list of local or online support groups, mom groups, etc. in which you can be  involved. ______________________________________________________________________________________________________________________________________________________________________________________________________________________________________________________________________________________________________________________________________________________________________________________________________  Childcare Plans Investigate and plan for childcare if mom is returning to work. Talk about mom's concerns about her transition back to work. Talk about partner's concerns regarding this transition.  Mental Health *Your mental health is one of the highest priorities for a pregnant or postpartum mom.* 1 in 5 women experience anxiety and/or depression from the time of conception through the first year after birth. Postpartum Mood Disorders are the #1 complication of pregnancy and childbirth and the suffering experienced by these mothers is not necessary! These illnesses are temporary and respond well to treatment, which often includes self-care, social support, talk therapy, and medication when needed. Women experiencing anxiety and depression often say things like: "I'm supposed to be happy.why do I feel so sad?", "Why can't I snap out of it?", "I'm having thoughts that scare me." There is no need to be embarrassed if you are feeling these symptoms: Overwhelmed, anxious, angry, sad, guilty, irritable, hopeless, exhausted but can't sleep You are NOT alone. You are NOT to blame. With help, you WILL be well. Where can I find help? Medical professionals such as your OB, midwife, gynecologist, family practitioner, primary care provider, pediatrician, or mental health providers; [facility] support groups: Feelings After Birth, Breastfeeding Support Group, Baby and Me Group, and Fit 4 Two exercise classes. You have permission to ask for help. It will confirm your feelings, validate your experiences,  share/learn coping strategies, and gain support and encouragement as you heal. You are important! BRAINSTORM Make a list of local resources, including resources for mom and for partner. Identify support groups. Identify people to call late at night - include names and contact info. Talk with partner about perinatal mood and anxiety disorders. Talk with your OB, midwife, and doula about baby blues and about perinatal mood and anxiety disorders. Talk with your pediatrician about perinatal mood and anxiety disorders.   Support & Sanity Savers   What do you really need?  Basics In preparing for a new baby, many expectant parents spend hours shopping for baby clothes, decorating the nursery, and deciding which car seat to buy. Yet most don't think much about what the reality of parenting a newborn will be like, and what they need to make it through that. So, here is the advice of experienced parents. We know you'll read this, and think "they're exaggerating,  I don't really need that." Just trust [place] on these, OK? Plan for all of this, and if it turns out you don't need it, come back and teach [place] how you did it!  Must-Haves (Once baby's survival needs are met, make sure you attend to your own survival needs!) Sleep An average newborn sleeps 16-18 hours per day, over 6-7 sleep periods, rarely more than three hours at a time. It is normal and healthy for a newborn to wake throughout the night... but really hard on parents!! Naps. Prioritize sleep above any responsibilities like: cleaning house, visiting friends, running errands, etc.  Sleep whenever baby sleeps. If you can't nap, at least have restful times when baby eats. The more rest you get, the more patient you will be, the more emotionally stable, and better at solving problems.  Food You may not have realized it would be difficult to eat when you have a newborn. Yet, when we talk to countless new parents, they say things like "it may be 2:00 pm  when I realize I haven't had breakfast yet." Or "every time we sit down to dinner, baby needs to eat, and my food gets cold, so I don't bother to eat it." Finger food. Before your baby is born, stock up with one months' worth of food that: 1) you can eat with one hand while holding a baby, 2) doesn't need to be prepped, 3) is good hot or cold, 4) doesn't spoil when left out for a few hours, and 5) you like to eat. Think about: nuts, dried fruit, Clif bars, pretzels, jerky, gogurt, baby carrots, apples, bananas, crackers, cheez-n-crackers, string cheese, hot pockets or frozen burritos to microwave, garden burgers and breakfast pastries to put in the toaster, yogurt drinks, etc. Restaurant Menus. Make lists of your favorite restaurants & menu items. When family/friends want to help, you can give specific information without much thought. They can either bring you the food or send gift cards for just the right meals. Freezer Meals.  Take some time to make a few meals to put in the freezer ahead of time.  Easy to freeze meals can be anything such as soup, lasagna, chicken pie, or spaghetti sauce. Set up a Meal Schedule.  Ask friends and family to sign up to bring you meals during the first few weeks of being home. (It can be passed around at baby showers!) You have no idea how helpful this will be until you are in the throes of parenting.  https://hamilton-woodard.com/ is a great website to check out. Emotional Support Know who to call when you're stressed out. Parenting a newborn is very challenging work. There are times when it totally overwhelms your normal coping abilities. EVERY NEW PARENT NEEDS TO HAVE A PLAN FOR WHO TO CALL WHEN THEY JUST CAN'T COPE ANY MORE. (And it has to be someone other than the baby's other parent!) Before your baby is born, come up with at least one person you can call for support - write their phone number down and post it on the refrigerator. Anxiety & Sadness. Baby blues are normal after  pregnancy; however, there are more severe types of anxiety & sadness which can occur and should not be ignored.  They are always treatable, but you have to take the first step by reaching out for help. [facility] offers a "Mom Talk" group which meets every Tuesday from 10 am - 11 am.  This group is for new moms who need support and connection  after their babies are born.  Call [phone].  Really, Really Helpful (Plan for them! Make sure these happen often!!) Physical Support with Taking Care of Yourselves Asking friends and family. Before your baby is born, set up a schedule of people who can come and visit and help out (or ask a friend to schedule for you). Any time someone says "let me know what I can do to help," sign them up for a day. When they get there, their job is not to take care of the baby (that's your job and your joy). Their job is to take care of you!  Postpartum doulas. If you don't have anyone you can call on for support, look into postpartum doulas:  professionals at helping parents with caring for baby, caring for themselves, getting breastfeeding started, and helping with household tasks. www.padanc.org is a helpful website for learning about doulas in our area. Peer Support / Parent Groups Why: One of the greatest ideas for new parents is to be around other new parents. Parent groups give you a chance to share and listen to others who are going through the same season of life, get a sense of what is normal infant development by watching several babies learn and grow, share your stories of triumph and struggles with empathetic ears, and forgive your own mistakes when you realize all parents are learning by trial and error. Where to find: There are many places you can meet other new parents throughout our community.  [facility] offers the following classes for new moms and their little ones:  Baby and Me (Birth to [place]) and Breastfeeding Support Group. Go to  www.conehealthybaby.com or call [phone] for more information. Time for your Relationship It's easy to get so caught up in meeting baby's immediate needs that it's hard to find time to connect with your partner, and meet the needs of your relationship. It's also easy to forget what "quality time with your partner" actually looks like. If you take your baby on a date, you'd be amazed how much of your couple time is spent feeding the baby, diapering the baby, admiring the baby, and talking about the baby. Dating: Try to take time for just the two of you. Babysitter tip: Sometimes when moms are breastfeeding a newborn, they find it hard to figure out how to schedule outings around baby's unpredictable feeding schedules. Have the babysitter come for a three hour period. When she comes over, if baby has just eaten, you can leave right away, and come back in two hours. If baby hasn't fed recently, you start the date at home. Once baby gets hungry and gets a good feeding in, you can head out for the rest of your date time. Date Nights at Home: If you can't get out, at least set aside one evening a week to prioritize your relationship: whenever baby dozes off or doesn't have any immediate needs, spend a little time focusing on each other. Potential conflicts: The main relationship conflicts that come up for new parents are: issues related to sexuality, financial stresses, a feeling of an unfair division of household tasks, and conflicts in parenting styles. The more you can work on these issues before baby arrives, the better!  Fun and Frills (Don't forget these. and don't feel guilty for indulging in them!) Everyone has something in life that is a fun little treat that they do just for themselves. It may be: reading the morning paper, or going for a daily jog, or having  coffee with a friend once a week, or going to a movie on Friday nights, or fine chocolates, or bubble baths, or curling up with a good  book. Unless you do fun things for yourself every now and then, it's hard to have the energy for fun with your baby. Whatever your "special" treats are, make sure you find a way to continue to indulge in them after your baby is born. These special moments can recharge you, and allow you to return to baby with a new joy   PERINATAL MOOD DISORDERS: [place]   _________________________________________Emergency and Crisis Resources If you are an imminent risk to self or others, are experiencing intense personal distress, and/or have noticed significant changes in activities of daily living, call:  [place]: [phone]  [address], [place], [place], [address] Mobile Crisis: [place]: 988 Or visit the following crisis centers: Local Emergency Departments [facility]: [address], [place]. Hours: 8:30AM-5PM. Insurance Accepted: Medicaid, Medicare, and Uninsured.  [facility]:  [address] [place]  Mon-Friday 8am-3pm, [phone]                                                                                  ___________ Non-Crisis Resources To identify specific providers that are covered by your insurance, contact your insurance company or local agencies:  [place]: [phone] [facility] [employer]: [place]: [phone] Postpartum Support International- Warm-line: 2208457663                                                      __Outpatient Therapy and Medication Management   Providers:  [facility]: [phone] Hours: 9AM-5PM  Insurance Accepted: Alben Spittle, Shane Crutch, [place], [place] [facility] Care): [phone] Hours: 8AM-5:30PM  nsurance Accepted: All insurances EXCEPT AARP, [place],  [place], and [place]: [phone] Hours: 8AM-8PM Insurance Accepted: Cristal Ford, Freddrick March, [place], Medicare, [phone] [facility]: [phone] Hours: 8:30AM-7PM Insurance Accepted: Cristal Ford, Medicaid, Medicare, Tricare, [employer] Counseling:  [place] Accepted:  Holland Falling, Lorella Nimrod, [facility], [place]: [phone] Hours: 9AM-5:30PM Insurance Accepted: Alben Spittle, Charlotte Crumb, and Medicaid, Medicare, [facility]:  [phone] Hours: 9am-5pm Insurance Accepted: BCBS; they do not accept Medicaid/Medicare [facility] [place]: [phone] Hours: 9am-9pm Insurance Accepted: All major insurance including Medicaid and Medicare [facility]: [phone] Hours: [place] Accepted: All insurances EXCEPT Medicaid and Medicare. [facility]: [phone]   ____________  [facility] [place]: [phone] [place]:  [place]: (support for children in the NICU and/or with special needs), [phone]   ___________                                                                 Mental Health Support Groups Mental Health Association: [phone]    _____________                                                                                  Online Resources Postpartum Support International: http://jones-berg.com/  800-944-4PPD 2Moms Supporting Moms:  www.momssupportingmoms.net

## 2022-03-23 NOTE — Progress Notes (Signed)
Patient complaining of back pain- has had previous issues with L5-L6 back issues. Kathrene Alu RN

## 2022-03-24 LAB — COMPREHENSIVE METABOLIC PANEL
ALT: 8 IU/L (ref 0–32)
AST: 10 IU/L (ref 0–40)
Albumin/Globulin Ratio: 1.6 (ref 1.2–2.2)
Albumin: 4.2 g/dL (ref 3.9–4.9)
Alkaline Phosphatase: 83 IU/L (ref 44–121)
BUN/Creatinine Ratio: 12 (ref 9–23)
BUN: 7 mg/dL (ref 6–20)
Bilirubin Total: 0.4 mg/dL (ref 0.0–1.2)
CO2: 22 mmol/L (ref 20–29)
Calcium: 9.6 mg/dL (ref 8.7–10.2)
Chloride: 100 mmol/L (ref 96–106)
Creatinine, Ser: 0.6 mg/dL (ref 0.57–1.00)
Globulin, Total: 2.6 g/dL (ref 1.5–4.5)
Glucose: 81 mg/dL (ref 70–99)
Potassium: 4.7 mmol/L (ref 3.5–5.2)
Sodium: 136 mmol/L (ref 134–144)
Total Protein: 6.8 g/dL (ref 6.0–8.5)
eGFR: 122 mL/min/{1.73_m2} (ref >59)

## 2022-03-24 LAB — TSH: TSH: 1.01 u[IU]/mL (ref 0.450–4.500)

## 2022-04-06 ENCOUNTER — Ambulatory Visit (INDEPENDENT_AMBULATORY_CARE_PROVIDER_SITE_OTHER): Payer: Commercial Managed Care - HMO | Admitting: Clinical

## 2022-04-06 ENCOUNTER — Ambulatory Visit: Payer: PRIVATE HEALTH INSURANCE | Admitting: Psychiatry

## 2022-04-06 DIAGNOSIS — Z658 Other specified problems related to psychosocial circumstances: Secondary | ICD-10-CM

## 2022-04-06 DIAGNOSIS — Z7189 Other specified counseling: Secondary | ICD-10-CM

## 2022-04-06 NOTE — BH Specialist Note (Signed)
Integrated Behavioral Health via Telemedicine Visit  04/06/2022 Nancy Garrett [id number]  Number of Integrated Behavioral Health Clinician visits: 3- Third Visit  Session Start time: 1558   Session End time: 1634  Total time in minutes: 36   Referring Provider: Gerrit Heck, CNM Patient/Family location: Home [facility] Provider location: [facility]  All persons participating in visit: [name] and Nancy Garrett   Types of Service: Individual psychotherapy and Video visit  I connected with [name] and/or Dayton Scrape Sposito's  n/a  via  Telephone or Video Enabled Telemedicine Application  (Video is Caregility application) and verified that I am speaking with the correct person using two identifiers. Discussed confidentiality: Yes   I discussed the limitations of telemedicine and the availability of in person appointments.  Discussed there is a possibility of technology failure and discussed alternative modes of communication if that failure occurs.  I discussed that engaging in this telemedicine visit, they consent to the provision of behavioral healthcare and the services will be billed under their insurance.  Patient and/or legal guardian expressed understanding and consented to Telemedicine visit: Yes   Presenting Concerns: Patient and/or family reports the following symptoms/concerns: Conflict with husband's family not respecting her boundaries. Duration of problem: Current pregnancy; Severity of problem: moderate  Patient and/or Family's Strengths/Protective Factors: Social connections, Social and Emotional competence, Concrete supports in place (healthy food, safe environments, etc.), Sense of purpose, and Physical Health (exercise, healthy diet, medication compliance, etc.)  Goals Addressed: Patient will:  Reduce symptoms of: stress    Demonstrate ability to: Increase healthy adjustment to  current life circumstances  Progress towards Goals: Ongoing  Interventions: Interventions utilized:  Supportive Reflection Standardized Assessments completed: Not Needed  Patient and/or Family Response: Patient agrees with treatment plan.   Assessment: Patient currently experiencing Other specified counseling and Psychosocial stress .   Patient may benefit from continued therapeutic interventions .  Plan: Follow up with behavioral health clinician on : Two weeks Behavioral recommendations:  -Continue taking prenatal vitamin and aspirin as prescribed -Continue setting healthy boundaries; advocating for respectful treatment by others Referral(s): Integrated [employer] (In Clinic)  I discussed the assessment and treatment plan with the patient and/or parent/guardian. They were provided an opportunity to ask questions and all were answered. They agreed with the plan and demonstrated an understanding of the instructions.   They were advised to call back or seek an in-person evaluation if the symptoms worsen or if the condition fails to improve as anticipated.  [name], LCSW     03/09/2022    9:36 AM 02/24/2022   10:22 AM  Depression screen PHQ 2/9  Decreased Interest 0 0  Down, Depressed, Hopeless 0 0  PHQ - 2 Score 0 0  Altered sleeping 3 3  Tired, decreased energy 2 3  Change in appetite 0 3  Feeling bad or failure about yourself  0 0  Trouble concentrating 1 1  Moving slowly or fidgety/restless 0 2  Suicidal thoughts 0 0  PHQ-9 Score 6 12      03/09/2022    9:36 AM 02/24/2022   10:23 AM  GAD 7 : Generalized Anxiety Score  Nervous, Anxious, on Edge 0 0  Control/stop worrying 0 0  Worry too much - different things 0 0  Trouble relaxing 0 3  Restless 0 3  Easily annoyed or irritable 0 1  Afraid - awful might happen 0 1  Total GAD  7 Score 0 8

## 2022-04-07 NOTE — BH Specialist Note (Addendum)
Integrated Behavioral Health via Telemedicine Visit  05/25/2022 Nancy Garrett [id number]  Number of Integrated Behavioral Health Clinician visits: 4- Fourth Visit  Session Start time: 1446   Session End time: 1541  Total time in minutes: 55   Referring Provider: Gerrit Heck, CNM Patient/Family location: Home [facility] Provider location: [facility]  All persons participating in visit: [name] and [facility] Nancy Garrett   Types of Service: Individual psychotherapy and Video visit  I connected with Nancy Garrett and/or Nancy Garrett's  n/a  via  Telephone or Video Enabled Telemedicine Application  (Video is Caregility application) and verified that I am speaking with the correct person using two identifiers. Discussed confidentiality: Yes   I discussed the limitations of telemedicine and the availability of in person appointments.  Discussed there is a possibility of technology failure and discussed alternative modes of communication if that failure occurs.  I discussed that engaging in this telemedicine visit, they consent to the provision of behavioral healthcare and the services will be billed under their insurance.  Patient and/or legal guardian expressed understanding and consented to Telemedicine visit: Yes   Presenting Concerns: Patient and/or family reports the following symptoms/concerns: Processing feelings regarding setting boundaries with family (own and in-laws); increased self-isolation as protection and creating emotional safety and wellbeing of household.  Duration of problem: Current pregnancy; Severity of problem: moderate  Patient and/or Family's Strengths/Protective Factors: Social connections, Concrete supports in place (healthy food, safe environments, etc.), Sense of purpose, and Physical Health (exercise, healthy diet, medication compliance, etc.)  Goals Addressed: Patient will:   Reduce symptoms of: anxiety and stress    Demonstrate ability to: Increase healthy adjustment to current life circumstances  Progress towards Goals: Ongoing  Interventions: Interventions utilized:  Supportive Reflection Standardized Assessments completed: Not Needed  Patient and/or Family Response: Patient agrees with treatment plan.   Assessment: Patient currently experiencing Adjustment disorder with anxious mood and Psychosocial stress.   Patient may benefit from continued therapeutic interventions.  Plan: Follow up with behavioral health clinician on : Three weeks Behavioral recommendations:  -Continue taking prenatal vitamin daily -Continue setting healthy boundaries as needed, for improved peace of mind  -Continue prioritizing healthy self-care (regular meals, adequate rest) daily  Referral(s): Integrated [employer] (In Clinic)  I discussed the assessment and treatment plan with the patient and/or parent/guardian. They were provided an opportunity to ask questions and all were answered. They agreed with the plan and demonstrated an understanding of the instructions.   They were advised to call back or seek an in-person evaluation if the symptoms worsen or if the condition fails to improve as anticipated.  [name], LCSW     03/09/2022    9:36 AM 02/24/2022   10:22 AM  Depression screen PHQ 2/9  Decreased Interest 0 0  Down, Depressed, Hopeless 0 0  PHQ - 2 Score 0 0  Altered sleeping 3 3  Tired, decreased energy 2 3  Change in appetite 0 3  Feeling bad or failure about yourself  0 0  Trouble concentrating 1 1  Moving slowly or fidgety/restless 0 2  Suicidal thoughts 0 0  PHQ-9 Score 6 12      03/09/2022    9:36 AM 02/24/2022   10:23 AM  GAD 7 : Generalized Anxiety Score  Nervous, Anxious, on Edge 0 0  Control/stop worrying 0 0  Worry too much - different things 0 0  Trouble relaxing 0 3  Restless 0 3  Easily annoyed or irritable 0  1  Afraid - awful might happen 0 1  Total GAD 7 Score 0 8

## 2022-04-08 ENCOUNTER — Ambulatory Visit (INDEPENDENT_AMBULATORY_CARE_PROVIDER_SITE_OTHER): Payer: Commercial Managed Care - HMO | Admitting: Psychiatry

## 2022-04-08 VITALS — BP 107/65 | HR 92 | Ht 66.0 in | Wt 238.0 lb

## 2022-04-08 DIAGNOSIS — R51 Headache with orthostatic component, not elsewhere classified: Secondary | ICD-10-CM | POA: Diagnosis not present

## 2022-04-08 DIAGNOSIS — R519 Headache, unspecified: Secondary | ICD-10-CM

## 2022-04-08 NOTE — Progress Notes (Signed)
Referring:  Gavin Pound, CNM [address] [place] [id number]  PCP: Sandrea Hughs, NP  Neurology was asked to evaluate Nancy Garrett, a 31 year old female for a chief complaint of headaches.  Our recommendations of care will be communicated by shared medical record.    CC:  headaches  History provided from self  HPI:  Medical co-morbidities: migraines, s/p gastric bypass  The patient presents for evaluation of headaches which began 16 weeks ago when she became pregnant. Headache is described as constant frontal pressure. Most days headache is a 6/10, but some days headache will become so severe that she will pass out. More severe headaches are associated with photophobia, phonophobia, nausea, and vomiting. Takes Tylenol as needed but has not found this to be helpful. She did have severe headaches with her previous pregnancy as well.  She has also started to develop blurred vision. Is working on scheduling an appointment with her eye doctor. Denies pulsatile tinnitus.  She reportedly was previously told by a neurologist that she had an overproduction of spinal fluid. These records are not available. 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 She never had a spinal tap ,and headaches improved after her gastric sleeve and weight loss.   Headache History: Onset: 16 weeks ago Triggers: heat, sunlight, driving Aura: blurred vision Location: frontal Associated Symptoms:  Photophobia: yes  Phonophobia: yes  Nausea: yes Vomiting: yes Other symptoms: syncope Worse with activity?: yes Duration of headaches: constant  Headache days per month: 30 Headache free days per month: 0  Current Treatment: Abortive Tylenol  Preventative none  Prior Therapies                                 Topamax 100 mg daily Diamox 125 mg TID Tylenol - lack of efficacy Imitrex 50 mg PRN  LABS: CBC    Component Value  Date/Time   WBC 8.9 02/24/2022 1047   WBC 5.9 09/22/2021 1110   RBC 4.63 02/24/2022 1047   RBC 4.44 09/22/2021 1110   HGB 12.9 02/24/2022 1047   HCT 39.2 02/24/2022 1047   PLT 214 02/24/2022 1047   MCV 85 02/24/2022 1047   MCH 27.9 02/24/2022 1047   MCH 28.4 09/22/2021 1110   MCHC 32.9 02/24/2022 1047   MCHC 32.1 09/22/2021 1110   RDW 13.3 02/24/2022 1047   LYMPHSABS 2.5 02/24/2022 1047   MONOABS 0.3 08/05/2020 0945   EOSABS 0.0 02/24/2022 1047   BASOSABS 0.0 02/24/2022 1047      Latest Ref Rng & Units 03/23/2022   11:46 AM 09/22/2021   11:10 AM 08/05/2020    9:45 AM  CMP  Glucose 70 - 99 mg/dL 81  88  99   BUN 6 - 20 mg/dL 7  14  12    Creatinine 0.57 - 1.00 mg/dL 0.60  0.61  0.66   Sodium 134 - 144 mmol/L 136  139  140   Potassium 3.5 - 5.2 mmol/L 4.7  4.1  4.1   Chloride 96 - 106 mmol/L 100  104  104   CO2 20 - 29 mmol/L 22  28  28    Calcium 8.7 - 10.2 mg/dL 9.6  9.7  9.3   Total Protein 6.0 - 8.5 g/dL 6.8  7.1    Total Bilirubin 0.0 - 1.2 mg/dL 0.4  0.8    Alkaline  Phos 44 - 121 IU/L 83     AST 0 - 40 IU/L 10  8    ALT 0 - 32 IU/L 8  5       IMAGING:  None  Current Outpatient Medications on File Prior to Visit  Medication Sig Dispense Refill   aspirin EC 81 MG tablet Take 1 tablet (81 mg total) by mouth daily. Swallow whole. 90 tablet 3   Prenatal Vit-Fe Fumarate-FA (MULTIVITAMIN-PRENATAL) 27-0.8 MG TABS tablet Take 1 tablet by mouth daily at 12 noon.     No current facility-administered medications on file prior to visit.     Allergies: Allergies  Allergen Reactions   Iodinated Contrast Media    Ivp Dye [Iodinated Contrast Media] Nausea And Vomiting    Family History: Family History  Problem Relation Age of Onset   High blood pressure Mother    Diabetes Father    Diabetes Sister    Stroke Sister    High blood pressure Sister      Past Medical History: Past Medical History:  Diagnosis Date   Chronic abdominal pain    Head ache     Past  Surgical History Past Surgical History:  Procedure Laterality Date   GASTRECTOMY  2019   Dr. Darcella Gasman.   LAPAROSCOPIC GASTRIC SLEEVE RESECTION     TONSILLECTOMY      Social History: Social History   Tobacco Use   Smoking status: Never   Smokeless tobacco: Never  Vaping Use   Vaping Use: Never used  Substance Use Topics   Alcohol use: Yes    Alcohol/week: 2.0 standard drinks of alcohol    Types: 2 Glasses of wine per week   Drug use: Never    ROS: Negative for fevers, chills. Positive for headaches. All other systems reviewed and negative unless stated otherwise in HPI.   Physical Exam:   Vital Signs: BP 107/65   Pulse 92   Ht 5\' 6"  (1.676 m)   Wt 238 lb (108 kg)   LMP 12/17/2021   BMI 38.41 kg/m  GENERAL: well appearing,in no acute distress,alert SKIN:  Color, texture, turgor normal. No rashes or lesions HEAD:  Normocephalic/atraumatic. CV:  RRR RESP: Normal respiratory effort MSK: no tenderness to palpation over occiput, neck, or shoulders  NEUROLOGICAL: Mental Status: Alert, oriented to person, place and time,Follows commands Cranial Nerves: no clear papilledema visualized, PERRL, visual fields intact to confrontation, extraocular movements intact, facial sensation intact, no facial droop or ptosis, hearing grossly intact, no dysarthria, palate elevate symmetrically, tongue protrudes midline, shoulder shrug intact and symmetric Motor: muscle strength 5/5 both upper and lower extremities,no drift, normal tone Reflexes: 2+ throughout Sensation: intact to light touch all 4 extremities Coordination: Finger-to- nose-finger intact bilaterally Gait: normal-based   IMPRESSION: 31 year old female with a history of migraines, s/p gastric bypass who is [redacted] weeks pregnant presents for evaluation of headaches. Will order MRI/MRV to assess for structural causes of new daily headaches and syncope in pregnancy. No clear papilledema seen on her exam today, however given  potential history of IIH agree she should have an updated formal eye exam. Discussed supplement options for headache prevention including daily magnesium and vitamin B2. Advised that she can take Benadryl with Tylenol as needed for her more severe headaches. She does not feel she needs an anti-emetic at this time.  PLAN: -MRI, MRV brain -Start magnesium 400 mg daily, vitamin B2 200 mg BID for headache prevention -Can take Benadryl with Tylenol for  headache rescue -Next steps: consider occipital nerve block, cyproheptadine for headache prevention   I spent a total of 46 minutes chart reviewing and counseling the patient. Headache education was done. Discussed treatment options including acute medications and natural supplements. Written educational materials and patient instructions outlining all of the above were given.  Follow-up: 3 months   Ocie Doyne, MD 04/08/2022   1:42 PM

## 2022-04-08 NOTE — Patient Instructions (Addendum)
Supplement options to help prevent headaches: - Magnesium in doses up to 400mg  per day - Vitamin B2 200 mg twice a day  MRI of the brain and MRV of the veins in the brain  MIGRAINE AND PREGNANCY  Migraine is a complex neurological disorder with many triggers, including sex hormones! This means that some women may experience changes in headache frequency when they are pregnant. In general, the increasing levels of estrogen during pregnancy can be protective, and women may see an improvement in migraine in the 2nd and 3rd trimester. After delivery, women typically return to the pre-pregnancy migraine pattern.   Treatment of migraine in pregnancy is tricky because the list of SAFE medications in pregnancy does not necessarily match the list of EFFECTIVE medications in headache.  The most difficult time is when a woman is planning pregnancy and it is unknown whether she is actually pregnant. Unfortunately, to avoid fetal exposure to potentially harmful medications, it is necessary to stop most migraine preventive medications and avoid the use of typical prescribed and over-the-counter as-needed medications previously used for headache.   ACUTE Treatment of Migraine in Pregnancy - Usual abortive treatments such as triptans and NSAIDS are Category C in pregnancy.  - Acetaminophen (Tylenol) up to 500 mg is less commonly used for migraine, however, it is considered the safest analgesic to use in pregnancy (category B), and it can be effective if used early in a migraine, especially in conjunction with caffeine and metoclopramide (reglan), an anti-nausea medication. Antihistamines like Benadryl or Unisom can also be used in combination with these medications.  Supplements for Treatment of Migraine in Pregnancy - Usual prophylactic migraine treatments are not safe to take in pregnancy - Magnesium in doses up to 400mg  per day is often considered first line in pregnancy as a migraine preventative (level B  evidence of efficacy) and is thought to be safe.  - Vitamin B2 200 mg twice a day  Non-pharmacological Methods of Treatment - these are the safest interventions and can significantly redcue the frequency and severity of migraine - Nerve blocks (lidocaine only unless after 20 weeks) - Physical Therapy - Acupuncture - Behavioral Therapies  - Relaxation strategies, stress management, biofeedback, yoga  - Identification of triggers: The major migraine triggers include stress, hormones, diet (missed meals more than specific food triggers), weather, sleep changes, odors, neck pain, lighting/glare, neck pain, exercise/exertion, caffeine intake and dehydration. - Lifestyle changes: eating regular healthy meals, adequate sleep, staying well-hydrated with water (64 oz/day), and at least 30 min of exercise (walking is fine) per day

## 2022-04-10 ENCOUNTER — Ambulatory Visit (HOSPITAL_COMMUNITY): Payer: Commercial Managed Care - HMO | Attending: Cardiology

## 2022-04-10 DIAGNOSIS — I517 Cardiomegaly: Secondary | ICD-10-CM | POA: Diagnosis not present

## 2022-04-10 DIAGNOSIS — I361 Nonrheumatic tricuspid (valve) insufficiency: Secondary | ICD-10-CM

## 2022-04-10 DIAGNOSIS — R0609 Other forms of dyspnea: Secondary | ICD-10-CM | POA: Diagnosis present

## 2022-04-10 LAB — ECHOCARDIOGRAM COMPLETE
Area-P 1/2: 4.29 cm2
S' Lateral: 2.7 cm

## 2022-04-15 ENCOUNTER — Telehealth: Payer: Self-pay | Admitting: Psychiatry

## 2022-04-15 NOTE — Telephone Encounter (Signed)
sent to GI they obtain Novella Rob, [facility] auth: [id number] exp. 04/09/2022-06/08/2022  [phone]

## 2022-04-20 ENCOUNTER — Ambulatory Visit (INDEPENDENT_AMBULATORY_CARE_PROVIDER_SITE_OTHER): Payer: Commercial Managed Care - HMO | Admitting: Clinical

## 2022-04-20 DIAGNOSIS — Z658 Other specified problems related to psychosocial circumstances: Secondary | ICD-10-CM

## 2022-04-20 DIAGNOSIS — F4322 Adjustment disorder with anxiety: Secondary | ICD-10-CM | POA: Diagnosis not present

## 2022-04-21 ENCOUNTER — Ambulatory Visit (INDEPENDENT_AMBULATORY_CARE_PROVIDER_SITE_OTHER): Payer: Commercial Managed Care - HMO | Admitting: Medical

## 2022-04-21 ENCOUNTER — Encounter: Payer: Self-pay | Admitting: Medical

## 2022-04-21 VITALS — BP 100/68 | HR 95 | Wt 239.1 lb

## 2022-04-21 DIAGNOSIS — O99210 Obesity complicating pregnancy, unspecified trimester: Secondary | ICD-10-CM

## 2022-04-21 DIAGNOSIS — O99212 Obesity complicating pregnancy, second trimester: Secondary | ICD-10-CM

## 2022-04-21 DIAGNOSIS — O09292 Supervision of pregnancy with other poor reproductive or obstetric history, second trimester: Secondary | ICD-10-CM

## 2022-04-21 DIAGNOSIS — O09291 Supervision of pregnancy with other poor reproductive or obstetric history, first trimester: Secondary | ICD-10-CM

## 2022-04-21 DIAGNOSIS — O0992 Supervision of high risk pregnancy, unspecified, second trimester: Secondary | ICD-10-CM

## 2022-04-21 DIAGNOSIS — O0990 Supervision of high risk pregnancy, unspecified, unspecified trimester: Secondary | ICD-10-CM

## 2022-04-21 DIAGNOSIS — Z3A17 17 weeks gestation of pregnancy: Secondary | ICD-10-CM

## 2022-04-21 DIAGNOSIS — Z8632 Personal history of gestational diabetes: Secondary | ICD-10-CM

## 2022-04-21 DIAGNOSIS — Z903 Acquired absence of stomach [part of]: Secondary | ICD-10-CM

## 2022-04-21 NOTE — Progress Notes (Signed)
   PRENATAL VISIT NOTE  Subjective:  Nancy Garrett is a 31 y.o. G2P1001 at [redacted]w[redacted]d being seen today for ongoing prenatal care.  She is currently monitored for the following issues for this high-risk pregnancy and has Obesity, Class III, BMI 40-49.9 (morbid obesity) ([employer]); Facet degeneration of lumbar region; Obesity in pregnancy, antepartum; H/O gastric sleeve; History of gestational diabetes; Hx of preeclampsia, prior pregnancy, currently pregnant, first trimester; Supervision of high risk pregnancy, antepartum; and ASCUS of cervix with negative high risk HPV on their problem list.  Patient reports  difficulty sleeping .  Contractions: Not present. Vag. Bleeding: None.  Movement: Present. Denies leaking of fluid.   The following portions of the patient's history were reviewed and updated as appropriate: allergies, current medications, past family history, past medical history, past social history, past surgical history and problem list.   Objective:   Vitals:   04/21/22 0926  BP: 100/68  Pulse: 95  Weight: 239 lb 1.3 oz (108.4 kg)    Fetal Status: Fetal Heart Rate (bpm): 150   Movement: Present     General:  Alert, oriented and cooperative. Patient is in no acute distress.  Skin: Skin is warm and dry. No rash noted.   Cardiovascular: Normal heart rate noted  Respiratory: Normal respiratory effort, no problems with respiration noted  Abdomen: Soft, gravid, appropriate for gestational age.  Pain/Pressure: Present     Pelvic: Cervical exam deferred        Extremities: Normal range of motion.  Edema: Trace  Mental Status: Normal mood and affect. Normal behavior. Normal judgment and thought content.   Assessment and Plan:  Pregnancy: G2P1001 at [redacted]w[redacted]d 1. Supervision of high risk pregnancy, antepartum - Planning to breastfeed - AFP today  - Anatomy US scheduled 05/04/22 - Considering BTL  - Saw cardio-OB, has follow-up scheduled for palpitations, no further syncopal episodes  - still  having frequent HA, caffeine helps, advised of caffeine limits, has MRI scheduled this month  2. History of gestational diabetes  3. Obesity in pregnancy, antepartum  4. Hx of preeclampsia, prior pregnancy, currently pregnant, first trimester - BASA   5. H/O gastric sleeve  6. [redacted] weeks gestation of pregnancy - AFP, Serum, Open Spina Bifida  Preterm labor symptoms and general obstetric precautions including but not limited to vaginal bleeding, contractions, leaking of fluid and fetal movement were reviewed in detail with the patient. Please refer to After Visit Summary for other counseling recommendations.   Return in about 4 weeks (around 05/19/2022) for LOB, In-Person, any provider.  Future Appointments  Date Time Provider [place]  05/02/2022  4:40 PM GI-315 MR 3 GI-315MRI GI-315 [address] [facility]  05/02/2022  5:00 PM GI-315 MR 3 GI-315MRI GI-315 [address] [facility]  05/04/2022 10:15 AM [facility] NURSE [facility]  05/04/2022 10:30 AM [facility] US3 [facility]  05/11/2022 10:15 AM [facility] CLINICIAN [facility]  05/20/2022 10:35 AM Truett Mainland, DO [facility] None  06/05/2022  2:20 PM Tobb, Godfrey Pick, DO [facility] None  07/21/2022  9:00 AM Genia Harold, MD [facility] None  09/28/2022  8:40 AM Ngetich, Nelda Bucks, NP [facility] None    Kerry Hough, PA-C

## 2022-04-21 NOTE — Patient Instructions (Signed)

## 2022-04-23 LAB — AFP, SERUM, OPEN SPINA BIFIDA
AFP MoM: 0.92
AFP Value: 31.8 ng/mL
Gest. Age on Collection Date: 17.6 weeks
Maternal Age At EDD: 32.8 yr
OSBR Risk 1 IN: 10000
Test Results:: NEGATIVE
Weight: 239 [lb_av]

## 2022-04-27 NOTE — BH Specialist Note (Deleted)
Integrated Behavioral Health via Telemedicine Visit  04/27/2022 [name] [id number]  Number of Integrated Behavioral Health Clinician visits: 4- Fourth Visit  Session Start time: 1446   Session End time: 1541  Total time in minutes: 55   Referring Provider: Gerrit Heck, CNM Patient/Family location: Home*** [facility] Provider location: [facility]  All persons participating in visit: [name] and Nancy Garrett ***  Types of Service: {CHL AMB TYPE OF SERVICE:[phone]}  I connected with Nancy Garrett and/or Nancy Garrett's {family members:20773} via  Telephone or Video Enabled Telemedicine Application  (Video is Caregility application) and verified that I am speaking with the correct person using two identifiers. Discussed confidentiality: Yes   I discussed the limitations of telemedicine and the availability of in person appointments.  Discussed there is a possibility of technology failure and discussed alternative modes of communication if that failure occurs.  I discussed that engaging in this telemedicine visit, they consent to the provision of behavioral healthcare and the services will be billed under their insurance.  Patient and/or legal guardian expressed understanding and consented to Telemedicine visit: Yes   Presenting Concerns: Patient and/or family reports the following symptoms/concerns: *** Duration of problem: ***; Severity of problem: {Mild/Moderate/Severe:20260}  Patient and/or Family's Strengths/Protective Factors: {CHL AMB BH PROTECTIVE FACTORS:3076342860}  Goals Addressed: Patient will:  Reduce symptoms of: {IBH Symptoms:21014056}   Increase knowledge and/or ability of: {IBH Patient Tools:21014057}   Demonstrate ability to: {IBH Goals:21014053}  Progress towards Goals: Ongoing  Interventions: Interventions utilized:  {IBH Interventions:21014054} Standardized  Assessments completed: {IBH Screening Tools:21014051}  Patient and/or Family Response: Patient agrees with treatment plan. ***  Assessment: Patient currently experiencing ***.   Patient may benefit from continued therapeutic interventions***.  Plan: Follow up with behavioral health clinician on : *** Behavioral recommendations:  -*** -*** Referral(s): {IBH Referrals:21014055}  I discussed the assessment and treatment plan with the patient and/or parent/guardian. They were provided an opportunity to ask questions and all were answered. They agreed with the plan and demonstrated an understanding of the instructions.   They were advised to call back or seek an in-person evaluation if the symptoms worsen or if the condition fails to improve as anticipated.  Valetta Close Aislinn Feliz, LCSW

## 2022-05-02 ENCOUNTER — Ambulatory Visit
Admission: RE | Admit: 2022-05-02 | Discharge: 2022-05-02 | Disposition: A | Discharge status: Home / Self Care | Payer: Commercial Managed Care - HMO | Source: Ambulatory Visit | Attending: Psychiatry | Admitting: Psychiatry

## 2022-05-02 DIAGNOSIS — R519 Headache, unspecified: Secondary | ICD-10-CM | POA: Diagnosis not present

## 2022-05-02 DIAGNOSIS — R51 Headache with orthostatic component, not elsewhere classified: Secondary | ICD-10-CM

## 2022-05-04 ENCOUNTER — Ambulatory Visit (HOSPITAL_BASED_OUTPATIENT_CLINIC_OR_DEPARTMENT_OTHER): Payer: Commercial Managed Care - HMO | Admitting: Maternal & Fetal Medicine

## 2022-05-04 ENCOUNTER — Encounter: Payer: Self-pay | Admitting: *Deleted

## 2022-05-04 ENCOUNTER — Other Ambulatory Visit: Payer: Self-pay | Admitting: *Deleted

## 2022-05-04 ENCOUNTER — Ambulatory Visit: Payer: Commercial Managed Care - HMO | Admitting: *Deleted

## 2022-05-04 ENCOUNTER — Ambulatory Visit: Payer: Commercial Managed Care - HMO

## 2022-05-04 VITALS — BP 135/71 | HR 89

## 2022-05-04 DIAGNOSIS — O43192 Other malformation of placenta, second trimester: Secondary | ICD-10-CM | POA: Diagnosis not present

## 2022-05-04 DIAGNOSIS — Z3A19 19 weeks gestation of pregnancy: Secondary | ICD-10-CM | POA: Insufficient documentation

## 2022-05-04 DIAGNOSIS — O99412 Diseases of the circulatory system complicating pregnancy, second trimester: Secondary | ICD-10-CM | POA: Diagnosis not present

## 2022-05-04 DIAGNOSIS — O99210 Obesity complicating pregnancy, unspecified trimester: Secondary | ICD-10-CM | POA: Insufficient documentation

## 2022-05-04 DIAGNOSIS — O0990 Supervision of high risk pregnancy, unspecified, unspecified trimester: Secondary | ICD-10-CM | POA: Insufficient documentation

## 2022-05-04 DIAGNOSIS — O99842 Bariatric surgery status complicating pregnancy, second trimester: Secondary | ICD-10-CM | POA: Diagnosis not present

## 2022-05-04 DIAGNOSIS — Z363 Encounter for antenatal screening for malformations: Secondary | ICD-10-CM | POA: Insufficient documentation

## 2022-05-04 DIAGNOSIS — O43122 Velamentous insertion of umbilical cord, second trimester: Secondary | ICD-10-CM | POA: Diagnosis not present

## 2022-05-04 DIAGNOSIS — Z3480 Encounter for supervision of other normal pregnancy, unspecified trimester: Secondary | ICD-10-CM | POA: Insufficient documentation

## 2022-05-04 DIAGNOSIS — O09292 Supervision of pregnancy with other poor reproductive or obstetric history, second trimester: Secondary | ICD-10-CM | POA: Diagnosis not present

## 2022-05-04 DIAGNOSIS — O99212 Obesity complicating pregnancy, second trimester: Secondary | ICD-10-CM | POA: Diagnosis not present

## 2022-05-04 DIAGNOSIS — Z362 Encounter for other antenatal screening follow-up: Secondary | ICD-10-CM

## 2022-05-04 NOTE — Progress Notes (Signed)
MFM Consult Note Patient Name: Nancy Garrett  Patient MRN:   [id number]  Referring provider: [place]  Reason for Consult: marginal cord, obesity, hx of gastric sleeve   HPI: Nancy Garrett is a 31 y.o. G2P1001 at [redacted]w[redacted]d here for ultrasound and consultation.   RE palpitations: Nancy Garrett has been experiencing intermittent palpitations described as abrupt onset of fast heartbeat which last for minutes at a time.  He also has some shortness of breath on exertion.  She follows with cardiology and is worn a Zio patch and echo monitor which was normal.  She has less than a 5% chance of a major cardiovascular event at this pregnancy based on her history.   RE gastric sleeve: Patient had a history of a gastric sleeve in reduced her weight from over 300 pounds to about 180 pounds.  She has gained a significant amount of weight this pregnancy and plans on having either a revision or a Roux-en-Y procedure after pregnancy.  We discussed the pregnancy implications of gastric surgery.  She was able to tolerate an early 1 hour Glucola and plans to repeat it around 28 weeks.  She is aware that she needs to try to limit the amount of weight gain remainder of the pregnancy.   RE history of preeclampsia: She is compliant with aspirin.  Her blood pressure is normal today.  Frequent headaches and follows with neurology.    RE headaches: Headaches and follows with neurology.  She had a brain MRV that was negative on 05/02/2022.  RE marginal cord insertion: This was seen on today's ultrasound and is associated with a slight increased risk of growth abnormalities.  In addition to other comorbidities, serial growth ultrasounds are indicated.  Review of Systems: A review of systems was performed and was negative except per HPI   Vitals and Physical Exam See intake sheet for vitals Sitting comfortably on the sonogram table Nonlabored breathing Normal rate and rhythm Abdomen is nontender  Genetic testing:  normal  Sonographic findings Single intrauterine pregnancy. Observed fetal cardiac activity. Cephalic presentation. Fetal anatomy that was well seen appears normal without evidence of soft markers. Not all fetal structures were well seen due to a technically diffcult exam and the anatomic survey remains incomplete with limited views of the ductal arch.   Fetal biometry shows the estimated fetal weight at the 52 percentile.  Amniotic fluid volume: Within normal limits. Placenta: Posterior. Cervix: Normal appearance by transabdominal scan with a cervical length of 4.4 cm. Adnexa: No adnexal mass visualized.   Assessment -History of preeclampsia -Maternal obesity -Status post gastric sleeve -Marginal cord insertion Plan - F/u growth [place] in 4 weeks - Continue ASA - Repeat GCT around 28w - Continue to f/u with neurology and cardiology as scheduled  I spent 45 minutes reviewing the patients chart, including labs and images as well as counseling the patient about her medical conditions.  Braxton Feathers  MFM, [facility]   05/04/2022  11:42 AM

## 2022-05-05 DIAGNOSIS — R519 Headache, unspecified: Secondary | ICD-10-CM | POA: Insufficient documentation

## 2022-05-11 ENCOUNTER — Encounter: Payer: Medicaid Other | Admitting: Clinical

## 2022-05-13 ENCOUNTER — Inpatient Hospital Stay (HOSPITAL_COMMUNITY)
Admission: AD | Admit: 2022-05-13 | Discharge: 2022-05-13 | Disposition: A | Discharge status: Home / Self Care | Payer: Commercial Managed Care - HMO | Attending: Obstetrics and Gynecology | Admitting: Obstetrics and Gynecology

## 2022-05-13 ENCOUNTER — Inpatient Hospital Stay (HOSPITAL_COMMUNITY): Payer: Commercial Managed Care - HMO

## 2022-05-13 DIAGNOSIS — O99512 Diseases of the respiratory system complicating pregnancy, second trimester: Secondary | ICD-10-CM | POA: Diagnosis not present

## 2022-05-13 DIAGNOSIS — J069 Acute upper respiratory infection, unspecified: Secondary | ICD-10-CM | POA: Diagnosis not present

## 2022-05-13 DIAGNOSIS — J029 Acute pharyngitis, unspecified: Secondary | ICD-10-CM | POA: Diagnosis not present

## 2022-05-13 DIAGNOSIS — Z3A21 21 weeks gestation of pregnancy: Secondary | ICD-10-CM

## 2022-05-13 DIAGNOSIS — Z1152 Encounter for screening for COVID-19: Secondary | ICD-10-CM | POA: Insufficient documentation

## 2022-05-13 DIAGNOSIS — O0992 Supervision of high risk pregnancy, unspecified, second trimester: Secondary | ICD-10-CM | POA: Insufficient documentation

## 2022-05-13 DIAGNOSIS — O99212 Obesity complicating pregnancy, second trimester: Secondary | ICD-10-CM | POA: Insufficient documentation

## 2022-05-13 DIAGNOSIS — R519 Headache, unspecified: Secondary | ICD-10-CM

## 2022-05-13 DIAGNOSIS — R0602 Shortness of breath: Secondary | ICD-10-CM | POA: Diagnosis not present

## 2022-05-13 DIAGNOSIS — O26892 Other specified pregnancy related conditions, second trimester: Secondary | ICD-10-CM | POA: Diagnosis not present

## 2022-05-13 DIAGNOSIS — O0990 Supervision of high risk pregnancy, unspecified, unspecified trimester: Secondary | ICD-10-CM

## 2022-05-13 LAB — CBC
HCT: 34.3 % — ABNORMAL LOW (ref 36.0–46.0)
Hemoglobin: 11 g/dL — ABNORMAL LOW (ref 12.0–15.0)
MCH: 27.4 pg (ref 26.0–34.0)
MCHC: 32.1 g/dL (ref 30.0–36.0)
MCV: 85.5 fL (ref 80.0–100.0)
Platelets: 199 10*3/uL (ref 150–400)
RBC: 4.01 MIL/uL (ref 3.87–5.11)
RDW: 13.2 % (ref 11.5–15.5)
WBC: 10.2 10*3/uL (ref 4.0–10.5)
nRBC: 0 % (ref 0.0–0.2)

## 2022-05-13 LAB — URINALYSIS, ROUTINE W REFLEX MICROSCOPIC
Bilirubin Urine: NEGATIVE
Glucose, UA: NEGATIVE mg/dL
Hgb urine dipstick: NEGATIVE
Ketones, ur: NEGATIVE mg/dL
Nitrite: NEGATIVE
Protein, ur: 30 mg/dL — AB
Specific Gravity, Urine: 1.029 (ref 1.005–1.030)
pH: 5 (ref 5.0–8.0)

## 2022-05-13 LAB — RESP PANEL BY RT-PCR (FLU A&B, COVID) ARPGX2
Influenza A by PCR: NEGATIVE
Influenza B by PCR: NEGATIVE
SARS Coronavirus 2 by RT PCR: NEGATIVE

## 2022-05-13 MED ORDER — LACTATED RINGERS IV BOLUS
1000.0000 mL | Freq: Once | INTRAVENOUS | Status: AC
  Administered 2022-05-13: 1000 mL via INTRAVENOUS

## 2022-05-13 MED ORDER — ACETAMINOPHEN 500 MG PO TABS
1000.0000 mg | ORAL_TABLET | Freq: Once | ORAL | Status: AC
  Administered 2022-05-13: 1000 mg via ORAL
  Filled 2022-05-13: qty 2

## 2022-05-13 MED ORDER — GUAIFENESIN ER 600 MG PO TB12
600.0000 mg | ORAL_TABLET | Freq: Once | ORAL | Status: AC
  Administered 2022-05-13: 600 mg via ORAL
  Filled 2022-05-13: qty 1

## 2022-05-13 NOTE — MAU Provider Note (Addendum)
History     CSN: [id number]  Arrival date and time: 05/13/22 1635   None     Chief Complaint  Patient presents with   Shortness of Breath   Headache   Sore Throat   Cough   HPI  Nancy Garrett is a 31 y.o.[redacted]w[redacted]d G2P1001 with high risk pregnancy and pmh of obesity, Class III, BMI 40-49.9 (morbid obesity) (HCC); H/O gastric sleeve; History of gestational diabetes; Hx of preeclampsia. Presents today with complains of congestion, cough, fatigue some hoarseness (now resolved) which began 6-7 days ago. Has headaches at baseline which she sees neurology for. Has felt active fetal movement, no abdominal pain, spotting, chest pain. No tobacco or cannabis use.   OB History     Gravida  2   Para  1   Term  1   Preterm      AB      Living  1      SAB      IAB      Ectopic      Multiple      Live Births  1           Past Medical History:  Diagnosis Date   Chronic abdominal pain    Head ache     Past Surgical History:  Procedure Laterality Date   GASTRECTOMY  2019   Dr. Darcella Gasman.   LAPAROSCOPIC GASTRIC SLEEVE RESECTION     TONSILLECTOMY      Family History  Problem Relation Age of Onset   High blood pressure Mother    Diabetes Father    Diabetes Sister    Stroke Sister    High blood pressure Sister     Social History   Tobacco Use   Smoking status: Never   Smokeless tobacco: Never  Vaping Use   Vaping Use: Never used  Substance Use Topics   Alcohol use: Yes    Alcohol/week: 2.0 standard drinks of alcohol    Types: 2 Glasses of wine per week   Drug use: Never    Allergies:  Allergies  Allergen Reactions   Iodinated Contrast Media    Ivp Dye [Iodinated Contrast Media] Nausea And Vomiting    Medications Prior to Admission  Medication Sig Dispense Refill Last Dose   aspirin EC 81 MG tablet Take 1 tablet (81 mg total) by mouth daily. Swallow whole. 90 tablet 3 05/12/2022   Prenatal Vit-Fe Fumarate-FA (MULTIVITAMIN-PRENATAL)  27-0.8 MG TABS tablet Take 1 tablet by mouth daily at 12 noon.   05/13/2022    Review of Systems  Constitutional:  Positive for chills.  HENT:  Positive for congestion, sinus pressure and sore throat.   Respiratory:  Positive for shortness of breath.   Cardiovascular: Negative.   Gastrointestinal: Negative.   Genitourinary: Negative.   Neurological:  Positive for headaches.   Physical Exam   Blood pressure 113/84, pulse (!) 120, temperature 98.2 F (36.8 C), temperature source Oral, resp. rate 20, last menstrual period 12/17/2021, SpO2 97 %.  Physical Exam Constitutional:      General: She is not in acute distress.    Appearance: She is well-developed.  HENT:     Mouth/Throat:     Mouth: Mucous membranes are moist.     Pharynx: No pharyngeal swelling.  Cardiovascular:     Rate and Rhythm: Tachycardia present.  Pulmonary:     Effort: Pulmonary effort is normal. No respiratory distress.     Breath sounds: Normal breath sounds. No decreased  breath sounds, wheezing, rhonchi or rales.  Abdominal:     General: Bowel sounds are normal.     Palpations: Abdomen is soft.     Tenderness: There is no abdominal tenderness. There is no guarding or rebound.  Musculoskeletal:     Cervical back: Normal range of motion and neck supple.  Skin:    General: Skin is warm.  Neurological:     General: No focal deficit present.     Mental Status: She is alert and oriented to person, place, and time.  Psychiatric:        Mood and Affect: Mood normal.   Results for orders placed or performed during the hospital encounter of 05/13/22 (from the past 24 hour(s))  Resp Panel by RT-PCR (Flu A&B, Covid) Anterior Nasal Swab     Status: None   Collection Time: 05/13/22  5:03 PM   Specimen: Anterior Nasal Swab  Result Value Ref Range   SARS Coronavirus 2 by RT PCR NEGATIVE NEGATIVE   Influenza A by PCR NEGATIVE NEGATIVE   Influenza B by PCR NEGATIVE NEGATIVE  Urinalysis, Routine w reflex microscopic  Urine, Clean Catch     Status: Abnormal   Collection Time: 05/13/22  5:04 PM  Result Value Ref Range   Color, Urine AMBER (A) YELLOW   APPearance CLOUDY (A) CLEAR   Specific Gravity, Urine 1.029 1.005 - 1.030   pH 5.0 5.0 - 8.0   Glucose, UA NEGATIVE NEGATIVE mg/dL   Hgb urine dipstick NEGATIVE NEGATIVE   Bilirubin Urine NEGATIVE NEGATIVE   Ketones, ur NEGATIVE NEGATIVE mg/dL   Protein, ur 30 (A) NEGATIVE mg/dL   Nitrite NEGATIVE NEGATIVE   Leukocytes,Ua MODERATE (A) NEGATIVE   RBC / HPF 0-5 0 - 5 RBC/hpf   WBC, UA 11-20 0 - 5 WBC/hpf   Bacteria, UA FEW (A) NONE SEEN   Squamous Epithelial / LPF 11-20 0 - 5   Mucus PRESENT   CBC     Status: Abnormal   Collection Time: 05/13/22  5:35 PM  Result Value Ref Range   WBC 10.2 4.0 - 10.5 K/uL   RBC 4.01 3.87 - 5.11 MIL/uL   Hemoglobin 11.0 (L) 12.0 - 15.0 g/dL   HCT 93.8 (L) 18.2 - 99.3 %   MCV 85.5 80.0 - 100.0 fL   MCH 27.4 26.0 - 34.0 pg   MCHC 32.1 30.0 - 36.0 g/dL   RDW 71.6 96.7 - 89.3 %   Platelets 199 150 - 400 K/uL   nRBC 0.0 0.0 - 0.2 %   DG Chest Portable 1 View  Result Date: 05/13/2022 CLINICAL DATA:  Short of breath EXAM: PORTABLE CHEST 1 VIEW COMPARISON:  Chest 08/15/2013 FINDINGS: The heart size and mediastinal contours are within normal limits. Both lungs are clear. The visualized skeletal structures are unremarkable. IMPRESSION: No active disease. Electronically Signed   By: Marlan Palau M.D.   On: 05/13/2022 17:47     MAU Course  Procedures  MDM Nancy Garrett is a 31 y.o.[redacted]w[redacted]d G2P1001 who presented with one week of congestion, cough, headache and fatigue. Patient was tachycardic but physical exam otherwise reassuring. Primary concern for a viral sinusitis given prodrome of symptoms, however with history of palpitations and subjective shortness of breath also worked up for cardiac etiologies and pneumonia. CBC, EKG, CXR, UA, flu and COVID-19 swab unremarkable. Gave LR bolus for possible dehydration. Explained  symptoms are likely due to viral/bacterial sinusitis which seems to already be resolving without any concerning findings. Recommended  discharge home with PO Mucinex, Tylenol. Return precautions if chest pain, increased shortness of breath.   Assessment and Plan  Diagnosis: Sinusitis Prescribed Mucinex and Tylenol   Dispo: Home  Return precautions given  Nancy Garrett 05/13/2022, 5:07 PM    Nancy Garrett attestation:  I have seen and examined this patient and agree with above documentation in the medical student's note.   Nancy Garrett is a 31 y.o. G2P1001 at [redacted]w[redacted]d who presents to MAU for cough, congestion, fatigue, and difficulty breathing. She reports symptoms started on Thanksgiving however today is the worst she has felt. She reports that "it feels like a weight is sitting on my face" and that it is hard to breathe due to congestion. She is also endorsing a headache which is not uncommon for her. She sees neurology and had a recent MRI which was negative. She has tried using a netty potty as well as cough drops and throat spray without any relief. She reports that it feels as if her left nostril is clogged. She denies fever, but reports chills that occurred 2 nights ago.   Patient receives [facility] and next appointment is scheduled on 12/6.   PE: Patient Vitals for the past 24 hrs:  BP Temp Temp src Pulse Resp SpO2  05/13/22 1750 -- -- -- 98 -- 100 %  05/13/22 1745 -- -- -- -- -- 100 %  05/13/22 1740 -- -- -- -- -- 97 %  05/13/22 1709 109/73 -- -- (!) 111 -- --  05/13/22 1645 113/84 98.2 F (36.8 C) Oral (!) 120 20 97 %   Gen: calm, comfortable, NAD Resp: normal effort, breath sounds clear bilaterally, no wheezing, no rales, no distress Heart: regular rate and rhythm Abd: soft, nontender  FHR: 141 bpm via doppler  ROS, labs, PMH reviewed  Orders Placed This Encounter  Procedures   Resp Panel by RT-PCR (Flu A&B, Covid) Anterior Nasal Swab   DG Chest Portable 1 View    Urinalysis, Routine w reflex microscopic Urine, Clean Catch   CBC   Airborne and Contact precautions   ED EKG   Discharge patient   Meds ordered this encounter  Medications   lactated ringers bolus 1,000 mL   guaiFENesin (MUCINEX) 12 hr tablet 600 mg   acetaminophen (TYLENOL) tablet 1,000 mg    MDM UA unremarkable LR bolus initiated as patient was tachycardic on arrival. Flu/Covid swab obtained. CBC also ordered.  I discussed use of Tylenol in pregnancy as patient was concerned about Autism/ADHD in childhood and after further discussion, patient accepts Tylenol as well as Mucinex.  EKG shows sinus tachycardia but otherwise normal. Reviewed EKG with Dr. Camelia Phenes.  Chest x-ray performed and unremarkable as well.  Flu and Covid tests negative. CBC unremarkable.  After IVF bolus, maternal heart rate improved. Reassurance provided that symptoms likely result of URI/viral illness. Low suspicion for acute cardiac/pulmonary event or pneumonia. I reviewed symptom management and relief with patient and her family. At this time patient is stable for discharge home with strict return precautions.    Assessment: 1. Supervision of high risk pregnancy, antepartum   2. Worsening headaches   3. [redacted] weeks gestation of pregnancy   4. Upper respiratory tract infection, unspecified type     Plan: - Discharge home in stable condition - Strict return precautions. Return to MAU as needed for new/worsening symptoms - Keep OB appointment as scheduled on 12/6   Nancy Garrett, Nancy Garrett 05/13/2022 7:41 PM

## 2022-05-13 NOTE — Discharge Instructions (Signed)

## 2022-05-13 NOTE — MAU Note (Addendum)
.  Nancy Garrett is a 31 y.o. at [redacted]w[redacted]d here in MAU reporting: on thanksgiving she started feeling bad. Started with headache, sore throat, cough, congestion,  SOB and then she had chills two nights ago. Has not had any tylenol because she is afraid it will cause autism. Denies VB or LOF. +FM  Pain score: headache, 7 (not new, seeing a neurologist) throat, 4 Vitals:   05/13/22 1645  BP: 113/84  Pulse: (!) 120  Resp: 20  Temp: 98.2 F (36.8 C)  SpO2: 97%     FHT:141 Lab orders placed from triage:  UA

## 2022-05-18 NOTE — BH Specialist Note (Signed)
Integrated Behavioral Health via Telemedicine Visit  05/25/2022 Mumtaz Armetta [id number]  Number of Integrated Behavioral Health Clinician visits: 4- Fourth Visit  Session Start time: [id number]   Session End time: [id number]  Total time in minutes: 55   Referring Provider: Gavin Pound, CNM Patient/Family location: Home [facility] Provider location: Center for [employer] [facility]  All persons participating in visit: [name] and [place]   Types of Service: Individual psychotherapy and Video visit  I connected with [name] and/or Lajean Saver Delaine's  n/a  via  Telephone or Video Enabled Telemedicine Application  (Video is Caregility application) and verified that I am speaking with the correct person using two identifiers. Discussed confidentiality: Yes   I discussed the limitations of telemedicine and the availability of in person appointments.  Discussed there is a possibility of technology failure and discussed alternative modes of communication if that failure occurs.  I discussed that engaging in this telemedicine visit, they consent to the provision of behavioral healthcare and the services will be billed under their insurance.  Patient and/or legal guardian expressed understanding and consented to Telemedicine visit: Yes   Presenting Concerns: Patient and/or family reports the following symptoms/concerns: Ongoing conflict with extended family with expected increase over holidays. Duration of problem: Ongoing with increase current pregnancy; Severity of problem: moderate  Patient and/or Family's Strengths/Protective Factors: Social connections, Concrete supports in place (healthy food, safe environments, etc.), Sense of purpose, and Physical Health (exercise, healthy diet, medication compliance, etc.)  Goals Addressed: Patient will:  Reduce symptoms of: anxiety and stress    Demonstrate ability to: Increase healthy  adjustment to current life circumstances  Progress towards Goals: Ongoing  Interventions: Interventions utilized:  Supportive Reflection Standardized Assessments completed: Not Needed  Patient and/or Family Response: Patient agrees with treatment plan.   Assessment: Patient currently experiencing Adjustment disorder with anxious mood and Psychosocial stress.   Patient may benefit from continued therapeutic interventions.  Plan: Follow up with behavioral health clinician on : Three weeks Behavioral recommendations:  -Continue taking prenatal vitamin daily -Continue setting healthy boundaries with extended family -Continue prioritizing healthy self-care (regular meals, adequate rest) daily -Continue plan to cook and freeze food ahead for holidays, to minimize last-minute stress, beginning this week Referral(s): [facility] (In Clinic)  I discussed the assessment and treatment plan with the patient and/or parent/guardian. They were provided an opportunity to ask questions and all were answered. They agreed with the plan and demonstrated an understanding of the instructions.   They were advised to call back or seek an in-person evaluation if the symptoms worsen or if the condition fails to improve as anticipated.  [name], LCSW     03/09/2022    9:36 AM 02/24/2022   10:22 AM  Depression screen PHQ 2/9  Decreased Interest 0 0  Down, Depressed, Hopeless 0 0  PHQ - 2 Score 0 0  Altered sleeping 3 3  Tired, decreased energy 2 3  Change in appetite 0 3  Feeling bad or failure about yourself  0 0  Trouble concentrating 1 1  Moving slowly or fidgety/restless 0 2  Suicidal thoughts 0 0  PHQ-9 Score 6 12      03/09/2022    9:36 AM 02/24/2022   10:23 AM  GAD 7 : Generalized Anxiety Score  Nervous, Anxious, on Edge 0 0  Control/stop worrying 0 0  Worry too much - different things 0 0  Trouble relaxing 0  3  Restless 0 3  Easily annoyed or  irritable 0 1  Afraid - awful might happen 0 1  Total GAD 7 Score 0 8

## 2022-05-20 ENCOUNTER — Ambulatory Visit (INDEPENDENT_AMBULATORY_CARE_PROVIDER_SITE_OTHER): Payer: Commercial Managed Care - HMO | Admitting: Family Medicine

## 2022-05-20 VITALS — BP 106/67 | HR 95 | Wt 244.0 lb

## 2022-05-20 DIAGNOSIS — O43199 Other malformation of placenta, unspecified trimester: Secondary | ICD-10-CM | POA: Insufficient documentation

## 2022-05-20 DIAGNOSIS — O43192 Other malformation of placenta, second trimester: Secondary | ICD-10-CM

## 2022-05-20 DIAGNOSIS — O09292 Supervision of pregnancy with other poor reproductive or obstetric history, second trimester: Secondary | ICD-10-CM

## 2022-05-20 DIAGNOSIS — Z903 Acquired absence of stomach [part of]: Secondary | ICD-10-CM

## 2022-05-20 DIAGNOSIS — O0992 Supervision of high risk pregnancy, unspecified, second trimester: Secondary | ICD-10-CM

## 2022-05-20 DIAGNOSIS — Z8632 Personal history of gestational diabetes: Secondary | ICD-10-CM

## 2022-05-20 DIAGNOSIS — O09291 Supervision of pregnancy with other poor reproductive or obstetric history, first trimester: Secondary | ICD-10-CM

## 2022-05-20 DIAGNOSIS — R87610 Atypical squamous cells of undetermined significance on cytologic smear of cervix (ASC-US): Secondary | ICD-10-CM

## 2022-05-20 DIAGNOSIS — O0990 Supervision of high risk pregnancy, unspecified, unspecified trimester: Secondary | ICD-10-CM

## 2022-05-20 NOTE — Progress Notes (Signed)
   PRENATAL VISIT NOTE  Subjective:  Nancy Garrett is a 31 y.o. G2P1001 at [redacted]w[redacted]d being seen today for ongoing prenatal care.  She is currently monitored for the following issues for this high-risk pregnancy and has Obesity, Class III, BMI 40-49.9 (morbid obesity) (HCC); Facet degeneration of lumbar region; Obesity in pregnancy, antepartum; H/O gastric sleeve; History of gestational diabetes; Hx of preeclampsia, prior pregnancy, currently pregnant, first trimester; Supervision of high risk pregnancy, antepartum; ASCUS of cervix with negative high risk HPV; and Worsening headaches on their problem list.  Patient reports  Having a lot of stress due to family members. Has father-in-law in the hospital, a cousin who was recently shot, strained relationships with other family members.  Has a therapist.  Contractions: Not present. Vag. Bleeding: None.  Movement: Present. Denies leaking of fluid.   The following portions of the patient's history were reviewed and updated as appropriate: allergies, current medications, past family history, past medical history, past social history, past surgical history and problem list.   Objective:   Vitals:   05/20/22 1026  BP: 106/67  Pulse: 95  Weight: 244 lb (110.7 kg)    Fetal Status: Fetal Heart Rate (bpm): 140   Movement: Present     General:  Alert, oriented and cooperative. Patient is in no acute distress.  Skin: Skin is warm and dry. No rash noted.   Cardiovascular: Normal heart rate noted  Respiratory: Normal respiratory effort, no problems with respiration noted  Abdomen: Soft, gravid, appropriate for gestational age.  Pain/Pressure: Absent     Pelvic: Cervical exam deferred        Extremities: Normal range of motion.  Edema: None  Mental Status: Normal mood and affect. Normal behavior. Normal judgment and thought content.   Assessment and Plan:  Pregnancy: G2P1001 at [redacted]w[redacted]d 1. Supervision of high risk pregnancy, antepartum FHT and FH  normal Discussed about [employer]Mom study - she is interested. Will refer.  2. Hx of preeclampsia, prior pregnancy, currently pregnant, first trimester BP normal On ASA 81mg   3. H/O gastric sleeve 10 pound weight gain  4. History of gestational diabetes Early screening neg  5. ASCUS of cervix with negative high risk HPV Routine screening  6. Marginal insertion of umbilical cord affecting management of mother Follow growth .    Preterm labor symptoms and general obstetric precautions including but not limited to vaginal bleeding, contractions, leaking of fluid and fetal movement were reviewed in detail with the patient. Please refer to After Visit Summary for other counseling recommendations.   No follow-ups on file.  Future Appointments  Date Time Provider Department Center  05/25/2022  1:15 PM [facility] HEALTH CLINICIAN [facility]  06/05/2022  2:20 PM Tobb, Kardie, DO [facility] None  06/11/2022 10:45 AM [facility] NURSE [facility]  06/11/2022 11:00 AM [facility] US1 [facility]  06/17/2022  8:35 AM Anyanwu, 08/16/2022, MD [facility] None  07/01/2022 10:55 AM 07/03/2022, DO [facility] None  07/09/2022 10:45 AM [facility] NURSE [facility]  07/09/2022 11:00 AM [facility] US1 [facility]  07/15/2022 10:55 AM 07/17/2022, DO [facility] None  07/21/2022  9:00 AM 09/19/2022, MD [facility] None  09/28/2022  8:40 AM Ngetich, 09/30/2022, NP [facility] None    Donalee Citrin, DO

## 2022-05-20 NOTE — Addendum Note (Signed)
Addended by: Levie Heritage on: 05/20/2022 01:17 PM   Modules accepted: Orders

## 2022-05-25 ENCOUNTER — Ambulatory Visit (INDEPENDENT_AMBULATORY_CARE_PROVIDER_SITE_OTHER): Payer: Medicaid Other | Admitting: Clinical

## 2022-05-25 DIAGNOSIS — Z658 Other specified problems related to psychosocial circumstances: Secondary | ICD-10-CM

## 2022-05-25 DIAGNOSIS — F4322 Adjustment disorder with anxiety: Secondary | ICD-10-CM

## 2022-06-02 NOTE — BH Specialist Note (Signed)
Integrated Behavioral Health via Telemedicine Visit  06/16/2022 Nancy Garrett [id number]  Number of Integrated Behavioral Health Clinician visits: 6-Sixth Visit  Session Start time: 5176   Session End time: 1006  Total time in minutes: 18   Referring Provider: Gerrit Heck, CNM Patient/Family location: Home [facility] Provider location: [facility]  All persons participating in visit: [name] and [facility] Nancy Garrett   Types of Service: Individual psychotherapy and Video visit  I connected with [name] and/or [name]'s  daughter  via  Telephone or Engineer, civil (consulting)  (Video is Caregility application) and verified that I am speaking with the correct person using two identifiers. Discussed confidentiality: Yes   I discussed the limitations of telemedicine and the availability of in person appointments.  Discussed there is a possibility of technology failure and discussed alternative modes of communication if that failure occurs.  I discussed that engaging in this telemedicine visit, they consent to the provision of behavioral healthcare and the services will be billed under their insurance.  Patient and/or legal guardian expressed understanding and consented to Telemedicine visit: Yes   Presenting Concerns: Patient and/or family reports the following symptoms/concerns: Increasing anxiety with panic Duration of problem: Increase current pregnancy; Severity of problem: moderate  Patient and/or Family's Strengths/Protective Factors: Social connections, Concrete supports in place (healthy food, safe environments, etc.), Sense of purpose, and Physical Health (exercise, healthy diet, medication compliance, etc.)  Goals Addressed: Patient will:  Reduce symptoms of: anxiety and stress    Demonstrate ability to: Increase healthy adjustment to current life circumstances  Progress  towards Goals: Ongoing  Interventions: Interventions utilized:  Supportive Reflection Standardized Assessments completed: GAD-7  Patient and/or Family Response: Patient agrees with treatment plan.   Assessment: Patient currently experiencing Adjustment disorder with anxious mood and Psychosocial stress.   Patient may benefit from continued therapeutic interventions.  Plan: Follow up with behavioral health clinician on : Two weeks Behavioral recommendations:  -Continue taking all medications as prescribed -Continue setting healthy boundaries in all relationships and prioritizing healthy self-care daily Referral(s): Integrated [employer] (In Clinic)  I discussed the assessment and treatment plan with the patient and/or parent/guardian. They were provided an opportunity to ask questions and all were answered. They agreed with the plan and demonstrated an understanding of the instructions.   They were advised to call back or seek an in-person evaluation if the symptoms worsen or if the condition fails to improve as anticipated.  [name], LCSW     03/09/2022    9:36 AM 02/24/2022   10:22 AM  Depression screen PHQ 2/9  Decreased Interest 0 0  Down, Depressed, Hopeless 0 0  PHQ - 2 Score 0 0  Altered sleeping 3 3  Tired, decreased energy 2 3  Change in appetite 0 3  Feeling bad or failure about yourself  0 0  Trouble concentrating 1 1  Moving slowly or fidgety/restless 0 2  Suicidal thoughts 0 0  PHQ-9 Score 6 12      06/16/2022    9:56 AM 03/09/2022    9:36 AM 02/24/2022   10:23 AM  GAD 7 : Generalized Anxiety Score  Nervous, Anxious, on Edge 1 0 0  Control/stop worrying 2 0 0  Worry too much - different things 0 0 0  Trouble relaxing 1 0 3  Restless 0 0 3  Easily annoyed or irritable 1 0 1  Afraid - awful might happen  1 0 1  Total GAD 7 Score 6 0 8

## 2022-06-05 ENCOUNTER — Ambulatory Visit (INDEPENDENT_AMBULATORY_CARE_PROVIDER_SITE_OTHER): Payer: Commercial Managed Care - HMO | Admitting: Cardiology

## 2022-06-05 ENCOUNTER — Encounter: Payer: Self-pay | Admitting: Cardiology

## 2022-06-05 VITALS — BP 102/68 | HR 97 | Ht 66.0 in | Wt 245.0 lb

## 2022-06-05 DIAGNOSIS — R55 Syncope and collapse: Secondary | ICD-10-CM | POA: Diagnosis not present

## 2022-06-05 MED ORDER — PROPRANOLOL HCL 10 MG PO TABS: 10.0000 mg | ORAL_TABLET | Freq: Every day | ORAL | 1 refills | Status: DC

## 2022-06-05 NOTE — Progress Notes (Unsigned)
Cardio-Obstetrics Clinic  New Evaluation  Date:  06/05/2022   ID:  Nancy Garrett, DOB [birth year], MRN [id number]  PCP:  Caesar Bookman, NP   [facility] Providers Cardiologist:  Thomasene Ripple, DO  Electrophysiologist:  None       Referring MD: Caesar Bookman, NP   Chief Complaint: " I am having palpitations"  History of Present Illness:    Nancy Garrett is a 31 y.o. female [G2P1001] who is being seen today for the evaluation of shortness of breath and palpitations at the request of Ngetich, Dinah C, NP.   Medical history includes history of pre-eclampsia in her first pregnancy which was 6 years ago, gestational diabetes and headaches. Here today with her husband and daughter to be evaluated for dyspnea on exertion and palpitations.  At her last visit she was experiencing intermittent palpitations- with associated syncope or presyncope. Placed a zio monitor on the patient and scheduled her for an echo.   Since her visit she has had palpitations leading to 2 syncope episodes.   Prior CV Studies Reviewed: The following studies were reviewed today:  Zio monitor 04/15/2022 Patch Wear Time:  13 days and 8 hours (2023-10-06T16:15:18-0400 to 2023-10-20T00:16:59-0400)   Patient had a min HR of 59 bpm, max HR of 168 bpm, and avg HR of 88 bpm. Predominant underlying rhythm was Sinus Rhythm. Isolated SVEs were rare (<1.0%), and no SVE Couplets or SVE Triplets were present. Isolated VEs were rare (<1.0%), VE Couplets were  rare (<1.0%), and no VE Triplets were present.    Symptoms associated with sinus tachycardia.   Conclusion: Normal/Unremarkable study.  TTE 2023 IMPRESSIONS   1. Left ventricular ejection fraction, by estimation, is 60 to 65%. The left ventricle has normal function. The left ventricle has no regional wall motion abnormalities. Left ventricular diastolic parameters were normal.   2. Right ventricular systolic function is normal. The right ventricular  size is normal. There is normal pulmonary artery systolic pressure.   3. Left atrial size was mildly dilated.   4. The mitral valve is abnormal. Trivial mitral valve regurgitation. No evidence of mitral stenosis.   5. The aortic valve is tricuspid. Aortic valve regurgitation is not visualized. No aortic stenosis is present.   6. The inferior vena cava is normal in size with greater than 50% respiratory variability, suggesting right atrial pressure of 3 mmHg.   FINDINGS   Left Ventricle: Left ventricular ejection fraction, by estimation, is 60 to 65%. The left ventricle has normal function. The left ventricle has no regional wall motion abnormalities. The left ventricular internal cavity size was normal in size. There is  no left ventricular hypertrophy. Left ventricular diastolic parameters were normal.   Right Ventricle: The right ventricular size is normal. No increase in right ventricular wall thickness. Right ventricular systolic function is normal. There is normal pulmonary artery systolic pressure. The tricuspid regurgitant velocity is 2.12 m/s, and  with an assumed right atrial pressure of 3 mmHg, the estimated right ventricular systolic pressure is 21.0 mmHg.   Left Atrium: Left atrial size was mildly dilated.   Right Atrium: Right atrial size was normal in size.   Pericardium: There is no evidence of pericardial effusion.   Mitral Valve: The mitral valve is abnormal. There is mild thickening of the mitral valve leaflet(s). Trivial mitral valve regurgitation. No evidence of mitral valve stenosis.   Tricuspid Valve: The tricuspid valve is normal in structure. Tricuspid valve regurgitation is mild . No evidence of tricuspid  stenosis.   Aortic Valve: The aortic valve is tricuspid. Aortic valve regurgitation is not visualized. No aortic stenosis is present.   Pulmonic Valve: The pulmonic valve was normal in structure. Pulmonic valve regurgitation is not visualized. No evidence of pulmonic  stenosis.   Aorta: The aortic root is normal in size and structure.   Venous: The inferior vena cava is normal in size with greater than 50% respiratory variability, suggesting right atrial pressure of 3 mmHg.   IAS/Shunts: No atrial level shunt detected by color flow Doppler.   Past Medical History:  Diagnosis Date   Chronic abdominal pain    Head ache     Past Surgical History:  Procedure Laterality Date   GASTRECTOMY  2019   Dr. Darcella Gasman.   LAPAROSCOPIC GASTRIC SLEEVE RESECTION     TONSILLECTOMY        OB History     Gravida  2   Para  1   Term  1   Preterm      AB      Living  1      SAB      IAB      Ectopic      Multiple      Live Births  1               Current Medications: Current Meds  Medication Sig   aspirin EC 81 MG tablet Take 1 tablet (81 mg total) by mouth daily. Swallow whole.   Prenatal Vit-Fe Fumarate-FA (MULTIVITAMIN-PRENATAL) 27-0.8 MG TABS tablet Take 1 tablet by mouth daily at 12 noon.     Allergies:   Iodinated contrast media and Ivp dye [iodinated contrast media]   Social History   Socioeconomic History   Marital status: Married    Spouse name: Not on file   Number of children: Not on file   Years of education: Not on file   Highest education level: Not on file  Occupational History   Not on file  Tobacco Use   Smoking status: Never   Smokeless tobacco: Never  Vaping Use   Vaping Use: Never used  Substance and Sexual Activity   Alcohol use: Yes    Alcohol/week: 2.0 standard drinks of alcohol    Types: 2 Glasses of wine per week   Drug use: Never   Sexual activity: Yes    Birth control/protection: I.U.D.  Other Topics Concern   Not on file  Social History Narrative   ** Merged History Encounter **       Tobacco use, amount per day now: Past tobacco use, amount per day: How many years did you use tobacco: Alcohol use (drinks per week): Diet: Do you drink/eat things with caffeine: No Marital  status:   Married                               What year    were you married? 2022 Do you live in a house, apartment, assisted living, condo, trailer, etc.? House Is it one or more stories? One How many persons live in your home? 3  Do you have pets in your home?( please list) No Highest Level of education c   ompleted? College Current or past profession: Associate Professor, Scientist, research (physical sciences). Do you exercise? No  Type and how often? Do you have a living will? Yes Do you have a DNR form?   No                               If not, do you    want to discuss one? Do you have signed POA/HPOA forms?    No                    If so, please bring to you appointment  Do you have any difficulty bathing or dressing yourself? Yes Do you have any difficulty preparing food or eating? No Do you hav   e any difficulty managing your medications? No Do you have any difficulty managing your finances? No Do you have any difficulty affording your medications? No   Social Determinants of Health   Financial Resource Strain: Not on file  Food Insecurity: Food Insecurity Present (03/09/2022)   Hunger Vital Sign    Worried About Running Out of Food in the Last Year: Never true    Ran Out of Food in the Last Year: Sometimes true  Transportation Needs: No Transportation Needs (03/09/2022)   PRAPARE - Administrator, Civil Service (Medical): No    Lack of Transportation (Non-Medical): No  Physical Activity: Not on file  Stress: Not on file  Social Connections: Not on file      Family History  Problem Relation Age of Onset   High blood pressure Mother    Diabetes Father    Diabetes Sister    Stroke Sister    High blood pressure Sister       ROS:   Please see the history of present illness.    Reports palpiations and shortness of breath All other systems reviewed and are negative.   Labs/EKG Reviewed:    EKG:   EKG is was ordered today.  The ekg ordered today  demonstrates NSR HR   Recent Labs: 03/23/2022: ALT 8; BUN 7; Creatinine, Ser 0.60; Potassium 4.7; Sodium 136; TSH 1.010 05/13/2022: Hemoglobin 11.0; Platelets 199   Recent Lipid Panel Lab Results  Component Value Date/Time   CHOL 255 (H) 09/22/2021 11:10 AM   TRIG 49 09/22/2021 11:10 AM   HDL 70 09/22/2021 11:10 AM   CHOLHDL 3.6 09/22/2021 11:10 AM   LDLCALC 170 (H) 09/22/2021 11:10 AM    Physical Exam:    VS:  BP 102/68   Pulse 97   Ht 5\' 6"  (1.676 m)   Wt 245 lb (111.1 kg)   LMP 12/17/2021   SpO2 99%   BMI 39.54 kg/m     Wt Readings from Last 3 Encounters:  06/05/22 245 lb (111.1 kg)  05/20/22 244 lb (110.7 kg)  04/21/22 239 lb 1.3 oz (108.4 kg)     GEN:  Well nourished, well developed in no acute distress HEENT: Normal NECK: No JVD; No carotid bruits LYMPHATICS: No lymphadenopathy CARDIAC: RRR, no murmurs, rubs, gallops RESPIRATORY:  Clear to auscultation without rales, wheezing or rhonchi  ABDOMEN: Soft, non-tender, non-distended MUSCULOSKELETAL:  No edema; No deformity  SKIN: Warm and dry NEUROLOGIC:  Alert and oriented x 3 PSYCHIATRIC:  Normal affect    Risk Assessment/Risk Calculators:            { Click for CHADS2VASc Score - THEN Refresh Note    :13/07/23      ASSESSMENT & PLAN:     Her results  did not show any arrhythmias- but she is still experiencing palpitations that have led to syncope twice since her last visit.  Will do a trial of propanolol 10mg  at night time FU in 6 weeks  There are no Patient Instructions on file for this visit.   Dispo:  No follow-ups on file.   Medication Adjustments/Labs and Tests Ordered: Current medicines are reviewed at length with the patient today.  Concerns regarding medicines are outlined above.  Tests Ordered: No orders of the defined types were placed in this encounter.  Medication Changes: No orders of the defined types were placed in this encounter.

## 2022-06-05 NOTE — Patient Instructions (Signed)
Medication Instructions:  Your physician has recommended you make the following change in your medication:  START: Propanolol 10mg  daily at bedtime  *If you need a refill on your cardiac medications before your next appointment, please call your pharmacy*   Lab Work: NONE If you have labs (blood work) drawn today and your tests are completely normal, you will receive your results only by: MyChart Message (if you have MyChart) OR A paper copy in the mail If you have any lab test that is abnormal or we need to change your treatment, we will call you to review the results.   Testing/Procedures: NONE   Follow-Up: At [facility], you and your health needs are our priority.  As part of our continuing mission to provide you with exceptional heart care, we have created designated Provider Care Teams.  These Care Teams include your primary Cardiologist (physician) and Advanced Practice Providers (APPs -  Physician Assistants and Nurse Practitioners) who all work together to provide you with the care you need, when you need it.  We recommend signing up for the patient portal called "MyChart".  Sign up information is provided on this After Visit Summary.  MyChart is used to connect with patients for Virtual Visits (Telemedicine).  Patients are able to view lab/test results, encounter notes, upcoming appointments, etc.  Non-urgent messages can be sent to your provider as well.   To learn more about what you can do with MyChart, go to [facility].    Your next appointment:   6 week(s)  The format for your next appointment:   In Person  Provider:   [email or web address]  [facility] [address] [place], [place]   OR  [phone] [address] [place]

## 2022-06-11 ENCOUNTER — Ambulatory Visit: Payer: Commercial Managed Care - HMO

## 2022-06-12 ENCOUNTER — Encounter: Payer: Self-pay | Admitting: *Deleted

## 2022-06-12 ENCOUNTER — Ambulatory Visit (HOSPITAL_BASED_OUTPATIENT_CLINIC_OR_DEPARTMENT_OTHER): Payer: Commercial Managed Care - HMO

## 2022-06-12 ENCOUNTER — Ambulatory Visit: Payer: Commercial Managed Care - HMO | Attending: Maternal & Fetal Medicine | Admitting: *Deleted

## 2022-06-12 VITALS — BP 110/63 | HR 85

## 2022-06-12 DIAGNOSIS — O99212 Obesity complicating pregnancy, second trimester: Secondary | ICD-10-CM | POA: Insufficient documentation

## 2022-06-12 DIAGNOSIS — Z362 Encounter for other antenatal screening follow-up: Secondary | ICD-10-CM

## 2022-06-12 DIAGNOSIS — O43192 Other malformation of placenta, second trimester: Secondary | ICD-10-CM

## 2022-06-12 DIAGNOSIS — O99842 Bariatric surgery status complicating pregnancy, second trimester: Secondary | ICD-10-CM | POA: Diagnosis not present

## 2022-06-12 DIAGNOSIS — O321XX0 Maternal care for breech presentation, not applicable or unspecified: Secondary | ICD-10-CM | POA: Diagnosis not present

## 2022-06-12 DIAGNOSIS — O99412 Diseases of the circulatory system complicating pregnancy, second trimester: Secondary | ICD-10-CM

## 2022-06-12 DIAGNOSIS — Z3A25 25 weeks gestation of pregnancy: Secondary | ICD-10-CM | POA: Insufficient documentation

## 2022-06-12 DIAGNOSIS — Z8759 Personal history of other complications of pregnancy, childbirth and the puerperium: Secondary | ICD-10-CM | POA: Insufficient documentation

## 2022-06-12 DIAGNOSIS — O0990 Supervision of high risk pregnancy, unspecified, unspecified trimester: Secondary | ICD-10-CM

## 2022-06-12 DIAGNOSIS — Z8632 Personal history of gestational diabetes: Secondary | ICD-10-CM

## 2022-06-12 DIAGNOSIS — R002 Palpitations: Secondary | ICD-10-CM

## 2022-06-12 DIAGNOSIS — O09292 Supervision of pregnancy with other poor reproductive or obstetric history, second trimester: Secondary | ICD-10-CM

## 2022-06-12 DIAGNOSIS — R519 Headache, unspecified: Secondary | ICD-10-CM

## 2022-06-12 DIAGNOSIS — E669 Obesity, unspecified: Secondary | ICD-10-CM

## 2022-06-15 NOTE — L&D Delivery Note (Addendum)
LABOR COURSE Elective IOL with excellent progress to complete with pitocin and SROM. Mother, FOB and doula present for birth.   Delivery Note Called to room and patient was complete and pushing. Head delivered OA to ROA. Tight nuchal cord present, somersault maneuver performed for birth of baby. Shoulder and body delivered in usual fashion. At 1527 a NVSB of viable female.  Infant with spontaneous cry, placed on mother's abdomen, dried and stimulated. Cord clamped x 2 after 5-minute delay, and cut by Jamel (FOB). Cord blood drawn. Placenta delivered spontaneously with gentle cord traction. Appears intact. Fundus firm with massage and Pitocin. Labia, perineum, vagina, and cervix inspected with sterile gauze to reveal small abrasion to perineum, hemostatic, repair not indicated.    APGAR:9 ,9 ; weight  pending.   Cord: 3VC with the following complications:nuchal cord, unable to reduce, somersault maneuver performed.   Cord pH: not indicated  Anesthesia:  epidural Episiotomy: None Lacerations: abrasion, repair not indicated Est. Blood Loss (mL): 89  Mom to postpartum.  Baby girl "Nancy Garrett" to Couplet care / Skin to Skin.     Nancy Garrett, SNM 09/18/22 3:44 PM      I was gloved and present for entire delivery SVD without incident No difficulty with shoulders Lacerations as listed above Weight 3360g   Clayton Bibles, CNM 09/18/22 5:24 PM

## 2022-06-16 ENCOUNTER — Ambulatory Visit (INDEPENDENT_AMBULATORY_CARE_PROVIDER_SITE_OTHER): Payer: Commercial Managed Care - HMO | Admitting: Clinical

## 2022-06-16 DIAGNOSIS — F4322 Adjustment disorder with anxiety: Secondary | ICD-10-CM | POA: Diagnosis not present

## 2022-06-16 DIAGNOSIS — Z658 Other specified problems related to psychosocial circumstances: Secondary | ICD-10-CM

## 2022-06-17 ENCOUNTER — Ambulatory Visit (INDEPENDENT_AMBULATORY_CARE_PROVIDER_SITE_OTHER): Payer: Commercial Managed Care - HMO | Admitting: Obstetrics & Gynecology

## 2022-06-17 ENCOUNTER — Encounter: Payer: Self-pay | Admitting: General Practice

## 2022-06-17 ENCOUNTER — Encounter: Payer: Self-pay | Admitting: Obstetrics & Gynecology

## 2022-06-17 VITALS — BP 105/66 | HR 88 | Wt 246.0 lb

## 2022-06-17 DIAGNOSIS — O43192 Other malformation of placenta, second trimester: Secondary | ICD-10-CM

## 2022-06-17 DIAGNOSIS — Z3A26 26 weeks gestation of pregnancy: Secondary | ICD-10-CM | POA: Diagnosis not present

## 2022-06-17 DIAGNOSIS — Z8632 Personal history of gestational diabetes: Secondary | ICD-10-CM

## 2022-06-17 DIAGNOSIS — Z1332 Encounter for screening for maternal depression: Secondary | ICD-10-CM | POA: Diagnosis not present

## 2022-06-17 DIAGNOSIS — O99012 Anemia complicating pregnancy, second trimester: Secondary | ICD-10-CM

## 2022-06-17 DIAGNOSIS — O0990 Supervision of high risk pregnancy, unspecified, unspecified trimester: Secondary | ICD-10-CM

## 2022-06-17 DIAGNOSIS — R55 Syncope and collapse: Secondary | ICD-10-CM

## 2022-06-17 DIAGNOSIS — O09299 Supervision of pregnancy with other poor reproductive or obstetric history, unspecified trimester: Secondary | ICD-10-CM

## 2022-06-17 DIAGNOSIS — O99210 Obesity complicating pregnancy, unspecified trimester: Secondary | ICD-10-CM

## 2022-06-17 DIAGNOSIS — O09292 Supervision of pregnancy with other poor reproductive or obstetric history, second trimester: Secondary | ICD-10-CM

## 2022-06-17 DIAGNOSIS — D509 Iron deficiency anemia, unspecified: Secondary | ICD-10-CM

## 2022-06-17 DIAGNOSIS — O0992 Supervision of high risk pregnancy, unspecified, second trimester: Secondary | ICD-10-CM | POA: Diagnosis not present

## 2022-06-17 DIAGNOSIS — O43199 Other malformation of placenta, unspecified trimester: Secondary | ICD-10-CM

## 2022-06-17 DIAGNOSIS — O99212 Obesity complicating pregnancy, second trimester: Secondary | ICD-10-CM

## 2022-06-17 NOTE — Patient Instructions (Signed)
Return to office for any scheduled appointments. Call the office or go to the MAU at [facility] if: You begin to have strong, frequent contractions Your water breaks.  Sometimes it is a big gush of fluid, sometimes it is just a trickle that keeps getting your underwear wet or running down your legs You have vaginal bleeding.  It is normal to have a small amount of spotting if your cervix was checked.  You do not feel your baby moving like normal.  If you do not, get something to eat and drink and lay down and focus on feeling your baby move.   If your baby is still not moving like normal, you should call the office or go to MAU. Any other obstetric concerns.  

## 2022-06-17 NOTE — Progress Notes (Signed)
   PRENATAL VISIT NOTE  Subjective:  Nancy Garrett is a 32 y.o. G2P1001 at [redacted]w[redacted]d being seen today for ongoing prenatal care.  She is currently monitored for the following issues for this high-risk pregnancy and has Facet degeneration of lumbar region; Obesity in pregnancy, antepartum; H/O gastric sleeve; History of gestational diabetes; Hx of preeclampsia, prior pregnancy, currently pregnant; Supervision of high risk pregnancy, antepartum; ASCUS of cervix with negative high risk HPV; Worsening headaches; and Marginal insertion of umbilical cord affecting management of mother on their problem list.  Patient reports  syncopal episodes on 06/08/22.  Had negative cardiology evaluation and is on Propanolol but she is still having episodes. No cardiac symptoms.  Contractions: Not present. Vag. Bleeding: None.  Movement: Present. Denies leaking of fluid.   The following portions of the patient's history were reviewed and updated as appropriate: allergies, current medications, past family history, past medical history, past social history, past surgical history and problem list.   Objective:   Vitals:   06/17/22 0822  BP: 105/66  Pulse: 88  Weight: 246 lb (111.6 kg)    Fetal Status: Fetal Heart Rate (bpm): 121 Fundal Height: 26 cm Movement: Present     General:  Alert, oriented and cooperative. Patient is in no acute distress.  Skin: Skin is warm and dry. No rash noted.   Cardiovascular: Normal heart rate noted  Respiratory: Normal respiratory effort, no problems with respiration noted  Abdomen: Soft, gravid, appropriate for gestational age.  Pain/Pressure: Present     Pelvic: Cervical exam deferred        Extremities: Normal range of motion.  Edema: None  Mental Status: Normal mood and affect. Normal behavior. Normal judgment and thought content.   Assessment and Plan:  Pregnancy: G2P1001 at [redacted]w[redacted]d 1. Marginal insertion of umbilical cord affecting management of mother Follow up scan  scheduled 07/09/22.  2. Vasovagal syncope Negative cardiac evaluation, will continue to monitor closely.  Precautions advised.  3. Hx of preeclampsia, prior pregnancy, currently pregnant Stable BP, continue ASA.  4. History of gestational diabetes GTT being done today, will follow up results and manage accordingly.  5. Obesity in pregnancy, antepartum TWG 16 lbs, GTT being done today.  Follow up EFW.  6. [redacted] weeks gestation of pregnancy 7. Supervision of high risk pregnancy, antepartum Labs being done today, will follow up results and manage accordingly. Declined all vaccines for now. - Glucose Tolerance, 2 Hours w/1 Hour - HIV Antibody (routine testing w rflx) - RPR - Comprehensive metabolic panel - CBC - Ferritin Considering BTL vs vasectomy, information given. Medicaid papers signed today.  Preterm labor symptoms and general obstetric precautions including but not limited to vaginal bleeding, contractions, leaking of fluid and fetal movement were reviewed in detail with the patient. Please refer to After Visit Summary for other counseling recommendations.   Return in about 2 weeks (around 07/01/2022) for OFFICE OB VISIT (MD or APP).  Future Appointments  Date Time Provider [place]  06/29/2022  9:45 AM [place] [facility]  07/01/2022 10:55 AM Truett Mainland, DO [facility] None  07/09/2022 10:45 AM [facility] NURSE [facility]  07/09/2022 11:00 AM [facility] US1 [facility]  07/15/2022 10:55 AM Nehemiah Settle, Tanna Savoy, DO [facility] None  07/21/2022  9:00 AM Genia Harold, MD [facility] None  07/27/2022 11:40 AM Berniece Salines, DO [facility] None  09/28/2022  8:40 AM Ngetich, Nelda Bucks, NP [facility] None    Verita Schneiders, MD

## 2022-06-17 NOTE — BH Specialist Note (Signed)
ADULT Comprehensive Clinical Assessment (CCA) Note   06/29/2022 Nancy Garrett [id number]   Referring Provider: Gavin Pound, CNM Session Start time: [phone]    Session End time: 1059  Total time in minutes: 71   SUBJECTIVE: Nancy Garrett is a 32 y.o.   female accompanied by  n/a  Nancy Garrett was seen in consultation at the request of Ngetich, Clark, NP for evaluation of  positive depression screen .  Types of Service: Individual psychotherapy and Video visit  Reason for referral in patient/family's own words:  Feeling tired/emotional    She likes to be called Nancy Garrett.  She came to the appointment with  n/a .  Primary language at home is [place].  Constitutional Appearance: cooperative, well-nourished, well-developed, alert and well-appearing  (Patient to answer as appropriate) Gender identity: Female Sex assigned at birth: Female Pronouns: she   Mental status exam:   General Appearance Nancy Garrett:  Neat Eye Contact:  Good Motor Behavior:  Normal Speech:  Normal Level of Consciousness:  Alert Mood:  NA Affect:  Appropriate Anxiety Level:  Minimal Thought Process:  Coherent Thought Content:  WNL Perception:  Normal Judgment:  Good Insight:  Present   Current Medications and therapies: She is taking:   Outpatient Encounter Medications as of 06/29/2022  Medication Sig   aspirin EC 81 MG tablet Take 1 tablet (81 mg total) by mouth daily. Swallow whole.   ferrous sulfate (FERROUSUL) 325 (65 FE) MG tablet Take 1 tablet (325 mg total) by mouth every other day.   Prenatal Vit-Fe Fumarate-FA (MULTIVITAMIN-PRENATAL) 27-0.8 MG TABS tablet Take 1 tablet by mouth daily at 12 noon.   propranolol (INDERAL) 10 MG tablet Take 1 tablet (10 mg total) by mouth at bedtime.   No facility-administered encounter medications on file as of 06/29/2022.     Therapies:  None  Family history: Family mental illness:  No known history of anxiety disorder, panic disorder, social  anxiety disorder, depression, suicide attempt, suicide completion, bipolar disorder, schizophrenia, eating disorder, personality disorder, OCD, PTSD, ADHD Pt school achievement history:   Bachelors' Degree Other relevant family history:  No known history of substance use or alcoholism  Social History: Now living with  Husband and daughter .Marland Kitchen Employment:   Minimal part time Patient health:  Good, has regular medical care Religious or Spiritual Beliefs: N/a  Mood: She  experiences anxiety . Last PHQ9/GAD7 within two weeks  Negative Mood Concerns She does not make negative statements about self. Self-injury:  No Suicidal ideation:  No Suicide attempt:  No  Additional Anxiety Concerns: Panic attacks:  No Obsessions:  No Compulsions:  No  Stressors:  Birth of a child and Family conflict  Alcohol and/or Substance Use: Have you recently consumed alcohol? no  Have you recently used any drugs?  no  Have you recently consumed any tobacco? no Does patient seem concerned about dependence or abuse of any substance? no  Substance Use Disorder Checklist:  N/a  Severity Risk Scoring based on DSM-5 Criteria for Substance Use Disorder. The presence of at least two (2) criteria in the last 12 months indicate a substance use disorder. The severity of the substance use disorder is defined as:  Mild: Presence of 2-3 criteria Moderate: Presence of 4-5 criteria Severe: Presence of 6 or more criteria  Traumatic Experiences: History or current traumatic events (natural disaster, house fire, etc.)? no History or current physical trauma?  no History or current emotional trauma?  no History or current sexual trauma?  no History or  current domestic or intimate partner violence?  no History of bullying:  no  Risk Assessment: Suicidal or homicidal thoughts?   no Self injurious behaviors?  no Guns in the home?  no  Self Harm Risk Factors: Family or marital conflict  Self Harm Thoughts?:  No  Patient and/or Family's Strengths/Protective Factors: Social connections, Concrete supports in place (healthy food, safe environments, etc.), Sense of purpose, and Physical Health (exercise, healthy diet, medication compliance, etc.)  Patient's and/or Family's Goals in their own words: "Staying sane" in pregnancy   Interventions: Interventions utilized:   Comprehensive Clinical Assessment today    Patient and/or Family Response: Patient agrees with treatment plan.   Standardized Assessments completed:  Not given today  Patient Centered Plan: Patient is on the following Treatment Plan(s):  IBH  Coordination of Care:  Coordination with ob/gyn providers as needed  DSM-5 Diagnosis: Adjustment disorder with anxiety; Psychosocial stress  Recommendations for Services/Supports/Treatments: Continue integrated behavioral health therapeutic interventions  Progress towards Goals: Ongoing  Treatment Plan Summary: Behavioral Health Clinician will: Assess individual's status and evaluate for psychiatric symptoms and Provide coping skills enhancement  Individual will: Report any thoughts or plans of harming themselves or others and Utilize coping skills taught in therapy to reduce symptoms  Referral(s): [place] (In Clinic)  [place], [place]      06/17/2022    9:27 AM 03/09/2022    9:36 AM 02/24/2022   10:22 AM  Depression screen PHQ 2/9  Decreased Interest 0 0 0  Down, Depressed, Hopeless 0 0 0  PHQ - 2 Score 0 0 0  Altered sleeping 1 3 3   Tired, decreased energy 0 2 3  Change in appetite 0 0 3  Feeling bad or failure about yourself  0 0 0  Trouble concentrating 0 1 1  Moving slowly or fidgety/restless 0 0 2  Suicidal thoughts 0 0 0  PHQ-9 Score 1 6 12       06/17/2022    9:27 AM 06/16/2022    9:56 AM 03/09/2022    9:36 AM 02/24/2022   10:23 AM  GAD 7 : Generalized Anxiety Score  Nervous, Anxious, on Edge 1 1 0 0  Control/stop worrying 0 2  0 0  Worry too much - different things 0 0 0 0  Trouble relaxing 1 1 0 3  Restless 0 0 0 3  Easily annoyed or irritable 1 1 0 1  Afraid - awful might happen 0 1 0 1  Total GAD 7 Score 3 6 0 8

## 2022-06-18 LAB — COMPREHENSIVE METABOLIC PANEL
ALT: 8 IU/L (ref 0–32)
AST: 11 IU/L (ref 0–40)
Albumin/Globulin Ratio: 1.4 (ref 1.2–2.2)
Albumin: 4 g/dL (ref 3.9–4.9)
Alkaline Phosphatase: 107 IU/L (ref 44–121)
BUN/Creatinine Ratio: 30 — ABNORMAL HIGH (ref 9–23)
BUN: 12 mg/dL (ref 6–20)
Bilirubin Total: 0.3 mg/dL (ref 0.0–1.2)
CO2: 18 mmol/L — ABNORMAL LOW (ref 20–29)
Calcium: 9.4 mg/dL (ref 8.7–10.2)
Chloride: 101 mmol/L (ref 96–106)
Creatinine, Ser: 0.4 mg/dL — ABNORMAL LOW (ref 0.57–1.00)
Globulin, Total: 2.9 g/dL (ref 1.5–4.5)
Glucose: 69 mg/dL — ABNORMAL LOW (ref 70–99)
Potassium: 4.3 mmol/L (ref 3.5–5.2)
Sodium: 137 mmol/L (ref 134–144)
Total Protein: 6.9 g/dL (ref 6.0–8.5)
eGFR: 135 mL/min/{1.73_m2} (ref >59)

## 2022-06-18 LAB — HIV ANTIBODY (ROUTINE TESTING W REFLEX): HIV Screen 4th Generation wRfx: NONREACTIVE

## 2022-06-18 LAB — CBC
Hematocrit: 33.3 % — ABNORMAL LOW (ref 34.0–46.6)
Hemoglobin: 10.7 g/dL — ABNORMAL LOW (ref 11.1–15.9)
MCH: 27 pg (ref 26.6–33.0)
MCHC: 32.1 g/dL (ref 31.5–35.7)
MCV: 84 fL (ref 79–97)
Platelets: 178 10*3/uL (ref 150–450)
RBC: 3.96 x10E6/uL (ref 3.77–5.28)
RDW: 12.5 % (ref 11.7–15.4)
WBC: 10.2 10*3/uL (ref 3.4–10.8)

## 2022-06-18 LAB — GLUCOSE TOLERANCE, 2 HOURS W/ 1HR
Glucose, 1 hour: 95 mg/dL (ref 70–179)
Glucose, 2 hour: 66 mg/dL — ABNORMAL LOW (ref 70–152)
Glucose, Fasting: 73 mg/dL (ref 70–91)

## 2022-06-18 LAB — FERRITIN: Ferritin: 11 ng/mL — ABNORMAL LOW (ref 15–150)

## 2022-06-18 LAB — RPR: RPR Ser Ql: NONREACTIVE

## 2022-06-19 ENCOUNTER — Encounter: Payer: Self-pay | Admitting: Obstetrics & Gynecology

## 2022-06-19 DIAGNOSIS — D509 Iron deficiency anemia, unspecified: Secondary | ICD-10-CM | POA: Insufficient documentation

## 2022-06-19 MED ORDER — FERROUS SULFATE 325 (65 FE) MG PO TABS: 325.0000 mg | ORAL_TABLET | ORAL | 3 refills | Status: DC

## 2022-06-19 NOTE — Addendum Note (Signed)
Addended by: Verita Schneiders A on: 06/19/2022 06:22 AM   Modules accepted: Orders

## 2022-06-29 ENCOUNTER — Ambulatory Visit (INDEPENDENT_AMBULATORY_CARE_PROVIDER_SITE_OTHER): Payer: Commercial Managed Care - HMO | Admitting: Clinical

## 2022-06-29 DIAGNOSIS — F4322 Adjustment disorder with anxiety: Secondary | ICD-10-CM

## 2022-06-29 DIAGNOSIS — Z658 Other specified problems related to psychosocial circumstances: Secondary | ICD-10-CM

## 2022-06-29 NOTE — Patient Instructions (Signed)
[facility] [address] [place], [place] [address] [phone] (main office) [phone] (Avamarie Crossley's office)   

## 2022-06-30 NOTE — BH Specialist Note (Signed)
Integrated Behavioral Health via Telemedicine Visit  07/13/2022 [name] [id number]  Number of Integrated Behavioral Health Clinician visits: Additional Visit  Session Start time: 2778   Session End time: 1529  Total time in minutes: 43   Referring Provider: Gavin Pound, CNM Patient/Family location: Home [facility] Provider location: [facility] [place] [facility]  All persons participating in visit: [name] and [place]   Types of Service: Individual psychotherapy and Video visit  I connected with [name] and/or Lajean Saver Mottram's  daughter  via  Telephone or Geologist, engineering  (Video is Caregility application) and verified that I am speaking with the correct person using two identifiers. Discussed confidentiality: Yes   I discussed the limitations of telemedicine and the availability of in person appointments.  Discussed there is a possibility of technology failure and discussed alternative modes of communication if that failure occurs.  I discussed that engaging in this telemedicine visit, they consent to the provision of behavioral healthcare and the services will be billed under their insurance.  Patient and/or legal guardian expressed understanding and consented to Telemedicine visit: Yes   Presenting Concerns: Patient and/or family reports the following symptoms/concerns: Processing emotions regarding preparing for baby (met doula, extended family issues, practical preparations for baby supplies and meal prep, baby shower; husband's upcoming surgery).  Duration of problem: Current pregnancy; Severity of problem: moderate  Patient and/or Family's Strengths/Protective Factors: Social connections, Concrete supports in place (healthy food, safe environments, etc.), Sense of purpose, and Physical Health (exercise, healthy diet, medication compliance, etc.)  Goals Addressed: Patient  will:  Reduce symptoms of: anxiety and stress   Demonstrate ability to: Increase healthy adjustment to current life circumstances  Progress towards Goals: Ongoing  Interventions: Interventions utilized:  Supportive Reflection Standardized Assessments completed: Not Needed  Patient and/or Family Response: Patient agrees with treatment plan.   Assessment: Patient currently experiencing Adjustment disorder with anxious mood and Psychosocial stress.   Patient may benefit from continued therapeutic intervention.  Plan: Follow up with behavioral health clinician on : Two weeks Behavioral recommendations:  -Continue taking medications as prescribed -Continue healthy boundaries in all relationships; prioritizing healthy self-care daily -Continue preparations prior to baby's arrival (meal prep, baby shower, mom to stay for at least 3 weeks, etc.) Referral(s): [place] (In Clinic)  I discussed the assessment and treatment plan with the patient and/or parent/guardian. They were provided an opportunity to ask questions and all were answered. They agreed with the plan and demonstrated an understanding of the instructions.   They were advised to call back or seek an in-person evaluation if the symptoms worsen or if the condition fails to improve as anticipated.  Nancy Fair, LCSW     06/17/2022    9:27 AM 03/09/2022    9:36 AM 02/24/2022   10:22 AM  Depression screen PHQ 2/9  Decreased Interest 0 0 0  Down, Depressed, Hopeless 0 0 0  PHQ - 2 Score 0 0 0  Altered sleeping 1 3 3   Tired, decreased energy 0 2 3  Change in appetite 0 0 3  Feeling bad or failure about yourself  0 0 0  Trouble concentrating 0 1 1  Moving slowly or fidgety/restless 0 0 2  Suicidal thoughts 0 0 0  PHQ-9 Score 1 6 12       06/17/2022    9:27 AM 06/16/2022    9:56 AM 03/09/2022    9:36 AM 02/24/2022  10:23 AM  GAD 7 : Generalized Anxiety Score  Nervous, Anxious, on Edge 1 1 0  0  Control/stop worrying 0 2 0 0  Worry too much - different things 0 0 0 0  Trouble relaxing 1 1 0 3  Restless 0 0 0 3  Easily annoyed or irritable 1 1 0 1  Afraid - awful might happen 0 1 0 1  Total GAD 7 Score 3 6 0 8

## 2022-07-01 ENCOUNTER — Encounter: Payer: Commercial Managed Care - HMO | Admitting: Family Medicine

## 2022-07-02 ENCOUNTER — Ambulatory Visit (INDEPENDENT_AMBULATORY_CARE_PROVIDER_SITE_OTHER): Payer: Commercial Managed Care - HMO | Admitting: Family Medicine

## 2022-07-02 VITALS — BP 93/59 | HR 93 | Wt 245.0 lb

## 2022-07-02 DIAGNOSIS — M5430 Sciatica, unspecified side: Secondary | ICD-10-CM

## 2022-07-02 DIAGNOSIS — Z903 Acquired absence of stomach [part of]: Secondary | ICD-10-CM

## 2022-07-02 DIAGNOSIS — Z8632 Personal history of gestational diabetes: Secondary | ICD-10-CM

## 2022-07-02 DIAGNOSIS — O09299 Supervision of pregnancy with other poor reproductive or obstetric history, unspecified trimester: Secondary | ICD-10-CM

## 2022-07-02 DIAGNOSIS — O43193 Other malformation of placenta, third trimester: Secondary | ICD-10-CM

## 2022-07-02 DIAGNOSIS — Z3A28 28 weeks gestation of pregnancy: Secondary | ICD-10-CM

## 2022-07-02 DIAGNOSIS — M79642 Pain in left hand: Secondary | ICD-10-CM

## 2022-07-02 DIAGNOSIS — O09293 Supervision of pregnancy with other poor reproductive or obstetric history, third trimester: Secondary | ICD-10-CM

## 2022-07-02 DIAGNOSIS — O99013 Anemia complicating pregnancy, third trimester: Secondary | ICD-10-CM

## 2022-07-02 DIAGNOSIS — O43199 Other malformation of placenta, unspecified trimester: Secondary | ICD-10-CM

## 2022-07-02 DIAGNOSIS — O0990 Supervision of high risk pregnancy, unspecified, unspecified trimester: Secondary | ICD-10-CM

## 2022-07-02 DIAGNOSIS — D509 Iron deficiency anemia, unspecified: Secondary | ICD-10-CM

## 2022-07-02 NOTE — Progress Notes (Signed)
   PRENATAL VISIT NOTE  Subjective:  Nancy Garrett is a 32 y.o. G2P1001 at [redacted]w[redacted]d being seen today for ongoing prenatal care.  She is currently monitored for the following issues for this high-risk pregnancy and has Facet degeneration of lumbar region; Obesity in pregnancy, antepartum; H/O gastric sleeve; History of gestational diabetes; Hx of preeclampsia, prior pregnancy, currently pregnant; Supervision of high risk pregnancy, antepartum; ASCUS of cervix with negative high risk HPV; Worsening headaches; Marginal insertion of umbilical cord affecting management of mother; and Maternal iron deficiency anemia complicating pregnancy in second trimester on their problem list.  Patient reports  having pain in left wrist and hand. Sciatic pain in left leg .  Contractions: Not present. Vag. Bleeding: None.  Movement: Present. Denies leaking of fluid.   The following portions of the patient's history were reviewed and updated as appropriate: allergies, current medications, past family history, past medical history, past social history, past surgical history and problem list.   Objective:   Vitals:   07/02/22 0904  BP: (!) 93/59  Pulse: 93  Weight: 245 lb (111.1 kg)    Fetal Status: Fetal Heart Rate (bpm): 140 Fundal Height: 28 cm Movement: Present     General:  Alert, oriented and cooperative. Patient is in no acute distress.  Skin: Skin is warm and dry. No rash noted.   Cardiovascular: Normal heart rate noted  Respiratory: Normal respiratory effort, no problems with respiration noted  Abdomen: Soft, gravid, appropriate for gestational age.  Pain/Pressure: Present (patient having muscle cramps)     Pelvic: Cervical exam deferred        Extremities: Normal range of motion.  Edema: None  Mental Status: Normal mood and affect. Normal behavior. Normal judgment and thought content.   Assessment and Plan:  Pregnancy: G2P1001 at [redacted]w[redacted]d 1. Supervision of high risk pregnancy, antepartum FHT and FH  normal  2. Hx of preeclampsia, prior pregnancy, currently pregnant BP normal ASA 81mg   3. History of gestational diabetes Normal  4. H/O gastric sleeve  5. Marginal insertion of umbilical cord affecting management of mother Normal growth  6. Maternal iron deficiency anemia complicating pregnancy in second trimester On oral iron  7. Hand pain, left - AMB referral to sports medicine  8. Sciatic leg pain - AMB referral to sports medicine  Preterm labor symptoms and general obstetric precautions including but not limited to vaginal bleeding, contractions, leaking of fluid and fetal movement were reviewed in detail with the patient. Please refer to After Visit Summary for other counseling recommendations.   No follow-ups on file.  Future Appointments  Date Time Provider [place]  07/09/2022 10:45 AM [facility] NURSE [facility]  07/09/2022 11:00 AM [facility] US1 [facility]  07/13/2022  2:45 PM [place] [facility]  07/15/2022 10:55 AM Truett Mainland, DO [facility] None  07/21/2022  9:00 AM Genia Harold, MD [facility]NA None  07/27/2022 11:40 AM Tobb, Godfrey Pick, DO [facility] None  07/31/2022  9:55 AM Truett Mainland, DO C[facility]-WMHP None  08/13/2022 10:55 AM Truett Mainland, DO [facility]-WM[facility] None  08/19/2022 10:55 AM Truett Mainland, DO [facility] None  08/26/2022 10:55 AM Truett Mainland, DO [facility] None  09/02/2022 10:55 AM Truett Mainland, DO [facility]-WM[facility] None  09/09/2022 10:55 AM Truett Mainland, DO C[facility] None  09/28/2022  8:40 AM Ngetich, Nelda Bucks, NP PSC-[facility] None    Truett Mainland, DO

## 2022-07-06 ENCOUNTER — Ambulatory Visit: Payer: Commercial Managed Care - HMO

## 2022-07-07 ENCOUNTER — Encounter: Payer: Self-pay | Admitting: Family Medicine

## 2022-07-07 ENCOUNTER — Ambulatory Visit (INDEPENDENT_AMBULATORY_CARE_PROVIDER_SITE_OTHER): Payer: Commercial Managed Care - HMO | Admitting: Family Medicine

## 2022-07-07 VITALS — BP 108/68 | Ht 66.0 in | Wt 246.0 lb

## 2022-07-07 DIAGNOSIS — G5622 Lesion of ulnar nerve, left upper limb: Secondary | ICD-10-CM | POA: Diagnosis not present

## 2022-07-07 DIAGNOSIS — M5416 Radiculopathy, lumbar region: Secondary | ICD-10-CM | POA: Diagnosis not present

## 2022-07-07 NOTE — Assessment & Plan Note (Signed)
Acutely occurring.  Symptoms are most consistent with a radicular pattern. -Counseled On home exercise therapy and supportive care. -Could consider physical therapy or further imaging

## 2022-07-07 NOTE — Patient Instructions (Signed)
Nice to meet you Please try heat on the back  Please try the exercises and stretches You can take tylenol for pain  Please send me a message in MyChart with any questions or updates.  Please see me back in 4-6 weeks.   --Dr. Raeford Razor

## 2022-07-07 NOTE — Assessment & Plan Note (Signed)
Acutely occurring.  Symptoms are affecting the fourth and fifth digit.  She works as a Emergency planning/management officer. -Counseled on home exercise therapy and supportive care. -Could consider physical therapy

## 2022-07-07 NOTE — Progress Notes (Signed)
  Nancy Garrett - 32 y.o. female MRN [id number]  Date of birth: 15-Dec-1990  SUBJECTIVE:  Including CC & ROS.  No chief complaint on file.   Nancy Garrett is a 32 y.o. female that is presenting with acute on chronic left leg pain and acute left hand pain.  The pain has gotten worse over the past 2 months.  She has done physical therapy previously for her low back and left leg pain.   Review of Systems See HPI   HISTORY: Past Medical, Surgical, Social, and Family History Reviewed & Updated per EMR.   Pertinent Historical Findings include:  Past Medical History:  Diagnosis Date   Chronic abdominal pain    Head ache     Past Surgical History:  Procedure Laterality Date   GASTRECTOMY  2019   Dr. Jeb Levering.   LAPAROSCOPIC GASTRIC SLEEVE RESECTION     TONSILLECTOMY       PHYSICAL EXAM:  VS: BP 108/68   Ht 5\' 6"  (1.676 m)   Wt 246 lb (111.6 kg)   LMP 12/17/2021   BMI 39.71 kg/m  Physical Exam Gen: NAD, alert, cooperative with exam, well-appearing MSK:  Neurovascularly intact       ASSESSMENT & PLAN:   Lumbar radiculopathy Acutely occurring.  Symptoms are most consistent with a radicular pattern. -Counseled On home exercise therapy and supportive care. -Could consider physical therapy or further imaging  Ulnar neuropathy at elbow of left upper extremity Acutely occurring.  Symptoms are affecting the fourth and fifth digit.  She works as a Emergency planning/management officer. -Counseled on home exercise therapy and supportive care. -Could consider physical therapy

## 2022-07-09 ENCOUNTER — Ambulatory Visit (HOSPITAL_BASED_OUTPATIENT_CLINIC_OR_DEPARTMENT_OTHER): Payer: Commercial Managed Care - HMO

## 2022-07-09 ENCOUNTER — Other Ambulatory Visit: Payer: Self-pay | Admitting: *Deleted

## 2022-07-09 ENCOUNTER — Ambulatory Visit: Payer: Commercial Managed Care - HMO | Attending: Maternal & Fetal Medicine | Admitting: *Deleted

## 2022-07-09 VITALS — BP 134/77 | HR 94

## 2022-07-09 DIAGNOSIS — O99842 Bariatric surgery status complicating pregnancy, second trimester: Secondary | ICD-10-CM

## 2022-07-09 DIAGNOSIS — O99212 Obesity complicating pregnancy, second trimester: Secondary | ICD-10-CM

## 2022-07-09 DIAGNOSIS — O43193 Other malformation of placenta, third trimester: Secondary | ICD-10-CM

## 2022-07-09 DIAGNOSIS — O09293 Supervision of pregnancy with other poor reproductive or obstetric history, third trimester: Secondary | ICD-10-CM

## 2022-07-09 DIAGNOSIS — O99891 Other specified diseases and conditions complicating pregnancy: Secondary | ICD-10-CM

## 2022-07-09 DIAGNOSIS — O99213 Obesity complicating pregnancy, third trimester: Secondary | ICD-10-CM | POA: Diagnosis present

## 2022-07-09 DIAGNOSIS — R002 Palpitations: Secondary | ICD-10-CM | POA: Diagnosis not present

## 2022-07-09 DIAGNOSIS — E669 Obesity, unspecified: Secondary | ICD-10-CM

## 2022-07-09 DIAGNOSIS — O99843 Bariatric surgery status complicating pregnancy, third trimester: Secondary | ICD-10-CM | POA: Diagnosis not present

## 2022-07-09 DIAGNOSIS — R519 Headache, unspecified: Secondary | ICD-10-CM

## 2022-07-09 DIAGNOSIS — O0990 Supervision of high risk pregnancy, unspecified, unspecified trimester: Secondary | ICD-10-CM

## 2022-07-09 DIAGNOSIS — Z362 Encounter for other antenatal screening follow-up: Secondary | ICD-10-CM | POA: Diagnosis not present

## 2022-07-09 DIAGNOSIS — Z3A29 29 weeks gestation of pregnancy: Secondary | ICD-10-CM | POA: Insufficient documentation

## 2022-07-09 DIAGNOSIS — Z8632 Personal history of gestational diabetes: Secondary | ICD-10-CM

## 2022-07-09 DIAGNOSIS — Z903 Acquired absence of stomach [part of]: Secondary | ICD-10-CM

## 2022-07-13 ENCOUNTER — Ambulatory Visit (INDEPENDENT_AMBULATORY_CARE_PROVIDER_SITE_OTHER): Payer: Commercial Managed Care - HMO | Admitting: Clinical

## 2022-07-13 ENCOUNTER — Ambulatory Visit: Payer: Commercial Managed Care - HMO | Admitting: Psychiatry

## 2022-07-13 DIAGNOSIS — Z658 Other specified problems related to psychosocial circumstances: Secondary | ICD-10-CM

## 2022-07-13 DIAGNOSIS — F4322 Adjustment disorder with anxiety: Secondary | ICD-10-CM

## 2022-07-14 NOTE — BH Specialist Note (Signed)
Integrated Behavioral Health via Telemedicine Visit  07/28/2022 Tyliah Denholm [id number]  Number of Integrated Behavioral Health Clinician visits: Additional Visit  Session Start time: [id number]   Session End time: [id number]  Total time in minutes: 50  Referring Provider: Gavin Pound, CNM Patient/Family location: Home [facility] Provider location: [facility] for [place] [facility]  All persons participating in visit: [name] and [place]   Types of Service: Individual psychotherapy and Video visit  I connected with [name] and/or Lajean Saver Laughman's  n/a  via  Telephone or Video Enabled Telemedicine Application  (Video is Caregility application) and verified that I am speaking with the correct person using two identifiers. Discussed confidentiality: Yes   I discussed the limitations of telemedicine and the availability of in person appointments.  Discussed there is a possibility of technology failure and discussed alternative modes of communication if that failure occurs.  I discussed that engaging in this telemedicine visit, they consent to the provision of behavioral healthcare and the services will be billed under their insurance.  Patient and/or legal guardian expressed understanding and consented to Telemedicine visit: Yes   Presenting Concerns: Patient and/or family reports the following symptoms/concerns: Ongoing anxiety and conflict with in-laws; hopeful with upcoming baby shower, waterbirth class and working on birth plan with doula.  Duration of problem: Current pregnancy; Severity of problem: moderate  Patient and/or Family's Strengths/Protective Factors: Social connections, Concrete supports in place (healthy food, safe environments, etc.), Sense of purpose, and Physical Health (exercise, healthy diet, medication compliance, etc.)  Goals Addressed: Patient will:  Reduce symptoms of: anxiety and stress   Increase  knowledge and/or ability of: stress reduction   Demonstrate ability to: Increase healthy adjustment to current life circumstances  Progress towards Goals: Ongoing  Interventions: Interventions utilized:  Solution-Focused Strategies Standardized Assessments completed: Not Needed  Patient and/or Family Response: Patient agrees with treatment plan.   Assessment: Patient currently experiencing Adjustment disorder with anxious mood and Psychosocial stress.   Patient may benefit from continued therapeutic interventions.  Plan: Follow up with behavioral health clinician on : Three weeks Behavioral recommendations:  -Continue taking all medications as prescribed -Continue setting healthy boundaries in all relationships as a form of self-care -Call to schedule waterbirth class to keep this option open; discuss with medical provider at next visit -Continue plan to work with birth doula on birth plan -Continue any final preparations for baby shower (for March 9) Referral(s): [place] (In Clinic)  I discussed the assessment and treatment plan with the patient and/or parent/guardian. They were provided an opportunity to ask questions and all were answered. They agreed with the plan and demonstrated an understanding of the instructions.   They were advised to call back or seek an in-person evaluation if the symptoms worsen or if the condition fails to improve as anticipated.  [name], LCSW     06/17/2022    9:27 AM 03/09/2022    9:36 AM 02/24/2022   10:22 AM  Depression screen PHQ 2/9  Decreased Interest 0 0 0  Down, Depressed, Hopeless 0 0 0  PHQ - 2 Score 0 0 0  Altered sleeping 1 3 3  $ Tired, decreased energy 0 2 3  Change in appetite 0 0 3  Feeling bad or failure about yourself  0 0 0  Trouble concentrating 0 1 1  Moving slowly or fidgety/restless 0 0 2  Suicidal thoughts 0 0 0  PHQ-9 Score 1 6 12  06/17/2022    9:27 AM 06/16/2022    9:56  AM 03/09/2022    9:36 AM 02/24/2022   10:23 AM  GAD 7 : Generalized Anxiety Score  Nervous, Anxious, on Edge 1 1 0 0  Control/stop worrying 0 2 0 0  Worry too much - different things 0 0 0 0  Trouble relaxing 1 1 0 3  Restless 0 0 0 3  Easily annoyed or irritable 1 1 0 1  Afraid - awful might happen 0 1 0 1  Total GAD 7 Score 3 6 0 8

## 2022-07-15 ENCOUNTER — Ambulatory Visit (INDEPENDENT_AMBULATORY_CARE_PROVIDER_SITE_OTHER): Payer: Commercial Managed Care - HMO | Admitting: Family Medicine

## 2022-07-15 VITALS — BP 103/59 | HR 101 | Wt 246.0 lb

## 2022-07-15 DIAGNOSIS — O43199 Other malformation of placenta, unspecified trimester: Secondary | ICD-10-CM

## 2022-07-15 DIAGNOSIS — O09299 Supervision of pregnancy with other poor reproductive or obstetric history, unspecified trimester: Secondary | ICD-10-CM

## 2022-07-15 DIAGNOSIS — Z8632 Personal history of gestational diabetes: Secondary | ICD-10-CM

## 2022-07-15 DIAGNOSIS — Z903 Acquired absence of stomach [part of]: Secondary | ICD-10-CM

## 2022-07-15 DIAGNOSIS — O99013 Anemia complicating pregnancy, third trimester: Secondary | ICD-10-CM

## 2022-07-15 DIAGNOSIS — R87610 Atypical squamous cells of undetermined significance on cytologic smear of cervix (ASC-US): Secondary | ICD-10-CM

## 2022-07-15 DIAGNOSIS — O99012 Anemia complicating pregnancy, second trimester: Secondary | ICD-10-CM

## 2022-07-15 DIAGNOSIS — O43193 Other malformation of placenta, third trimester: Secondary | ICD-10-CM

## 2022-07-15 DIAGNOSIS — O09293 Supervision of pregnancy with other poor reproductive or obstetric history, third trimester: Secondary | ICD-10-CM

## 2022-07-15 DIAGNOSIS — O0993 Supervision of high risk pregnancy, unspecified, third trimester: Secondary | ICD-10-CM

## 2022-07-15 DIAGNOSIS — O0990 Supervision of high risk pregnancy, unspecified, unspecified trimester: Secondary | ICD-10-CM

## 2022-07-15 DIAGNOSIS — D509 Iron deficiency anemia, unspecified: Secondary | ICD-10-CM

## 2022-07-15 DIAGNOSIS — Z3A30 30 weeks gestation of pregnancy: Secondary | ICD-10-CM

## 2022-07-15 NOTE — Progress Notes (Signed)
   PRENATAL VISIT NOTE  Subjective:  Nancy Garrett is a 32 y.o. G2P1001 at [redacted]w[redacted]d being seen today for ongoing prenatal care.  She is currently monitored for the following issues for this low-risk pregnancy and has Facet degeneration of lumbar region; Obesity in pregnancy, antepartum; H/O gastric sleeve; History of gestational diabetes; Hx of preeclampsia, prior pregnancy, currently pregnant; Supervision of high risk pregnancy, antepartum; ASCUS of cervix with negative high risk HPV; Worsening headaches; Marginal insertion of umbilical cord affecting management of mother; Maternal iron deficiency anemia complicating pregnancy in second trimester; Ulnar neuropathy at elbow of left upper extremity; and Lumbar radiculopathy on their problem list.  Patient reports no complaints.  Contractions: Not present. Vag. Bleeding: None.  Movement: Present. Denies leaking of fluid.   The following portions of the patient's history were reviewed and updated as appropriate: allergies, current medications, past family history, past medical history, past social history, past surgical history and problem list.   Objective:   Vitals:   07/15/22 1055  BP: (!) 103/59  Pulse: (!) 101  Weight: 246 lb (111.6 kg)    Fetal Status: Fetal Heart Rate (bpm): 133   Movement: Present     General:  Alert, oriented and cooperative. Patient is in no acute distress.  Skin: Skin is warm and dry. No rash noted.   Cardiovascular: Normal heart rate noted  Respiratory: Normal respiratory effort, no problems with respiration noted  Abdomen: Soft, gravid, appropriate for gestational age.  Pain/Pressure: Present     Pelvic: Cervical exam deferred        Extremities: Normal range of motion.  Edema: None  Mental Status: Normal mood and affect. Normal behavior. Normal judgment and thought content.   Assessment and Plan:  Pregnancy: G2P1001 at [redacted]w[redacted]d 1. [redacted] weeks gestation of pregnancy   2. Supervision of high risk pregnancy,  antepartum FHT and FH normal  3. H/O gastric sleeve  4. Hx of preeclampsia, prior pregnancy, currently pregnant BP normal  5. History of gestational diabetes Normal GTT  6. ASCUS of cervix with negative high risk HPV  7. Marginal insertion of umbilical cord affecting management of mother EFW 34%  8. Maternal iron deficiency anemia complicating pregnancy in second trimester Iron supplement  Preterm labor symptoms and general obstetric precautions including but not limited to vaginal bleeding, contractions, leaking of fluid and fetal movement were reviewed in detail with the patient. Please refer to After Visit Summary for other counseling recommendations.   No follow-ups on file.  Future Appointments  Date Time Provider [place]  07/21/2022  9:00 AM Genia Harold, MD [facility] None  07/27/2022 11:40 AM Tobb, Godfrey Pick, DO [facility] None  07/28/2022  2:45 PM [place] [facility]  07/31/2022  9:55 AM Truett Mainland, DO C[facility]-WMHP None  08/13/2022 10:55 AM Truett Mainland, DO [facility] None  08/17/2022  8:50 AM Rosemarie Ax, MD [facility]  08/19/2022 10:55 AM Truett Mainland, DO [facility]WM[facility] None  08/21/2022 11:15 AM [facility] NURSE [facility]  08/21/2022 11:30 AM [facility] US3 [facility]  08/26/2022 10:55 AM Truett Mainland, DO [facility] None  09/02/2022 10:55 AM Truett Mainland, DO [facility] None  09/09/2022 10:55 AM Truett Mainland, DO [facility] None  10/26/2022 10:00 AM Ngetich, Nelda Bucks, NP PSC-[facility] None    Truett Mainland, DO

## 2022-07-21 ENCOUNTER — Ambulatory Visit: Payer: Commercial Managed Care - HMO | Admitting: Psychiatry

## 2022-07-27 ENCOUNTER — Telehealth: Payer: Self-pay

## 2022-07-27 ENCOUNTER — Encounter: Payer: Self-pay | Admitting: Cardiology

## 2022-07-27 ENCOUNTER — Ambulatory Visit: Payer: Commercial Managed Care - HMO | Attending: Cardiology | Admitting: Cardiology

## 2022-07-27 VITALS — BP 106/62 | HR 101 | Ht 66.0 in | Wt 249.6 lb

## 2022-07-27 DIAGNOSIS — O09299 Supervision of pregnancy with other poor reproductive or obstetric history, unspecified trimester: Secondary | ICD-10-CM | POA: Diagnosis not present

## 2022-07-27 DIAGNOSIS — O99210 Obesity complicating pregnancy, unspecified trimester: Secondary | ICD-10-CM

## 2022-07-27 DIAGNOSIS — Z3A32 32 weeks gestation of pregnancy: Secondary | ICD-10-CM | POA: Diagnosis not present

## 2022-07-27 NOTE — Progress Notes (Unsigned)
[facility]  New Evaluation  Date:  07/29/2022   ID:  Nancy Garrett, DOB 1991-01-06, MRN [id number]  PCP:  Sandrea Hughs, NP   [place] Providers Cardiologist:  Berniece Salines, DO  Electrophysiologist:  None       Referring MD: Sandrea Hughs, NP   Chief Complaint: " I am having palpitations"  History of Present Illness:    Nancy Garrett is a 32 y.o. female [G2P1001] who is being seen today for the evaluation of shortness of breath and palpitations at the request of Ngetich, Dinah C, NP.   Medical history includes history of pre-eclampsia in her first pregnancy which was 6 years ago, gestational diabetes and headaches. Here today with her husband and daughter to be evaluated for dyspnea on exertion and palpitations.  At her last visit she was experiencing intermittent palpitations- with associated syncope or presyncope. Placed a zio monitor on the patient and scheduled her for an echo.   At her last visit she had had 2 syncope episodes that was associated with palpitation.  I started her on propranolol.  Since I saw the patient the palpitations has improved significantly.  No syncope episode. However she notes that the infrequent symptoms that she had the palpitation lasts longer.   Prior CV Studies Reviewed: The following studies were reviewed today:  Zio monitor 04/15/2022 Patch Wear Time:  13 days and 8 hours (2023-10-06T16:15:18-0400 to 2023-10-20T00:16:59-0400)   Patient had a min HR of 59 bpm, max HR of 168 bpm, and avg HR of 88 bpm. Predominant underlying rhythm was Sinus Rhythm. Isolated SVEs were rare (<1.0%), and no SVE Couplets or SVE Triplets were present. Isolated VEs were rare (<1.0%), VE Couplets were  rare (<1.0%), and no VE Triplets were present.    Symptoms associated with sinus tachycardia.   Conclusion: Normal/Unremarkable study.  TTE 2023 IMPRESSIONS   1. Left ventricular ejection fraction, by estimation, is 60 to 65%.  The left ventricle has normal function. The left ventricle has no regional wall motion abnormalities. Left ventricular diastolic parameters were normal.   2. Right ventricular systolic function is normal. The right ventricular size is normal. There is normal pulmonary artery systolic pressure.   3. Left atrial size was mildly dilated.   4. The mitral valve is abnormal. Trivial mitral valve regurgitation. No evidence of mitral stenosis.   5. The aortic valve is tricuspid. Aortic valve regurgitation is not visualized. No aortic stenosis is present.   6. The inferior vena cava is normal in size with greater than 50% respiratory variability, suggesting right atrial pressure of 3 mmHg.   FINDINGS   Left Ventricle: Left ventricular ejection fraction, by estimation, is 60 to 65%. The left ventricle has normal function. The left ventricle has no regional wall motion abnormalities. The left ventricular internal cavity size was normal in size. There is  no left ventricular hypertrophy. Left ventricular diastolic parameters were normal.   Right Ventricle: The right ventricular size is normal. No increase in right ventricular wall thickness. Right ventricular systolic function is normal. There is normal pulmonary artery systolic pressure. The tricuspid regurgitant velocity is 2.12 m/s, and  with an assumed right atrial pressure of 3 mmHg, the estimated right ventricular systolic pressure is [id number] mmHg.   Left Atrium: Left atrial size was mildly dilated.   Right Atrium: Right atrial size was normal in size.   Pericardium: There is no evidence of pericardial effusion.   Mitral Valve: The mitral valve is abnormal. There is  mild thickening of the mitral valve leaflet(s). Trivial mitral valve regurgitation. No evidence of mitral valve stenosis.   Tricuspid Valve: The tricuspid valve is normal in structure. Tricuspid valve regurgitation is mild . No evidence of tricuspid stenosis.   Aortic Valve: The aortic valve  is tricuspid. Aortic valve regurgitation is not visualized. No aortic stenosis is present.   Pulmonic Valve: The pulmonic valve was normal in structure. Pulmonic valve regurgitation is not visualized. No evidence of pulmonic stenosis.   Aorta: The aortic root is normal in size and structure.   Venous: The inferior vena cava is normal in size with greater than 50% respiratory variability, suggesting right atrial pressure of 3 mmHg.   IAS/Shunts: No atrial level shunt detected by color flow Doppler.   Past Medical History:  Diagnosis Date   Chronic abdominal pain    Head ache     Past Surgical History:  Procedure Laterality Date   GASTRECTOMY  2019   Dr. Jeb Levering.   LAPAROSCOPIC GASTRIC SLEEVE RESECTION     TONSILLECTOMY        OB History     Gravida  2   Para  1   Term  1   Preterm      AB      Living  1      SAB      IAB      Ectopic      Multiple      Live Births  1               Current Medications: Current Meds  Medication Sig   aspirin EC 81 MG tablet Take 1 tablet (81 mg total) by mouth daily. Swallow whole.   ferrous sulfate (FERROUSUL) 325 (65 FE) MG tablet Take 1 tablet (325 mg total) by mouth every other day.   Prenatal Vit-Fe Fumarate-FA (MULTIVITAMIN-PRENATAL) 27-0.8 MG TABS tablet Take 1 tablet by mouth daily at 12 noon.   propranolol (INDERAL) 10 MG tablet Take 1 tablet (10 mg total) by mouth at bedtime.     Allergies:   Iodinated contrast media and Ivp dye [iodinated contrast media]   Social History   Socioeconomic History   Marital status: Married    Spouse name: Not on file   Number of children: Not on file   Years of education: Not on file   Highest education level: Not on file  Occupational History   Not on file  Tobacco Use   Smoking status: Never   Smokeless tobacco: Never  Vaping Use   Vaping Use: Never used  Substance and Sexual Activity   Alcohol use: Yes    Alcohol/week: 2.0 standard drinks of alcohol     Types: 2 Glasses of wine per week   Drug use: Never   Sexual activity: Yes    Birth control/protection: I.U.D.  Other Topics Concern   Not on file  Social History Narrative   ** Merged History Encounter **       Tobacco use, amount per day now: Past tobacco use, amount per day: How many years did you use tobacco: Alcohol use (drinks per week): Diet: Do you drink/eat things with caffeine: No Marital status:   Married                               What year    were you married? 2022 Do you live in a house, apartment, assisted living,  condo, trailer, etc.? House Is it one or more stories? One How many persons live in your home? 3  Do you have pets in your home?( please list) No Highest Level of education c   ompleted? College Current or past profession: Theatre manager, Paramedic. Do you exercise? No                                 Type and how often? Do you have a living will? Yes Do you have a DNR form?   No                               If not, do you    want to discuss one? Do you have signed POA/HPOA forms?    No                    If so, please bring to you appointment  Do you have any difficulty bathing or dressing yourself? Yes Do you have any difficulty preparing food or eating? No Do you hav   e any difficulty managing your medications? No Do you have any difficulty managing your finances? No Do you have any difficulty affording your medications? No   Social Determinants of Health   Financial Resource Strain: Not on file  Food Insecurity: Food Insecurity Present (03/09/2022)   Hunger Vital Sign    Worried About Running Out of Food in the Last Year: Never true    Ran Out of Food in the Last Year: Sometimes true  Transportation Needs: No Transportation Needs (03/09/2022)   PRAPARE - Hydrologist (Medical): No    Lack of Transportation (Non-Medical): No  Physical Activity: Not on file  Stress: Not on file  Social Connections: Not on  file      Family History  Problem Relation Age of Onset   High blood pressure Mother    Diabetes Father    Diabetes Sister    Stroke Sister    High blood pressure Sister       ROS:   Please see the history of present illness.    Reports palpiations and shortness of breath All other systems reviewed and are negative.   Labs/EKG Reviewed:    EKG:   EKG is was ordered today.  The ekg ordered today demonstrates NSR HR   Recent Labs: 03/23/2022: TSH 1.010 06/17/2022: ALT 8; BUN 12; Creatinine, Ser 0.40; Hemoglobin 10.7; Platelets 178; Potassium 4.3; Sodium 137   Recent Lipid Panel Lab Results  Component Value Date/Time   CHOL 255 (H) 09/22/2021 11:10 AM   TRIG 49 09/22/2021 11:10 AM   HDL 70 09/22/2021 11:10 AM   CHOLHDL 3.6 09/22/2021 11:10 AM   LDLCALC 170 (H) 09/22/2021 11:10 AM    Physical Exam:    VS:  BP 106/62   Pulse (!) 101   Ht 5' 6"$  (1.676 m)   Wt 113.2 kg   LMP 12/17/2021   SpO2 96%   BMI 40.29 kg/m     Wt Readings from Last 3 Encounters:  07/27/22 113.2 kg  07/15/22 111.6 kg  07/07/22 111.6 kg     GEN:  Well nourished, well developed in no acute distress HEENT: Normal NECK: No JVD; No carotid bruits LYMPHATICS: No lymphadenopathy CARDIAC: RRR, no murmurs, rubs, gallops RESPIRATORY:  Clear to auscultation without rales, wheezing  or rhonchi  ABDOMEN: Soft, non-tender, non-distended MUSCULOSKELETAL:  No edema; No deformity  SKIN: Warm and dry NEUROLOGIC:  Alert and oriented x 3 PSYCHIATRIC:  Normal affect    Risk Assessment/Risk Calculators:     CARPREG II Risk Prediction Index Score:  1.  The patient's risk for a primary cardiac event is 5%.            ASSESSMENT & PLAN:    Syncope Palpitations  Her symptoms had not resolved.  What I will do at this point is increase her propranolol to 10 mg twice a day.   I suspect that we might have to titrate this medicine of postpartum.  But we will continue to monitor.  FU in 12 weeks  postpartum.  Patient Instructions  Medication Instructions:  Your physician recommends that you continue on your current medications as directed. Please refer to the Current Medication list given to you today.  *If you need a refill on your cardiac medications before your next appointment, please call your pharmacy*   Lab Work: None If you have labs (blood work) drawn today and your tests are completely normal, you will receive your results only by: [place] (if you have MyChart) OR A paper copy in the mail If you have any lab test that is abnormal or we need to change your treatment, we will call you to review the results.   Testing/Procedures: None   Follow-Up: At [facility], you and your health needs are our priority.  As part of our continuing mission to provide you with exceptional heart care, we have created designated Provider Care Teams.  These Care Teams include your primary Cardiologist (physician) and Advanced Practice Providers (APPs -  Physician Assistants and Nurse Practitioners) who all work together to provide you with the care you need, when you need it.  Your next appointment:   10 weeks postpartum (Last week in June)   Provider:   Berniece Salines, DO      Dispo:  No follow-ups on file.   Medication Adjustments/Labs and Tests Ordered: Current medicines are reviewed at length with the patient today.  Concerns regarding medicines are outlined above.  Tests Ordered: No orders of the defined types were placed in this encounter.  Medication Changes: No orders of the defined types were placed in this encounter.

## 2022-07-27 NOTE — Telephone Encounter (Signed)
Called pt to have her come in at 1:20 today. She was agreeable.

## 2022-07-27 NOTE — Patient Instructions (Addendum)
Medication Instructions:  Your physician recommends that you continue on your current medications as directed. Please refer to the Current Medication list given to you today.  *If you need a refill on your cardiac medications before your next appointment, please call your pharmacy*   Lab Work: None If you have labs (blood work) drawn today and your tests are completely normal, you will receive your results only by: [place] (if you have MyChart) OR A paper copy in the mail If you have any lab test that is abnormal or we need to change your treatment, we will call you to review the results.   Testing/Procedures: None   Follow-Up: At [facility], you and your health needs are our priority.  As part of our continuing mission to provide you with exceptional heart care, we have created designated Provider Care Teams.  These Care Teams include your primary Cardiologist (physician) and Advanced Practice Providers (APPs -  Physician Assistants and Nurse Practitioners) who all work together to provide you with the care you need, when you need it.  Your next appointment:   10 weeks postpartum (Last week in June)   Provider:   Berniece Salines, DO

## 2022-07-28 ENCOUNTER — Ambulatory Visit (INDEPENDENT_AMBULATORY_CARE_PROVIDER_SITE_OTHER): Payer: Commercial Managed Care - HMO | Admitting: Clinical

## 2022-07-28 DIAGNOSIS — F4322 Adjustment disorder with anxiety: Secondary | ICD-10-CM

## 2022-07-28 DIAGNOSIS — Z658 Other specified problems related to psychosocial circumstances: Secondary | ICD-10-CM

## 2022-07-28 NOTE — Patient Instructions (Signed)
[facility] [address] [place], [place] [address] [phone] (main office) [phone] (Andrika Peraza's office)     BRAINSTORMING  Develop a Plan Goals: Provide a way to start conversation about your new life with a baby Assist parents in recognizing and using resources within their reach Help pave the way before birth for an easier period of transition afterwards.  Make a list of the following information to keep in a central location: Full name of Mom and Partner: _____________________________________________ Baby's full name and Date of Birth: ___________________________________________ Home Address: ___________________________________________________________ ________________________________________________________________________ Home Phone: ____________________________________________________________ Parents' cell numbers: _____________________________________________________ ________________________________________________________________________ Name and contact info for OB: ______________________________________________ Name and contact info for Pediatrician:________________________________________ Contact info for Lactation Consultants: ________________________________________  REST and SLEEP *You each need at least 4-5 hours of uninterrupted sleep every day. Write specific names and contact information.* How are you going to rest in the postpartum period? While partner's home? When partner returns to work? When you both return to work? Where will your baby sleep? Who is available to help during the day? Evening? Night? Who could move in for a period to help support you? What are some ideas to help you get enough  sleep? __________________________________________________________________________________________________________________________________________________________________________________________________________________________________________ NUTRITIOUS FOOD AND DRINK *Plan for meals before your baby is born so you can have healthy food to eat during the immediate postpartum period.* Who will look after breakfast? Lunch? Dinner? List names and contact information. Brainstorm quick, healthy ideas for each meal. What can you do before baby is born to prepare meals for the postpartum period? How can others help you with meals? Which grocery stores provide online shopping and delivery? Which restaurants offer take-out or delivery options? ______________________________________________________________________________________________________________________________________________________________________________________________________________________________________________________________________________________________________________________________________________________________________________________________________  CARE FOR MOM *It's important that mom is cared for and pampered in the postpartum period. Remember, the most important ways new mothers need care are: sleep, nutrition, gentle exercise, and time off.* Who can come take care of mom during this period? Make a list of people with their contact information. List some activities that make you feel cared for, rested, and energized? Who can make sure you have opportunities to do these things? Does mom have a space of her very own within your home that's just for her? Make a "Mama Cave" where she can be comfortable, rest, and renew herself  daily. ______________________________________________________________________________________________________________________________________________________________________________________________________________________________________________________________________________________________________________________________________________________________________________________________________    CARE FOR AND FEEDING BABY *Knowledgeable and encouraging people will offer the best support with regard to feeding your baby.* Educate yourself and choose the best feeding option for your baby. Make a list of people who will guide, support, and be a resource for you as your care for and feed your baby. (Friends that have breastfed or are currently breastfeeding, lactation consultants, breastfeeding support groups, etc.) Consider a postpartum doula. (These websites can give you information: dona.org & padanc.org) Seek out local breastfeeding resources like the breastfeeding support group at [facility] or [employer]. ______________________________________________________________________________________________________________________________________________________________________________________________________________________________________________________________________________________________________________________________________________________________________________________________________  CHORES AND ERRANDS Who can help with a thorough cleaning before baby is born? Make a list of people who will help with housekeeping and chores, like laundry, light cleaning, dishes, bathrooms, etc. Who can run some errands for you? What can you do to make sure you are stocked with basic supplies before baby is born? Who is going to do the  shopping? ______________________________________________________________________________________________________________________________________________________________________________________________________________________________________________________________________________________________________________________________________________________________________________________________________     Family Adjustment *Nurture yourselves.it helps parents be more loving and allows for better bonding with their child.* What sorts of things do you and partner enjoy   doing together? Which activities help you to connect and strengthen your relationship? Make a list of those things. Make a list of people whom you trust to care for your baby so you can have some time together as a couple. What types of things help partner feel connected to Mom? Make a list. What needs will partner have in order to bond with baby? Other children? Who will care for them when you go into labor and while you are in the hospital? Think about what the needs of your older children might be. Who can help you meet those needs? In what ways are you helping them prepare for bringing baby home? List some specific strategies you have for family adjustment. _______________________________________________________________________________________________________________________________________________________________________________________________________________________________________________________________________________________________________________________________________________  SUPPORT *Someone who can empathize with experiences normalizes your problems and makes them more bearable.* Make a list of other friends, neighbors, and/or co-workers you know with infants (and small children, if applicable) with whom you can connect. Make a list of local or online support groups, mom groups, etc. in which you can be  involved. ______________________________________________________________________________________________________________________________________________________________________________________________________________________________________________________________________________________________________________________________________________________________________________________________________  Childcare Plans Investigate and plan for childcare if mom is returning to work. Talk about mom's concerns about her transition back to work. Talk about partner's concerns regarding this transition.  Mental Health *Your mental health is one of the highest priorities for a pregnant or postpartum mom.* 1 in 5 women experience anxiety and/or depression from the time of conception through the first year after birth. Postpartum Mood Disorders are the #1 complication of pregnancy and childbirth and the suffering experienced by these mothers is not necessary! These illnesses are temporary and respond well to treatment, which often includes self-care, social support, talk therapy, and medication when needed. Women experiencing anxiety and depression often say things like: "I'm supposed to be happy.why do I feel so sad?", "Why can't I snap out of it?", "I'm having thoughts that scare me." There is no need to be embarrassed if you are feeling these symptoms: Overwhelmed, anxious, angry, sad, guilty, irritable, hopeless, exhausted but can't sleep You are NOT alone. You are NOT to blame. With help, you WILL be well. Where can I find help? Medical professionals such as your OB, midwife, gynecologist, family practitioner, primary care provider, pediatrician, or mental health providers; [facility] support groups: Feelings After Birth, Breastfeeding Support Group, Baby and Me Group, and Fit 4 Two exercise classes. You have permission to ask for help. It will confirm your feelings, validate your experiences,  share/learn coping strategies, and gain support and encouragement as you heal. You are important! BRAINSTORM Make a list of local resources, including resources for mom and for partner. Identify support groups. Identify people to call late at night - include names and contact info. Talk with partner about perinatal mood and anxiety disorders. Talk with your OB, midwife, and doula about baby blues and about perinatal mood and anxiety disorders. Talk with your pediatrician about perinatal mood and anxiety disorders.   Support & Sanity Savers   What do you really need?  Basics In preparing for a new baby, many expectant parents spend hours shopping for baby clothes, decorating the nursery, and deciding which car seat to buy. Yet most don't think much about what the reality of parenting a newborn will be like, and what they need to make it through that. So, here is the advice of experienced parents. We know you'll read this, and think "they're exaggerating, I don't really need that." Just trust us on these, OK? Plan for all of   this, and if it turns out you don't need it, come back and teach us how you did it!  Must-Haves (Once baby's survival needs are met, make sure you attend to your own survival needs!) Sleep An average newborn sleeps 16-18 hours per day, over 6-7 sleep periods, rarely more than three hours at a time. It is normal and healthy for a newborn to wake throughout the night... but really hard on parents!! Naps. Prioritize sleep above any responsibilities like: cleaning house, visiting friends, running errands, etc.  Sleep whenever baby sleeps. If you can't nap, at least have restful times when baby eats. The more rest you get, the more patient you will be, the more emotionally stable, and better at solving problems.  Food You may not have realized it would be difficult to eat when you have a newborn. Yet, when we talk to countless new parents, they say things like "it may be 2:00 pm  when I realize I haven't had breakfast yet." Or "every time we sit down to dinner, baby needs to eat, and my food gets cold, so I don't bother to eat it." Finger food. Before your baby is born, stock up with one months' worth of food that: 1) you can eat with one hand while holding a baby, 2) doesn't need to be prepped, 3) is good hot or cold, 4) doesn't spoil when left out for a few hours, and 5) you like to eat. Think about: nuts, dried fruit, Clif bars, pretzels, jerky, gogurt, baby carrots, apples, bananas, crackers, cheez-n-crackers, string cheese, hot pockets or frozen burritos to microwave, garden burgers and breakfast pastries to put in the toaster, yogurt drinks, etc. Restaurant Menus. Make lists of your favorite restaurants & menu items. When family/friends want to help, you can give specific information without much thought. They can either bring you the food or send gift cards for just the right meals. Freezer Meals.  Take some time to make a few meals to put in the freezer ahead of time.  Easy to freeze meals can be anything such as soup, lasagna, chicken pie, or spaghetti sauce. Set up a Meal Schedule.  Ask friends and family to sign up to bring you meals during the first few weeks of being home. (It can be passed around at baby showers!) You have no idea how helpful this will be until you are in the throes of parenting.  www.takethemameal.com is a great website to check out. Emotional Support Know who to call when you're stressed out. Parenting a newborn is very challenging work. There are times when it totally overwhelms your normal coping abilities. EVERY NEW PARENT NEEDS TO HAVE A PLAN FOR WHO TO CALL WHEN THEY JUST CAN'T COPE ANY MORE. (And it has to be someone other than the baby's other parent!) Before your baby is born, come up with at least one person you can call for support - write their phone number down and post it on the refrigerator. Anxiety & Sadness. Baby blues are normal after  pregnancy; however, there are more severe types of anxiety & sadness which can occur and should not be ignored.  They are always treatable, but you have to take the first step by reaching out for help. [facility] offers a "Mom Talk" group which meets every Tuesday from 10 am - 11 am.  This group is for new moms who need support and connection after their babies are born.  Call [phone].  Really, Really Helpful (Plan for them!   Make sure these happen often!!) Physical Support with Taking Care of Yourselves Asking friends and family. Before your baby is born, set up a schedule of people who can come and visit and help out (or ask a friend to schedule for you). Any time someone says "let me know what I can do to help," sign them up for a day. When they get there, their job is not to take care of the baby (that's your job and your joy). Their job is to take care of you!  Postpartum doulas. If you don't have anyone you can call on for support, look into postpartum doulas:  professionals at helping parents with caring for baby, caring for themselves, getting breastfeeding started, and helping with household tasks. www.padanc.org is a helpful website for learning about doulas in our area. Peer Support / Parent Groups Why: One of the greatest ideas for new parents is to be around other new parents. Parent groups give you a chance to share and listen to others who are going through the same season of life, get a sense of what is normal infant development by watching several babies learn and grow, share your stories of triumph and struggles with empathetic ears, and forgive your own mistakes when you realize all parents are learning by trial and error. Where to find: There are many places you can meet other new parents throughout our community.  [facility] offers the following classes for new moms and their little ones:  Baby and Me (Birth to Crawling) and Breastfeeding Support Group. Go to  www.conehealthybaby.com or call [phone] for more information. Time for your Relationship It's easy to get so caught up in meeting baby's immediate needs that it's hard to find time to connect with your partner, and meet the needs of your relationship. It's also easy to forget what "quality time with your partner" actually looks like. If you take your baby on a date, you'd be amazed how much of your couple time is spent feeding the baby, diapering the baby, admiring the baby, and talking about the baby. Dating: Try to take time for just the two of you. Babysitter tip: Sometimes when moms are breastfeeding a newborn, they find it hard to figure out how to schedule outings around baby's unpredictable feeding schedules. Have the babysitter come for a three hour period. When she comes over, if baby has just eaten, you can leave right away, and come back in two hours. If baby hasn't fed recently, you start the date at home. Once baby gets hungry and gets a good feeding in, you can head out for the rest of your date time. Date Nights at Home: If you can't get out, at least set aside one evening a week to prioritize your relationship: whenever baby dozes off or doesn't have any immediate needs, spend a little time focusing on each other. Potential conflicts: The main relationship conflicts that come up for new parents are: issues related to sexuality, financial stresses, a feeling of an unfair division of household tasks, and conflicts in parenting styles. The more you can work on these issues before baby arrives, the better!  Fun and Frills (Don't forget these. and don't feel guilty for indulging in them!) Everyone has something in life that is a fun little treat that they do just for themselves. It may be: reading the morning paper, or going for a daily jog, or having coffee with a friend once a week, or going to a movie on Friday nights,   or fine chocolates, or bubble baths, or curling up with a good  book. Unless you do fun things for yourself every now and then, it's hard to have the energy for fun with your baby. Whatever your "special" treats are, make sure you find a way to continue to indulge in them after your baby is born. These special moments can recharge you, and allow you to return to baby with a new joy   PERINATAL MOOD DISORDERS: MATERNAL MENTAL HEALTH FROM CONCEPTION THROUGH THE POSTPARTUM PERIOD   _________________________________________Emergency and Crisis Resources If you are an imminent risk to self or others, are experiencing intense personal distress, and/or have noticed significant changes in activities of daily living, call:  911 [facility]: [phone]  [address], [place], [place], [address] Mobile Crisis: 877-626-1772 National Suicide Hotline: 988 Or visit the following crisis centers: Local Emergency Departments [facility]: [address], [place]  [phone]. Hours: 8:30AM-5PM. Insurance Accepted: Medicaid, Medicare, and Uninsured.  [facility]:  [address], [place]  Mon-Friday 8am-3pm, [phone]                                                                                  ___________ Non-Crisis Resources To identify specific providers that are covered by your insurance, contact your insurance company or local agencies:  [facility]: 1-800-256-2452 [facility]: 888-581-9988 [employer]: 1-800-939-5911 Postpartum Support International- Warm-line: 1-800-944-4773                                                      __Outpatient Therapy and Medication Management   Providers:  [facility]: [phone] Hours: 9AM-5PM  Insurance Accepted: AARP, Aetna, BCBS, Cigna, [employer], Humana, Medicare  [facility] [phone] Hours: 8AM-5:30PM  nsurance Accepted: All insurances EXCEPT AARP, Aetna,  [employer], and Humana [facility]: [phone] Hours: 8AM-8PM Insurance Accepted: Aetna, BCBS, Cigna, [employer], Medicaid, Medicare, Uninsured [facility]: [phone] [facility]: [phone] Hours: 8:30AM-7PM Insurance Accepted: Aetna, BCBS, Medicaid, Medicare, Tricare, United Healthcare [facility]:  [phone]   Hours:9AM-5PM Insurance Accepted:  Aetna, BCBS, [facility], Medicaid, United Health Care  [facility]: [phone] Hours: 9AM-5:30PM Insurance Accepted: AARP, Aetna, BCBS, Cigna, and Medicaid, Medicare, United Health Care [facility]:  [phone] Hours: 9am-5pm Insurance Accepted: BCBS; they do not accept Medicaid/Medicare [facility]: [phone] Hours: 9am-9pm Insurance Accepted: All major insurance including Medicaid and Medicare [facility]: [phone] Hours: 9AM- 5PM Insurance Accepted: All insurances EXCEPT Medicaid and Medicare. [facility]: [phone]   ____________                                                                       Parenting Support Groups [facility]: [phone] [facility]:  [phone] Family Support Network: (support for children in the NICU and/or with special needs), [phone]   ___________                                                                 Mental Health Support Groups Mental Health Association: [phone]    _____________                                                                                  Online Resources Postpartum Support International: http://www.postpartum.net/  800-944-4PPD 2Moms Supporting Moms:  www.momssupportingmoms.net    

## 2022-07-31 ENCOUNTER — Ambulatory Visit (INDEPENDENT_AMBULATORY_CARE_PROVIDER_SITE_OTHER): Payer: Commercial Managed Care - HMO | Admitting: Family Medicine

## 2022-07-31 VITALS — BP 106/68 | HR 91 | Wt 251.0 lb

## 2022-07-31 DIAGNOSIS — O09299 Supervision of pregnancy with other poor reproductive or obstetric history, unspecified trimester: Secondary | ICD-10-CM

## 2022-07-31 DIAGNOSIS — Z8632 Personal history of gestational diabetes: Secondary | ICD-10-CM

## 2022-07-31 DIAGNOSIS — O99213 Obesity complicating pregnancy, third trimester: Secondary | ICD-10-CM

## 2022-07-31 DIAGNOSIS — D509 Iron deficiency anemia, unspecified: Secondary | ICD-10-CM

## 2022-07-31 DIAGNOSIS — O09293 Supervision of pregnancy with other poor reproductive or obstetric history, third trimester: Secondary | ICD-10-CM

## 2022-07-31 DIAGNOSIS — O99210 Obesity complicating pregnancy, unspecified trimester: Secondary | ICD-10-CM

## 2022-07-31 DIAGNOSIS — O0990 Supervision of high risk pregnancy, unspecified, unspecified trimester: Secondary | ICD-10-CM

## 2022-07-31 DIAGNOSIS — O99013 Anemia complicating pregnancy, third trimester: Secondary | ICD-10-CM

## 2022-07-31 DIAGNOSIS — Z3A32 32 weeks gestation of pregnancy: Secondary | ICD-10-CM

## 2022-07-31 DIAGNOSIS — O43193 Other malformation of placenta, third trimester: Secondary | ICD-10-CM

## 2022-07-31 DIAGNOSIS — O43199 Other malformation of placenta, unspecified trimester: Secondary | ICD-10-CM

## 2022-07-31 DIAGNOSIS — Z903 Acquired absence of stomach [part of]: Secondary | ICD-10-CM

## 2022-07-31 NOTE — Progress Notes (Signed)
   PRENATAL VISIT NOTE  Subjective:  Nancy Garrett is a 32 y.o. G2P1001 at 61w2dbeing seen today for ongoing prenatal care.  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  Patient reports  tired, hips sore .  Contractions: Not present. Vag. Bleeding: None.  Movement: Present. Denies leaking of fluid.   The following portions of the patient's history were reviewed and updated as appropriate: allergies, current medications, past family history, past medical history, past social history, past surgical history and problem list.   Objective:   Vitals:   07/31/22 1012  BP: 106/68  Pulse: 91  Weight: 251 lb (113.9 kg)    Fetal Status: Fetal Heart Rate (bpm): 154   Movement: Present     General:  Alert, oriented and cooperative. Patient is in no acute distress.  Skin: Skin is warm and dry. No rash noted.   Cardiovascular: Normal heart rate noted  Respiratory: Normal respiratory effort, no problems with respiration noted  Abdomen: Soft, gravid, appropriate for gestational age.  Pain/Pressure: Present     Pelvic: Cervical exam deferred        Extremities: Normal range of motion.  Edema: None  Mental Status: Normal mood and affect. Normal behavior. Normal judgment and thought content.   Assessment and Plan:  Pregnancy: G2P1001 at 348w2d. Supervision of high risk pregnancy, antepartum FHT and FH normal  2. H/O gastric  sleeve CBC today  3. Obesity in pregnancy, antepartum  4. History of gestational diabetes Normal GTT  5. Hx of preeclampsia, prior pregnancy, currently pregnant ASA [id number]. Marginal insertion of umbilical cord affecting management of mother Normal growth  7. Maternal iron deficiency anemia complicating pregnancy in second trimester CBC, ferritin today  Preterm labor symptoms and general obstetric precautions including but not limited to vaginal bleeding, contractions, leaking of fluid and fetal movement were reviewed in detail with the patient. Please refer to After Visit Summary for other counseling recommendations.   No follow-ups on file.  Future Appointments  Date Time Provider DepBerrien/29/2024 10:55 AM StiTruett MainlandO CWH-WMHP None  08/13/2022  2:00 PM ChiGenia HaroldD GNA-GNA None  08/17/2022  8:50 AM SchRosemarie AxD [facility]/10/2022 10:45 AM [facility]/11/2022 10:55 AM StiTruett MainlandO C[facility]-WM[facility] None  08/21/2022 11:15 AM [facility] NURSE [facility]/01/2023 11:30 AM [facility] US3 [facility]/13/2024 10:55 AM StiTruett MainlandO CWH-WMHP None  09/02/2022 10:55 AM StiTruett MainlandO CWH-WMHP None  09/09/2022 10:55 AM StiTruett MainlandO CWH-WMHP None  10/26/2022 10:00 AM Ngetich, DinNelda BucksP PSC-PSC None  12/09/2022 10:00 AM Tobb, KarGodfrey PickO [facility] None    JacTruett MainlandO

## 2022-08-01 LAB — CBC
Hematocrit: 31 % — ABNORMAL LOW (ref 34.0–46.6)
Hemoglobin: 10 g/dL — ABNORMAL LOW (ref 11.1–15.9)
MCH: 26.5 pg — ABNORMAL LOW (ref 26.6–33.0)
MCHC: 32.3 g/dL (ref 31.5–35.7)
MCV: 82 fL (ref 79–97)
Platelets: 172 10*3/uL (ref 150–450)
RBC: 3.78 x10E6/uL (ref 3.77–5.28)
RDW: 13 % (ref 11.7–15.4)
WBC: 9.1 10*3/uL (ref 3.4–10.8)

## 2022-08-01 LAB — FERRITIN: Ferritin: 11 ng/mL — ABNORMAL LOW (ref 15–150)

## 2022-08-04 ENCOUNTER — Encounter (HOSPITAL_COMMUNITY): Payer: Self-pay | Admitting: Obstetrics and Gynecology

## 2022-08-04 ENCOUNTER — Inpatient Hospital Stay (HOSPITAL_COMMUNITY)
Admission: AD | Admit: 2022-08-04 | Discharge: 2022-08-04 | Disposition: A | Discharge status: Home / Self Care | Payer: Commercial Managed Care - HMO | Attending: Obstetrics and Gynecology | Admitting: Obstetrics and Gynecology

## 2022-08-04 ENCOUNTER — Inpatient Hospital Stay (HOSPITAL_COMMUNITY): Payer: Commercial Managed Care - HMO

## 2022-08-04 DIAGNOSIS — O99513 Diseases of the respiratory system complicating pregnancy, third trimester: Secondary | ICD-10-CM | POA: Diagnosis not present

## 2022-08-04 DIAGNOSIS — O0993 Supervision of high risk pregnancy, unspecified, third trimester: Secondary | ICD-10-CM | POA: Insufficient documentation

## 2022-08-04 DIAGNOSIS — U071 COVID-19: Secondary | ICD-10-CM | POA: Insufficient documentation

## 2022-08-04 DIAGNOSIS — R519 Headache, unspecified: Secondary | ICD-10-CM

## 2022-08-04 DIAGNOSIS — J988 Other specified respiratory disorders: Secondary | ICD-10-CM | POA: Diagnosis not present

## 2022-08-04 DIAGNOSIS — R0981 Nasal congestion: Secondary | ICD-10-CM

## 2022-08-04 DIAGNOSIS — O26893 Other specified pregnancy related conditions, third trimester: Secondary | ICD-10-CM | POA: Insufficient documentation

## 2022-08-04 DIAGNOSIS — R051 Acute cough: Secondary | ICD-10-CM

## 2022-08-04 DIAGNOSIS — O0990 Supervision of high risk pregnancy, unspecified, unspecified trimester: Secondary | ICD-10-CM

## 2022-08-04 DIAGNOSIS — Z3A32 32 weeks gestation of pregnancy: Secondary | ICD-10-CM

## 2022-08-04 DIAGNOSIS — O98513 Other viral diseases complicating pregnancy, third trimester: Secondary | ICD-10-CM | POA: Diagnosis present

## 2022-08-04 DIAGNOSIS — J069 Acute upper respiratory infection, unspecified: Secondary | ICD-10-CM

## 2022-08-04 DIAGNOSIS — O99210 Obesity complicating pregnancy, unspecified trimester: Secondary | ICD-10-CM | POA: Diagnosis present

## 2022-08-04 LAB — RESP PANEL BY RT-PCR (RSV, FLU A&B, COVID)  RVPGX2
Influenza A by PCR: NEGATIVE
Influenza B by PCR: NEGATIVE
Resp Syncytial Virus by PCR: NEGATIVE
SARS Coronavirus 2 by RT PCR: POSITIVE — AB

## 2022-08-04 MED ORDER — PAXLOVID (300/100) 20 X 150 MG & 10 X 100MG PO TBPK: 3.0000 | ORAL_TABLET | Freq: Two times a day (BID) | ORAL | 0 refills | Status: AC

## 2022-08-04 NOTE — MAU Provider Note (Signed)
History     CSN: [id number]  Arrival date and time: 08/04/22 1840   Event Date/Time   First Provider Initiated Contact with Patient 08/04/22 1957      Chief Complaint  Patient presents with   Fever   Shortness of Breath   Nasal Congestion   31 y.o. G2P1001 @32$ .6 wks presenting with congestion, cough, and feeling feverish. Reports onset of nasal congestion about 3 weeks ago. Reports green or yellow nasal drainage. She has been using Mucinex for it without improvement. Cough started about 2 days ago and is productive. Reports some SOB and chest heaviness. Has felt intermittently hot but not checked temp. Reports good FM and no pregnancy complaints.     OB History     Gravida  2   Para  1   Term  1   Preterm      AB      Living  1      SAB      IAB      Ectopic      Multiple      Live Births  1           Past Medical History:  Diagnosis Date   Chronic abdominal pain    Head ache     Past Surgical History:  Procedure Laterality Date   GASTRECTOMY  2019   Dr. Jeb Levering.   LAPAROSCOPIC GASTRIC SLEEVE RESECTION     TONSILLECTOMY      Family History  Problem Relation Age of Onset   High blood pressure Mother    Diabetes Father    Diabetes Sister    Stroke Sister    High blood pressure Sister     Social History   Tobacco Use   Smoking status: Never   Smokeless tobacco: Never  Vaping Use   Vaping Use: Never used  Substance Use Topics   Alcohol use: Not Currently    Alcohol/week: 2.0 standard drinks of alcohol    Types: 2 Glasses of wine per week   Drug use: Never    Allergies:  Allergies  Allergen Reactions   Iodinated Contrast Media    Ivp Dye [Iodinated Contrast Media] Nausea And Vomiting    Medications Prior to Admission  Medication Sig Dispense Refill Last Dose   aspirin EC 81 MG tablet Take 1 tablet (81 mg total) by mouth daily. Swallow whole. 90 tablet 3 08/03/2022   ferrous sulfate (FERROUSUL) 325 (65 FE) MG tablet  Take 1 tablet (325 mg total) by mouth every other day. 30 tablet 3 08/04/2022   Prenatal Vit-Fe Fumarate-FA (MULTIVITAMIN-PRENATAL) 27-0.8 MG TABS tablet Take 1 tablet by mouth daily at 12 noon.   08/04/2022   propranolol (INDERAL) 10 MG tablet Take 1 tablet (10 mg total) by mouth at bedtime. 90 tablet 1 08/03/2022    Review of Systems  Constitutional:  Positive for fever. Negative for chills.  HENT:  Positive for congestion, rhinorrhea and sneezing. Negative for sore throat.   Respiratory:  Positive for chest tightness (pressure) and shortness of breath.   Cardiovascular:  Negative for chest pain.  Gastrointestinal:  Negative for abdominal pain.  Genitourinary:  Negative for vaginal bleeding.   Physical Exam   Blood pressure 107/70, pulse (!) 116, temperature 98.4 F (36.9 C), resp. rate 17, height 5' 6"$  (1.676 m), weight 93.4 kg, last menstrual period 12/17/2021, SpO2 99 %.  Physical Exam Vitals and nursing note reviewed.  Constitutional:      General: She is  not in acute distress.    Appearance: Normal appearance.  HENT:     Head: Normocephalic and atraumatic.     Nose: Nose normal.     Right Sinus: No maxillary sinus tenderness or frontal sinus tenderness.     Left Sinus: No maxillary sinus tenderness or frontal sinus tenderness.     Mouth/Throat:     Mouth: Mucous membranes are moist.     Palate: No lesions.     Pharynx: Oropharynx is clear. Uvula midline. No pharyngeal swelling, oropharyngeal exudate, posterior oropharyngeal erythema or uvula swelling.     Tonsils: No tonsillar exudate or tonsillar abscesses.  Cardiovascular:     Rate and Rhythm: Regular rhythm. Tachycardia present.     Heart sounds: Normal heart sounds.  Pulmonary:     Effort: Pulmonary effort is normal. No respiratory distress.     Breath sounds: Normal breath sounds. No stridor. No wheezing, rhonchi or rales.  Musculoskeletal:        General: Normal range of motion.     Cervical back: Normal range of  motion.  Neurological:     General: No focal deficit present.     Mental Status: She is alert and oriented to person, place, and time.  Psychiatric:        Mood and Affect: Mood normal.        Behavior: Behavior normal.   EFM: 140 bpm, mod variability, + accels, no decels Toco: none  No results found for this or any previous visit (from the past 24 hour(s)).  DG Chest Portable 1 View  Result Date: 08/04/2022 CLINICAL DATA:  Shortness of breath and cough EXAM: PORTABLE CHEST 1 VIEW COMPARISON:  05/13/2022 FINDINGS: The heart size and mediastinal contours are within normal limits. Both lungs are clear. The visualized skeletal structures are unremarkable. IMPRESSION: No active disease. Electronically Signed   By: Ulyses Jarred M.D.   On: 08/04/2022 20:28    MAU Course  Procedures  MDM Labs and imaging ordered. Transfer of care given to Dr. Karen Kays, [place], [facility]  08/04/2022 8:57 PM    Assessment and Plan

## 2022-08-04 NOTE — BH Specialist Note (Signed)
Integrated Behavioral Health via Telemedicine Visit  08/18/2022 Nancy Garrett [id number]  Number of Integrated Behavioral Health Clinician visits: Additional Visit  Session Start time: [id number]   Session End time: [id number]  Total time in minutes: 89   Referring Provider: Gavin Garrett, CNM Patient/Family location: Home [facility] Provider location: [facility] for [place] [facility]  All persons participating in visit: [name] and Nancy Garrett   Types of Service: Individual psychotherapy and Video visit  I connected with Nancy Garrett and/or Nancy Garrett's  n/a  via  Telephone or Video Enabled Telemedicine Application  (Video is Caregility application) and verified that I am speaking with the correct person using two identifiers. Discussed confidentiality: Yes   I discussed the limitations of telemedicine and the availability of in person appointments.  Discussed there is a possibility of technology failure and discussed alternative modes of communication if that failure occurs.  I discussed that engaging in this telemedicine visit, they consent to the provision of behavioral healthcare and the services will be billed under their insurance.  Patient and/or legal guardian expressed understanding and consented to Telemedicine visit: Yes   Presenting Concerns: Patient and/or family reports the following symptoms/concerns: Processing grieving loss of step-father over one week ago, and extended family conflict after the loss. Duration of problem: Over one week  Patient and/or Family's Strengths/Protective Factors: Social connections, Concrete supports in place (healthy food, safe environments, etc.), Sense of purpose, and Physical Health (exercise, healthy diet, medication compliance, etc.)  Goals Addressed: Patient will:   Demonstrate ability to: Begin healthy grieving over loss  Progress towards  Goals: Ongoing  Interventions: Interventions utilized:  Supportive Reflection Standardized Assessments completed: Not Needed  Patient and/or Family Response: Patient agrees with treatment plan.   Assessment: Patient currently experiencing Grief.   Patient may benefit from psychoeducation and brief therapeutic interventions regarding coping with grief .  Plan: Follow up with behavioral health clinician on : Two weeks Behavioral recommendations:  -Continue allowing time and space to grieve loss with supportive family members -Continue plan to celebrate with family and friends at baby shower on Saturday -Continue plan to limit work to one client/day and moving positions often, as recommended by medical provider Referral(s): [place] (In Clinic)  I discussed the assessment and treatment plan with the patient and/or parent/guardian. They were provided an opportunity to ask questions and all were answered. They agreed with the plan and demonstrated an understanding of the instructions.   They were advised to call back or seek an in-person evaluation if the symptoms worsen or if the condition fails to improve as anticipated.  Nancy Fair, LCSW     06/17/2022    9:27 AM 03/09/2022    9:36 AM 02/24/2022   10:22 AM  Depression screen PHQ 2/9  Decreased Interest 0 0 0  Down, Depressed, Hopeless 0 0 0  PHQ - 2 Score 0 0 0  Altered sleeping '1 3 3  '$ Tired, decreased energy 0 2 3  Change in appetite 0 0 3  Feeling bad or failure about yourself  0 0 0  Trouble concentrating 0 1 1  Moving slowly or fidgety/restless 0 0 2  Suicidal thoughts 0 0 0  PHQ-9 Score '1 6 12      '$ 06/17/2022    9:27 AM 06/16/2022    9:56 AM 03/09/2022    9:36 AM 02/24/2022   10:23 AM  GAD 7 : Generalized Anxiety Score  Nervous, Anxious,  on Edge 1 1 0 0  Control/stop worrying 0 2 0 0  Worry too much - different things 0 0 0 0  Trouble relaxing 1 1 0 3  Restless 0 0 0 3  Easily  annoyed or irritable 1 1 0 1  Afraid - awful might happen 0 1 0 1  Total GAD 7 Score 3 6 0 8

## 2022-08-04 NOTE — MAU Note (Addendum)
.  Nancy Garrett is a 32 y.o. at 31w6dhere in MAU reporting feeling lightheaded, head and nasal congestion, intermittent fevers for couple months. States yesterday started with a cough. States she has nasal drainage which makes her throat sore. She "knows" it's a sinus infection which she gets. Usually Zithromax helps but was told she could not take it due to pregnancy. Very tired. Headaches off and on. States no pain right now but her body is sore. Good FM. Denies LOF or VB. Did home covid test and was negative  Onset of complaint: 236month  Pain score: 0 Vitals:   08/04/22 1905 08/04/22 1911  BP:  125/73  Pulse: (!) 121   Resp: 17   Temp: 98.4 F (36.9 C)   SpO2: 99%      FHT:168 Lab orders placed from triage:  u/a

## 2022-08-13 ENCOUNTER — Encounter: Payer: Self-pay | Admitting: Psychiatry

## 2022-08-13 ENCOUNTER — Ambulatory Visit (INDEPENDENT_AMBULATORY_CARE_PROVIDER_SITE_OTHER): Payer: Commercial Managed Care - HMO | Admitting: Family Medicine

## 2022-08-13 ENCOUNTER — Ambulatory Visit (INDEPENDENT_AMBULATORY_CARE_PROVIDER_SITE_OTHER): Payer: Commercial Managed Care - HMO | Admitting: Psychiatry

## 2022-08-13 VITALS — BP 113/75 | HR 86 | Ht 66.0 in | Wt 248.0 lb

## 2022-08-13 VITALS — BP 106/85 | HR 124 | Wt 249.0 lb

## 2022-08-13 DIAGNOSIS — O0990 Supervision of high risk pregnancy, unspecified, unspecified trimester: Secondary | ICD-10-CM

## 2022-08-13 DIAGNOSIS — O09293 Supervision of pregnancy with other poor reproductive or obstetric history, third trimester: Secondary | ICD-10-CM

## 2022-08-13 DIAGNOSIS — G43709 Chronic migraine without aura, not intractable, without status migrainosus: Secondary | ICD-10-CM | POA: Diagnosis not present

## 2022-08-13 DIAGNOSIS — Z3A34 34 weeks gestation of pregnancy: Secondary | ICD-10-CM

## 2022-08-13 DIAGNOSIS — R519 Headache, unspecified: Secondary | ICD-10-CM

## 2022-08-13 DIAGNOSIS — O09299 Supervision of pregnancy with other poor reproductive or obstetric history, unspecified trimester: Secondary | ICD-10-CM

## 2022-08-13 DIAGNOSIS — O99213 Obesity complicating pregnancy, third trimester: Secondary | ICD-10-CM

## 2022-08-13 DIAGNOSIS — O99210 Obesity complicating pregnancy, unspecified trimester: Secondary | ICD-10-CM

## 2022-08-13 DIAGNOSIS — R3989 Other symptoms and signs involving the genitourinary system: Secondary | ICD-10-CM

## 2022-08-13 DIAGNOSIS — R319 Hematuria, unspecified: Secondary | ICD-10-CM

## 2022-08-13 LAB — POCT URINALYSIS DIPSTICK
Glucose, UA: NEGATIVE
Leukocytes, UA: NEGATIVE
Protein, UA: NEGATIVE
Spec Grav, UA: <=1.005 — AB (ref 1.010–1.025)
Urobilinogen, UA: 1 E.U./dL
pH, UA: 5 (ref 5.0–8.0)

## 2022-08-13 NOTE — Progress Notes (Signed)
   CC:  headaches  Follow-up Visit  Last visit: 04/08/22  Brief HPI: 32 year old female with a history of migraines, IIH, s/p gastric bypass who follows in clinic for headaches and blurred vision during pregnancy.  At her last visit MRI/MRV were ordered. She was recommended to follow up with her eye doctor. Was started on magnesium for headache prevention.  Interval History: Headaches have improved since her last visit, but she continues to get 3-4 headaches per week. She is taking magnesium daily which seems to help. Takes Tylenol as needed which is only somewhat helpful. Tried benadryl as needed which helped her sleep but did not resolve the headache. She has not had any more issues with blurred vision. Saw her eye doctor who noted a normal eye exam.  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 MRI with contrast was recommended for better clarification. MRV was normal.  Migraine days per month: 16 Headache free days per month: 14  Current Headache Regimen: Preventative: Magnesium Abortive: Tylenol   Prior Therapies                                  Topamax 100 mg daily Diamox 125 mg TID Tylenol - lack of efficacy Imitrex 50 mg PRN  Physical Exam:   Vital Signs: BP 113/75   Pulse 86   Ht 5' 6"$  (1.676 m)   Wt 248 lb (112.5 kg)   LMP 12/17/2021   BMI 40.03 kg/m  GENERAL:  well appearing, in no acute distress, alert  SKIN:  Color, texture, turgor normal. No rashes or lesions HEAD:  Normocephalic/atraumatic. RESP: normal respiratory effort MSK:  No gross joint deformities.   NEUROLOGICAL: Mental Status: Alert, oriented to person, place and time, Follows commands, and Speech fluent and appropriate. Cranial Nerves: PERRL, face symmetric, no dysarthria, hearing grossly intact Motor: moves all extremities equally Gait: normal-based.  IMPRESSION: 32 year old female with a history of  migraines, IIH, s/p gastric bypass, who is currently [redacted] weeks pregnant who presents for follow up of migraines. MRV was normal. 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 Will plan to repeat MRI with contrast to better clarify this finding once she has delivered her baby. She has had some improvement in migraine frequency with daily magnesium. Will add riboflavin daily.   PLAN: -Prevention: Continue magnesium, add riboflavin 200 mg BID -Will plan to repeat MRI with contrast after she has delivered her baby -Next steps: consider cyproheptadine for migraine prevention  Follow-up: 8 months, or sooner if needed  I spent a total of 29 minutes on the date of the service. Headache education was done. Discussed treatment options including preventive and acute medications, and natural supplements. Discussed medication side effects, adverse reactions and drug interactions. Written educational materials and patient instructions outlining all of the above were given.  Genia Harold, MD 08/13/22 2:33 PM

## 2022-08-13 NOTE — Patient Instructions (Addendum)
You can take vitamin B2 200 mg twice a day for headache prevention

## 2022-08-13 NOTE — Progress Notes (Signed)
   PRENATAL VISIT NOTE  Subjective:  Nancy Garrett is a 32 y.o. G2P1001 at 20w1dbeing seen today for ongoing prenatal care.  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  Patient reports  had COVID last week - symptoms started Sunday, is now 11 days post diagnosis. Still urine is concentrated. Eating ok and trying to drink as much fluid as possible - difficult with gastric sleeve .  Contractions: Irritability. Vag. Bleeding: None.  Movement: Present. Denies leaking of fluid.   The following portions of the patient's history were reviewed and updated as appropriate: allergies, current medications, past family history, past medical history, past social history, past surgical history and problem list.   Objective:   Vitals:   08/13/22 1103  BP: 106/85  Pulse: (!) 124  Weight: 249 lb (112.9 kg)    Fetal Status: Fetal Heart Rate (bpm): 135   Movement: Present     General:  Alert, oriented and cooperative. Patient is in no acute distress.  Skin: Skin is warm and dry. No rash noted.   Cardiovascular: Normal heart rate noted  Respiratory: Normal respiratory effort, no problems with respiration noted  Abdomen: Soft, gravid, appropriate for gestational age.  Pain/Pressure: Present     Pelvic: Cervical exam deferred        Extremities: Normal range of motion.  Edema: None  Mental Status: Normal mood and affect. Normal  behavior. Normal judgment and thought content.   Assessment and Plan:  Pregnancy: G2P1001 at 363w1d. Hx of preeclampsia, prior pregnancy, currently pregnant ASA 8171m2. Obesity in pregnancy, antepartum  3. Supervision of high risk pregnancy, antepartum FHT and FH normal Taking iron - recommended taking it with vitamin C to improve absorption, especially with gastric sleeve.  4. [redacted] weeks gestation of pregnancy  5. Worsening headaches  6. Hematuria, unspecified type - Urine Culture  7. Dark yellow-colored urine - POCT Urinalysis Dipstick  Preterm labor symptoms and general obstetric precautions including but not limited to vaginal bleeding, contractions, leaking of fluid and fetal movement were reviewed in detail with the patient. Please refer to After Visit Summary for other counseling recommendations.   No follow-ups on file.  Future Appointments  Date Time Provider DepBlo[place]/29/2024  2:00 PM ChiGenia HaroldD GNA-GNA None  08/17/2022  8:50 AM SchRosemarie AxD [facility]/10/2022 10:45 AM [facility]/11/2022 10:55 AM StiTruett MainlandO CWH-WMHP None  08/21/2022 11:15 AM WMC-MFC NURSE [facility]/01/2023 11:30 AM WMC-MFC US3 [facility]MFCUS [facility]/13/2024 10:55 AM StiTruett MainlandO CWH-WMHP None  09/02/2022 10:55 AM StiTruett MainlandO CWH-WMHP None  09/09/2022 10:55 AM StiTruett MainlandO CWH-WMHP None  10/26/2022 10:00 AM Ngetich, DinNelda BucksP PSC-PSC None  12/09/2022 10:00 AM Tobb, KarGodfrey PickO CVD-NORTHLIN None    JacTruett MainlandO

## 2022-08-16 LAB — URINE CULTURE

## 2022-08-17 ENCOUNTER — Encounter: Payer: Self-pay | Admitting: Family Medicine

## 2022-08-17 ENCOUNTER — Ambulatory Visit (INDEPENDENT_AMBULATORY_CARE_PROVIDER_SITE_OTHER): Payer: Commercial Managed Care - HMO | Admitting: Family Medicine

## 2022-08-17 VITALS — BP 94/62 | Ht 64.0 in | Wt 249.0 lb

## 2022-08-17 DIAGNOSIS — M5416 Radiculopathy, lumbar region: Secondary | ICD-10-CM | POA: Diagnosis not present

## 2022-08-17 NOTE — Progress Notes (Signed)
  Alitza Mathson - 32 y.o. female MRN [id number]  Date of birth: [birth year]  SUBJECTIVE:  Including CC & ROS.  No chief complaint on file.   Zeni Migues is a 32 y.o. female that is presenting with worsening of her radicular pain.  Continues to have worsening of the pain in the lower back as well as a going down the left leg.  Seems to be worse the more that she walks.    Review of Systems See HPI   HISTORY: Past Medical, Surgical, Social, and Family History Reviewed & Updated per EMR.   Pertinent Historical Findings include:  Past Medical History:  Diagnosis Date   Chronic abdominal pain    Head ache     Past Surgical History:  Procedure Laterality Date   GASTRECTOMY  2019   Dr. Jeb Levering.   LAPAROSCOPIC GASTRIC SLEEVE RESECTION     TONSILLECTOMY       PHYSICAL EXAM:  VS: BP 94/62 (BP Location: Left Arm, Patient Position: Sitting)   Ht '5\' 4"'$  (1.626 m)   Wt 249 lb (112.9 kg)   LMP 12/17/2021   BMI 42.74 kg/m  Physical Exam Gen: NAD, alert, cooperative with exam, well-appearing MSK:  Neurovascularly intact       ASSESSMENT & PLAN:   Lumbar radiculopathy Acute on chronic in nature.  She did have symptoms prior to pregnancy and her symptoms have gotten worse.  The compression and bracing do help. She does feel more in extension when she wears the brace. Has a history of gastric bypass.  -Counseled on home exercise therapy and supportive care. -Referral to physical therapy.

## 2022-08-17 NOTE — Patient Instructions (Signed)
Good to see you Please use heat as needed  We have made a referral to physical therapy   Please send me a message in MyChart with any questions or updates.  Please see me back in 6-8 weeks.   --Dr. Raeford Razor

## 2022-08-17 NOTE — Assessment & Plan Note (Addendum)
Acute on chronic in nature.  She did have symptoms prior to pregnancy and her symptoms have gotten worse.  The compression and bracing do help. She does feel more in extension when she wears the brace. Has a history of gastric bypass.  -Counseled on home exercise therapy and supportive care. -Referral to physical therapy.

## 2022-08-18 ENCOUNTER — Ambulatory Visit (INDEPENDENT_AMBULATORY_CARE_PROVIDER_SITE_OTHER): Payer: Commercial Managed Care - HMO | Admitting: Clinical

## 2022-08-18 DIAGNOSIS — F4321 Adjustment disorder with depressed mood: Secondary | ICD-10-CM | POA: Diagnosis not present

## 2022-08-19 ENCOUNTER — Ambulatory Visit (INDEPENDENT_AMBULATORY_CARE_PROVIDER_SITE_OTHER): Payer: Commercial Managed Care - HMO | Admitting: Family Medicine

## 2022-08-19 ENCOUNTER — Encounter: Payer: Self-pay | Admitting: General Practice

## 2022-08-19 VITALS — BP 98/73 | HR 108 | Wt 253.0 lb

## 2022-08-19 DIAGNOSIS — O09293 Supervision of pregnancy with other poor reproductive or obstetric history, third trimester: Secondary | ICD-10-CM

## 2022-08-19 DIAGNOSIS — O0993 Supervision of high risk pregnancy, unspecified, third trimester: Secondary | ICD-10-CM | POA: Diagnosis not present

## 2022-08-19 DIAGNOSIS — Z3A35 35 weeks gestation of pregnancy: Secondary | ICD-10-CM

## 2022-08-19 DIAGNOSIS — Z8632 Personal history of gestational diabetes: Secondary | ICD-10-CM

## 2022-08-19 DIAGNOSIS — O99013 Anemia complicating pregnancy, third trimester: Secondary | ICD-10-CM

## 2022-08-19 DIAGNOSIS — R87610 Atypical squamous cells of undetermined significance on cytologic smear of cervix (ASC-US): Secondary | ICD-10-CM

## 2022-08-19 DIAGNOSIS — O43193 Other malformation of placenta, third trimester: Secondary | ICD-10-CM

## 2022-08-19 DIAGNOSIS — D509 Iron deficiency anemia, unspecified: Secondary | ICD-10-CM

## 2022-08-19 DIAGNOSIS — Z903 Acquired absence of stomach [part of]: Secondary | ICD-10-CM

## 2022-08-19 DIAGNOSIS — O43199 Other malformation of placenta, unspecified trimester: Secondary | ICD-10-CM

## 2022-08-19 DIAGNOSIS — O09299 Supervision of pregnancy with other poor reproductive or obstetric history, unspecified trimester: Secondary | ICD-10-CM

## 2022-08-19 DIAGNOSIS — O0990 Supervision of high risk pregnancy, unspecified, unspecified trimester: Secondary | ICD-10-CM

## 2022-08-19 NOTE — Progress Notes (Signed)
   PRENATAL VISIT NOTE  Subjective:  Nancy Garrett is a 32 y.o. G2P1001 at 70w0dbeing seen today for ongoing prenatal care.  She is currently monitored for the following issues for this {Blank single:19197::"high-risk","low-risk"} pregnancy and has Facet degeneration of lumbar region; Obesity in pregnancy, antepartum; H/O gastric sleeve; History of gestational diabetes; Hx of preeclampsia, prior pregnancy, currently pregnant; Supervision of high risk pregnancy, antepartum; ASCUS of cervix with negative high risk HPV; Worsening headaches; Marginal insertion of umbilical cord affecting management of mother; Maternal iron deficiency anemia complicating pregnancy in second trimester; Ulnar neuropathy at elbow of left upper extremity; and Lumbar radiculopathy on their problem list.  Patient reports {sx:[id number]}.  Contractions: Not present. Vag. Bleeding: None.  Movement: Present. Denies leaking of fluid.   The following portions of the patient's history were reviewed and updated as appropriate: allergies, current medications, past family history, past medical history, past social history, past surgical history and problem list.   Objective:   Vitals:   08/19/22 1059  BP: 98/73  Pulse: (!) 108  Weight: 253 lb (114.8 kg)    Fetal Status: Fetal Heart Rate (bpm): 133   Movement: Present     General:  Alert, oriented and cooperative. Patient is in no acute distress.  Skin: Skin is warm and dry. No rash noted.   Cardiovascular: Normal heart rate noted  Respiratory: Normal respiratory effort, no problems with respiration noted  Abdomen: Soft, gravid, appropriate for gestational age.  Pain/Pressure: Present     Pelvic: {Blank single:19197::"Cervical exam performed in the presence of a chaperone","Cervical exam deferred"}        Extremities: Normal range of motion.  Edema: Mild pitting, slight indentation  Mental Status: Normal mood and affect. Normal behavior. Normal judgment and thought content.    Assessment and Plan:  Pregnancy: G2P1001 at 339w0d. [redacted] weeks gestation of pregnancy ***  {Blank single:19197::"Term","Preterm"} labor symptoms and general obstetric precautions including but not limited to vaginal bleeding, contractions, leaking of fluid and fetal movement were reviewed in detail with the patient. Please refer to After Visit Summary for other counseling recommendations.   No follow-ups on file.  Future Appointments  Date Time Provider DeS[place]3/01/2023 11:15 AM WMC-MFC NURSE [facility]MFC [facility]/01/2023 11:30 AM WMC-MFC US3 [facility]/13/2024 10:55 AM StTruett MainlandDO CWH-WMHP None  08/26/2022  1:15 PM ZhDaleen BoPT DWB-REH DWB  09/01/2022 10:15 AM WMC-BEHAVIORAL HEALTH CLINICIAN [facility]MC-CWH [facility]3/20/2024 10:55 AM StTruett MainlandDO CWH-WMHP None  09/09/2022 10:55 AM StTruett MainlandDO CWH-WMHP None  10/12/2022 10:30 AM ScRosemarie AxMD [facility]5/13/2024 10:00 AM Ngetich, DiNelda BucksNP PSC-PSC None  12/09/2022 10:00 AM ToBerniece SalinesDO CVD-[facility]THLIN None  04/14/2023  1:30 PM ChGenia HaroldMD GNA-GNA None    JaTruett MainlandDO

## 2022-08-20 NOTE — BH Specialist Note (Signed)
Integrated Behavioral Health via Telemedicine Visit  09/01/2022 Kelce Rideaux [id number]  Number of Integrated Behavioral Health Clinician visits: Additional Visit  Session Start time: [id number]   Session End time: [id number]  Total time in minutes: 16   Referring Provider: Gavin Pound, CNM Patient/Family location: Work/[place] [facility] Provider location: [facility] [place] [facility]  All persons participating in visit: [name] and [place]   Types of Service: Individual psychotherapy and Video visit  I connected with [name] and/or Lajean Saver Hitt's  n/a  via  Telephone or Video Enabled Telemedicine Application  (Video is Caregility application) and verified that I am speaking with the correct person using two identifiers. Discussed confidentiality: Yes   I discussed the limitations of telemedicine and the availability of in person appointments.  Discussed there is a possibility of technology failure and discussed alternative modes of communication if that failure occurs.  I discussed that engaging in this telemedicine visit, they consent to the provision of behavioral healthcare and the services will be billed under their insurance.  Patient and/or legal guardian expressed understanding and consented to Telemedicine visit: Yes   Presenting Concerns: Patient and/or family reports the following symptoms/concerns: Difficulty walking around/pain in back; scheduled for water aerobics tomorrow to help ease discomfort; goal is to prepare for baby's arrival.  Duration of problem: Current pregnancy; Severity of problem: mild  Patient and/or Family's Strengths/Protective Factors: Social connections, Concrete supports in place (healthy food, safe environments, etc.), Sense of purpose, and Physical Health (exercise, healthy diet, medication compliance, etc.)  Goals Addressed: Patient will:  Reduce symptoms of: stress   Increase  knowledge and/or ability of: healthy habits   Demonstrate ability to: Increase healthy adjustment to current life circumstances  Progress towards Goals: Ongoing  Interventions: Interventions utilized:  Solution-Focused Strategies Standardized Assessments completed: Not Needed  Patient and/or Family Response: Patient agrees with treatment plan.   Assessment: Patient currently experiencing Adjustment disorder with anxious mood and Grief.   Patient may benefit from continued therapeutic interventions.  Plan: Follow up with behavioral health clinician on : Two weeks or postpartum Behavioral recommendations:  -Continue taking prenatal vitamin daily; prioritizing healthy self care daily -Continue taking only one client/day; moving around as recommended by medical provider -Register for hospital today at [email or web address] site -Continue plan to attend waterbirth class on Thursday this week -Continue allowing mom to offer practical support as needed (this also helps mom as she works through grieving process) Referral(s): [place] (In Clinic)  I discussed the assessment and treatment plan with the patient and/or parent/guardian. They were provided an opportunity to ask questions and all were answered. They agreed with the plan and demonstrated an understanding of the instructions.   They were advised to call back or seek an in-person evaluation if the symptoms worsen or if the condition fails to improve as anticipated.  Caroleen Hamman Matis Monnier, LCSW

## 2022-08-21 ENCOUNTER — Ambulatory Visit: Payer: Commercial Managed Care - HMO | Attending: Maternal & Fetal Medicine

## 2022-08-21 ENCOUNTER — Ambulatory Visit: Payer: Commercial Managed Care - HMO | Admitting: *Deleted

## 2022-08-21 VITALS — BP 100/61 | HR 107

## 2022-08-21 DIAGNOSIS — Z8632 Personal history of gestational diabetes: Secondary | ICD-10-CM | POA: Insufficient documentation

## 2022-08-21 DIAGNOSIS — O99213 Obesity complicating pregnancy, third trimester: Secondary | ICD-10-CM | POA: Insufficient documentation

## 2022-08-21 DIAGNOSIS — O0990 Supervision of high risk pregnancy, unspecified, unspecified trimester: Secondary | ICD-10-CM

## 2022-08-21 DIAGNOSIS — E669 Obesity, unspecified: Secondary | ICD-10-CM

## 2022-08-21 DIAGNOSIS — Z3A35 35 weeks gestation of pregnancy: Secondary | ICD-10-CM

## 2022-08-21 DIAGNOSIS — R519 Headache, unspecified: Secondary | ICD-10-CM | POA: Diagnosis present

## 2022-08-21 DIAGNOSIS — O09293 Supervision of pregnancy with other poor reproductive or obstetric history, third trimester: Secondary | ICD-10-CM | POA: Diagnosis present

## 2022-08-21 DIAGNOSIS — R002 Palpitations: Secondary | ICD-10-CM

## 2022-08-21 DIAGNOSIS — Z903 Acquired absence of stomach [part of]: Secondary | ICD-10-CM | POA: Insufficient documentation

## 2022-08-21 DIAGNOSIS — O43193 Other malformation of placenta, third trimester: Secondary | ICD-10-CM | POA: Diagnosis present

## 2022-08-21 DIAGNOSIS — O99891 Other specified diseases and conditions complicating pregnancy: Secondary | ICD-10-CM | POA: Diagnosis not present

## 2022-08-21 DIAGNOSIS — O99843 Bariatric surgery status complicating pregnancy, third trimester: Secondary | ICD-10-CM | POA: Diagnosis not present

## 2022-08-26 ENCOUNTER — Encounter (HOSPITAL_BASED_OUTPATIENT_CLINIC_OR_DEPARTMENT_OTHER): Payer: Self-pay | Admitting: Physical Therapy

## 2022-08-26 ENCOUNTER — Other Ambulatory Visit: Payer: Self-pay

## 2022-08-26 ENCOUNTER — Ambulatory Visit (HOSPITAL_BASED_OUTPATIENT_CLINIC_OR_DEPARTMENT_OTHER): Payer: Commercial Managed Care - HMO | Attending: Family Medicine | Admitting: Physical Therapy

## 2022-08-26 ENCOUNTER — Ambulatory Visit (INDEPENDENT_AMBULATORY_CARE_PROVIDER_SITE_OTHER): Payer: Commercial Managed Care - HMO | Admitting: Family Medicine

## 2022-08-26 ENCOUNTER — Other Ambulatory Visit (HOSPITAL_COMMUNITY)
Admission: RE | Admit: 2022-08-26 | Discharge: 2022-08-26 | Disposition: A | Discharge status: Home / Self Care | Payer: Commercial Managed Care - HMO | Source: Ambulatory Visit | Attending: Family Medicine | Admitting: Family Medicine

## 2022-08-26 VITALS — BP 109/64 | HR 98 | Wt 254.0 lb

## 2022-08-26 DIAGNOSIS — O09293 Supervision of pregnancy with other poor reproductive or obstetric history, third trimester: Secondary | ICD-10-CM

## 2022-08-26 DIAGNOSIS — M5416 Radiculopathy, lumbar region: Secondary | ICD-10-CM | POA: Insufficient documentation

## 2022-08-26 DIAGNOSIS — O09299 Supervision of pregnancy with other poor reproductive or obstetric history, unspecified trimester: Secondary | ICD-10-CM

## 2022-08-26 DIAGNOSIS — R262 Difficulty in walking, not elsewhere classified: Secondary | ICD-10-CM | POA: Diagnosis not present

## 2022-08-26 DIAGNOSIS — O99891 Other specified diseases and conditions complicating pregnancy: Secondary | ICD-10-CM | POA: Diagnosis not present

## 2022-08-26 DIAGNOSIS — O43193 Other malformation of placenta, third trimester: Secondary | ICD-10-CM

## 2022-08-26 DIAGNOSIS — D509 Iron deficiency anemia, unspecified: Secondary | ICD-10-CM

## 2022-08-26 DIAGNOSIS — R87610 Atypical squamous cells of undetermined significance on cytologic smear of cervix (ASC-US): Secondary | ICD-10-CM

## 2022-08-26 DIAGNOSIS — R519 Headache, unspecified: Secondary | ICD-10-CM

## 2022-08-26 DIAGNOSIS — M6281 Muscle weakness (generalized): Secondary | ICD-10-CM | POA: Insufficient documentation

## 2022-08-26 DIAGNOSIS — O99013 Anemia complicating pregnancy, third trimester: Secondary | ICD-10-CM

## 2022-08-26 DIAGNOSIS — Z3A36 36 weeks gestation of pregnancy: Secondary | ICD-10-CM

## 2022-08-26 DIAGNOSIS — M545 Low back pain, unspecified: Secondary | ICD-10-CM

## 2022-08-26 DIAGNOSIS — Z903 Acquired absence of stomach [part of]: Secondary | ICD-10-CM

## 2022-08-26 DIAGNOSIS — O99012 Anemia complicating pregnancy, second trimester: Secondary | ICD-10-CM

## 2022-08-26 DIAGNOSIS — O0990 Supervision of high risk pregnancy, unspecified, unspecified trimester: Secondary | ICD-10-CM

## 2022-08-26 DIAGNOSIS — O43199 Other malformation of placenta, unspecified trimester: Secondary | ICD-10-CM

## 2022-08-26 DIAGNOSIS — O0993 Supervision of high risk pregnancy, unspecified, third trimester: Secondary | ICD-10-CM

## 2022-08-26 NOTE — Progress Notes (Signed)
   PRENATAL VISIT NOTE  Subjective:  Nancy Garrett is a 32 y.o. G2P1001 at [redacted]w[redacted]d being seen today for ongoing prenatal care.  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  Patient reports no complaints.  Contractions: Not present. Vag. Bleeding: None.  Movement: Present. Denies leaking of fluid.   The following portions of the patient's history were reviewed and updated as appropriate: allergies, current medications, past family history, past medical history, past social history, past surgical history and problem list.   Objective:   Vitals:   08/26/22 1057  BP: 109/64  Pulse: 98  Weight: 254 lb (115.2 kg)    Fetal Status: Fetal Heart Rate (bpm): 145 Fundal Height: 36 cm Movement: Present  Presentation: Vertex  General:  Alert, oriented and cooperative. Patient is in no acute distress.  Skin: Skin is warm and dry. No rash noted.   Cardiovascular: Normal heart rate noted  Respiratory: Normal respiratory effort, no problems with respiration noted  Abdomen: Soft, gravid, appropriate for gestational age.  Pain/Pressure: Present     Pelvic: Cervical exam deferred Dilation: 2 Effacement (%): Thick Station: -3  Extremities: Normal range of motion.  Edema: None  Mental Status: Normal mood and affect. Normal behavior. Normal judgment and thought content.   Assessment and Plan:  Pregnancy: G2P1001 at [redacted]w[redacted]d 1.  Supervision of high risk pregnancy, antepartum FHT and FH normal - Culture, beta strep (group b only) - GC/Chlamydia probe amp ([place])not at [facility]  2. Worsening headaches Sees neurology. 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 Offer induction at 39 weeks - patient to think about this.   3. Marginal insertion of umbilical cord affecting management of mother Normal growth  4. ASCUS of cervix with negative high risk HPV Routine screening.  5. H/O gastric sleeve Appropriate weight gain  6. Hx of preeclampsia, prior pregnancy, currently pregnant Normal BP  7. Maternal iron deficiency anemia complicating pregnancy in second trimester On oral iron.   Term labor symptoms and general obstetric precautions including but not limited to vaginal bleeding, contractions, leaking of fluid and fetal movement were reviewed in detail with the patient. Please refer to After Visit Summary for other counseling recommendations.   No follow-ups on file.  Future Appointments  Date Time Provider [place]  08/26/2022  1:15 PM Daleen Bo, PT [facility]  09/01/2022 10:15 AM [facility]  09/02/2022 10:55 AM Truett Mainland, DO [facility] None  09/09/2022 10:55 AM Truett Mainland, DO [facility] None  09/17/2022  9:55 AM Truett Mainland, DO [facility] None  09/23/2022  2:10 PM Truett Mainland, DO [facility] None  10/12/2022 10:30 AM Rosemarie Ax, MD [facility]  10/26/2022 10:00 AM Ngetich, Nelda Bucks, NP [facility] None  12/09/2022 10:00 AM Berniece Salines, DO [facility] None  04/14/2023  1:30 PM Genia Harold, MD [facility] None    Truett Mainland, DO

## 2022-08-26 NOTE — Therapy (Signed)
OUTPATIENT PHYSICAL THERAPY THORACOLUMBAR EVALUATION   Patient Name: Nancy Garrett MRN: [id number] DOB:06-Feb-1991, 32 y.o., female Today's Date: 08/26/2022  END OF SESSION:  PT End of Session - 08/26/22 1706     Visit Number 1    Number of Visits 18    Date for PT Re-Evaluation 11/24/22    Authorization Type cigna    PT Start Time 1315    PT Stop Time 1400    PT Time Calculation (min) 45 min    Activity Tolerance Patient tolerated treatment well;Patient limited by pain    Behavior During Therapy [facility] for tasks assessed/performed             Past Medical History:  Diagnosis Date   Chronic abdominal pain    Head ache    Past Surgical History:  Procedure Laterality Date   GASTRECTOMY  2019   Dr. Jeb Levering.   LAPAROSCOPIC GASTRIC SLEEVE RESECTION     TONSILLECTOMY     Patient Active Problem List   Diagnosis Date Noted   Ulnar neuropathy at elbow of left upper extremity 07/07/2022   Lumbar radiculopathy 07/07/2022   Maternal iron deficiency anemia complicating pregnancy in second trimester 06/19/2022   Marginal insertion of umbilical cord affecting management of mother 05/20/2022   Worsening headaches 05/05/2022   ASCUS of cervix with negative high risk HPV 03/08/2022   Supervision of high risk pregnancy, antepartum 03/01/2022   Obesity in pregnancy, antepartum 02/28/2022   H/O gastric sleeve 02/28/2022   History of gestational diabetes 02/28/2022   Hx of preeclampsia, prior pregnancy, currently pregnant 02/28/2022   Facet degeneration of lumbar region 10/15/2021    PCP: Ngetich, Nelda Bucks, NP   REFERRING PROVIDER:   Rosemarie Ax, MD    REFERRING DIAG: M54.16 (ICD-10-CM) - Lumbar radiculopathy   Rationale for Evaluation and Treatment: Rehabilitation  THERAPY DIAG:  Pain, lumbar region  Muscle weakness (generalized)  Difficulty walking  ONSET DATE: 2022  SUBJECTIVE:                                                                                                                                                                                            SUBJECTIVE STATEMENT: Pt states that the  PT from previous episode was making it worse. She initially noticed it from working on daugther's and woke up next morning not being able to move. Rquires help with bed transfers due to pain. This was happening prior to pregnancy.  X-ray showed loss of disc height and cartilage. Pt states lifting her legs will be painful and weak to lift. She drives an SUV as well as a low  car. Sometimes requires assistance due to car height. Pt starting having more LE swelling as well. Pt is concered about being able to pick up her child after birth due to the pain. 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 Denies NT   Ptis 9 months pregnant  PERTINENT HISTORY:  Gastric sleeve 2019;   PAIN:  Are you having pain? Yes: NPRS scale: 6/10 Pain location: L/S and sacral pain Pain description: burning/sharp Aggravating factors: rolling over in bed, turning, movement, squatting to bend down Relieving factors: lifting legs, fwd bending   PRECAUTIONS: Other: pregnancy- 3rd Trimester  WEIGHT BEARING RESTRICTIONS: No  FALLS:  Has patient fallen in last 6 months? 0   LIVING ENVIRONMENT: Lives with: lives with their family- daughter and husband help greatly Lives in: House/apartment Stairs: No Has following equipment at home: None   OCCUPATION: Hairdresser   PLOF: Independent   PATIENT GOALS Return to pain-free full function  OBJECTIVE:   DIAGNOSTIC FINDINGS:  IMPRESSION: Very mild degenerative changes at L5-S1.  PATIENT SURVEYS:  FOTO 47  63@ DC 5 pts MCII  SCREENING FOR RED FLAGS: Bowel or bladder incontinence: No Spinal tumors: No Cauda equina syndrome: No Compression fracture: No Abdominal aneurysm: No  COGNITION: Overall cognitive status: Within functional limits for tasks  assessed     SENSATION: WFL  MUSCLE LENGTH: Unable to test into supine due to pt dress as well as supine laying precaution   POSTURE: decreased lumbar lordosis and anterior pelvic tilt  PALPATION: TTP of bilat lumbar paraspinals, QL, upper glute max, and hip rotators  LUMBAR ROM:   AROM eval  Flexion WFL (relief)  Extension 50% p!  Right lateral flexion 75% p!  Left lateral flexion 75 %  Right rotation P! 50%  Left rotation 75% p!   (Blank rows = not tested)  LOWER EXTREMITY ROM:     Active  Right eval Left eval  Hip flexion 100 100  Hip extension    Hip abduction 30 30  Hip adduction    Hip internal rotation 15 20  Hip external rotation 25 35  Knee flexion 125 125  Knee extension 0 0   (Blank rows = not tested)  LOWER EXTREMITY MMT:    MMT Right eval Left eval  Hip flexion 4-/5 p! 4/5  Hip extension    Hip abduction 4/5 4+/5  Hip adduction 4+/5 4+/5  Hip internal rotation    Hip external rotation 4-/5 4/5  Knee flexion 4/5 4/5   (Blank rows = not tested)  LUMBAR SPECIAL TESTS:  Slump test: Negative and FABER test: Positive  FUNCTIONAL TESTS:  Transfers: difficulty with STS without UE   GAIT: Distance walked: 65f Assistive device utilized: None Level of assistance: Complete Independence Comments: compensated trendelenburg; toe out, exaggerated lumbar extension   TODAY'S TREATMENT:  DATE: 08/26/22  Exercises - Seated Flexion Stretch with Swiss Ball  - 2 x daily - 7 x weekly - 2 sets - 10 reps - 5 hold - Pelvic Circles on Swiss Ball  - 2 x daily - 7 x weekly - 2 sets - 10 reps - Pelvic Tilt on Swiss Ball  - 2 x daily - 7 x weekly - 2 sets - 10 reps - Sit to Stand  - 2 x daily - 7 x weekly - 2 sets - 8 reps    PATIENT EDUCATION:  Education details: MOI, diagnosis, prognosis, anatomy, exercise progression, DOMS  expectations, muscle firing,  envelope of function, HEP, POC  Person educated: Patient Education method: Explanation, Demonstration, Tactile cues, Verbal cues, and Handouts Education comprehension: verbalized understanding, returned demonstration, verbal cues required, and tactile cues required  HOME EXERCISE PROGRAM:  Access Code: [email or web address] URL: [email or web address] Date: 08/26/2022 Prepared by: Daleen Bo   PATIENT EDUCATION:  Education details: MOI, diagnosis, prognosis, anatomy, exercise progression, increasing daily walking routine, movement breaks at work/office, HEP, POC   Person educated: Patient Education method: Explanation, Demonstration, Tactile cues, Verbal cues, and Handouts Education comprehension: verbalized understanding, returned demonstration, verbal cues required, and tactile cues required  ASSESSMENT:   CLINICAL IMPRESSION: Patient is a 32 y.o. female who was seen today for physical therapy evaluation and treatment for c/c of chronic LBP.  Patient signs and symptoms consistent with mechanical LBP due to history of lower lumbar facet joint arthropathy.  Patient's pain is highly sensitive and irritable with movement at this time with pain especially with extension based movements. Appears to have flexion preference. Pt is currently 9 months pregnant at this time which will effect POC. However, pt would benefit from aquatic environment to work on gentle lumbopelvic mobility and stability. Pt is very comfortable in aquatic setting. Pt advised that she may benefit from future appts at pelvic floor specialty clinic, especially postpartum.Patient will benefit from continued skilled therapy in order to address functional deficits in order to prepare for potential upcoming surgery.   OBJECTIVE IMPAIRMENTS Abnormal gait, decreased activity tolerance, decreased balance, decreased mobility, difficulty walking, decreased ROM, decreased strength, hypomobility, increased  muscle spasms, impaired flexibility, improper body mechanics, postural dysfunction, and pain.    ACTIVITY LIMITATIONS cleaning, community activity, occupation, yard work, shopping, self care, and exercise.    PERSONAL FACTORS Fitness, Past/current experiences, 3+ co morbidities, and Time since onset of injury/illness/exacerbation are also affecting patient's functional outcome.      REHAB POTENTIAL: Fair   CLINICAL DECISION MAKING: unstable/complicated   EVALUATION COMPLEXITY: Moderate   GOALS:     SHORT TERM GOALS: Target date: 10/07/2022        Pt will become independent with HEP in order to demonstrate synthesis of PT education.    Goal status: INITIAL   2.  Pt will be able to demonstrate ability to squat/lift in order to demonstrate functional improvement in lumbar function for self-care and house hold duties.    Goal status: INITIAL   3.  Pt will score at least 5 pt increase on FOTO to demonstrate functional improvement in MCII and pt perceived function.     Goal status: INITIAL   4.Pt will report at least 2 pt reduction on NPRS scale for pain in order to demonstrate functional improvement with household activity, self care, and ADL.    Goal status: INITIAL     LONG TERM GOALS: Target date: 11/18/2022        Pt  will  become independent with final HEP in order to demonstrate synthesis of PT education.  Goal status: INITIAL   2.  Pt will be able to demonstrate/report ability to walk >10 mins without pain in order to demonstrate functional improvement and tolerance to exercise and community mobility.    Goal status: INITIAL   3.  Pt will be able to demonstrate/report ability to sit/stand/sleep for extended periods of time without pain in order to demonstrate functional improvement and tolerance to static positioning.    Goal status: INITIAL   4.  Pt will score >/= 63 on FOTO to demonstrate improvement in perceived lumbar function.  Goal status: INITIAL      PLAN: PT FREQUENCY: 1-2x/week   PT DURATION: 12 weeks (likely D/C by 10 wks)   PLANNED INTERVENTIONS: Therapeutic exercises, Therapeutic activity, Neuromuscular re-education, Balance training, Gait training, Patient/Family education, Joint manipulation, Joint mobilization, Stair training, Aquatic Therapy, Dry Needling, Electrical stimulation, Spinal manipulation, Spinal mobilization, Cryotherapy, Moist heat, Taping, Vasopneumatic device, Traction, Ultrasound, Ionotophoresis '4mg'$ /ml Dexamethasone, and Manual therapy   PLAN FOR NEXT SESSION: aquatic therapy intro- lumbopelvic mobility and stability, has flexion preference   Daleen Bo, PT 08/26/2022, 5:26 PM

## 2022-08-27 LAB — GC/CHLAMYDIA PROBE AMP (~~LOC~~) NOT AT ARMC
Chlamydia: NEGATIVE
Comment: NEGATIVE
Comment: NORMAL
Neisseria Gonorrhea: NEGATIVE

## 2022-08-30 LAB — CULTURE, BETA STREP (GROUP B ONLY): Strep Gp B Culture: NEGATIVE

## 2022-09-01 ENCOUNTER — Ambulatory Visit (INDEPENDENT_AMBULATORY_CARE_PROVIDER_SITE_OTHER): Payer: Commercial Managed Care - HMO | Admitting: Clinical

## 2022-09-01 DIAGNOSIS — F4321 Adjustment disorder with depressed mood: Secondary | ICD-10-CM

## 2022-09-01 DIAGNOSIS — F4322 Adjustment disorder with anxiety: Secondary | ICD-10-CM

## 2022-09-01 NOTE — Patient Instructions (Signed)
[facility] [place] [address] [place], [place] [address] [phone] (main office) [phone] ([place] Pacella's office)  [email or web address]   

## 2022-09-02 ENCOUNTER — Ambulatory Visit (INDEPENDENT_AMBULATORY_CARE_PROVIDER_SITE_OTHER): Payer: Commercial Managed Care - HMO | Admitting: Family Medicine

## 2022-09-02 ENCOUNTER — Encounter (HOSPITAL_BASED_OUTPATIENT_CLINIC_OR_DEPARTMENT_OTHER): Payer: Self-pay | Admitting: Physical Therapy

## 2022-09-02 ENCOUNTER — Ambulatory Visit (HOSPITAL_BASED_OUTPATIENT_CLINIC_OR_DEPARTMENT_OTHER): Payer: Commercial Managed Care - HMO | Admitting: Physical Therapy

## 2022-09-02 VITALS — BP 117/74 | HR 100 | Wt 251.0 lb

## 2022-09-02 DIAGNOSIS — R262 Difficulty in walking, not elsewhere classified: Secondary | ICD-10-CM

## 2022-09-02 DIAGNOSIS — O0990 Supervision of high risk pregnancy, unspecified, unspecified trimester: Secondary | ICD-10-CM

## 2022-09-02 DIAGNOSIS — O09293 Supervision of pregnancy with other poor reproductive or obstetric history, third trimester: Secondary | ICD-10-CM

## 2022-09-02 DIAGNOSIS — M545 Low back pain, unspecified: Secondary | ICD-10-CM

## 2022-09-02 DIAGNOSIS — O09299 Supervision of pregnancy with other poor reproductive or obstetric history, unspecified trimester: Secondary | ICD-10-CM

## 2022-09-02 DIAGNOSIS — R519 Headache, unspecified: Secondary | ICD-10-CM

## 2022-09-02 DIAGNOSIS — O43193 Other malformation of placenta, third trimester: Secondary | ICD-10-CM

## 2022-09-02 DIAGNOSIS — R87610 Atypical squamous cells of undetermined significance on cytologic smear of cervix (ASC-US): Secondary | ICD-10-CM

## 2022-09-02 DIAGNOSIS — Z903 Acquired absence of stomach [part of]: Secondary | ICD-10-CM

## 2022-09-02 DIAGNOSIS — Z3A37 37 weeks gestation of pregnancy: Secondary | ICD-10-CM

## 2022-09-02 DIAGNOSIS — O99891 Other specified diseases and conditions complicating pregnancy: Secondary | ICD-10-CM | POA: Diagnosis not present

## 2022-09-02 DIAGNOSIS — M6281 Muscle weakness (generalized): Secondary | ICD-10-CM

## 2022-09-02 DIAGNOSIS — O43199 Other malformation of placenta, unspecified trimester: Secondary | ICD-10-CM

## 2022-09-02 NOTE — Progress Notes (Signed)
   PRENATAL VISIT NOTE  Subjective:  Nancy Garrett is a 32 y.o. G2P1001 at [redacted]w[redacted]d being seen today for ongoing prenatal care.  She is currently monitored for the following issues for this low-risk pregnancy and has Facet degeneration of lumbar region; Obesity in pregnancy, antepartum; H/O gastric sleeve; History of gestational diabetes; Hx of preeclampsia, prior pregnancy, currently pregnant; Supervision of high risk pregnancy, antepartum; ASCUS of cervix with negative high risk HPV; Worsening headaches; Marginal insertion of umbilical cord affecting management of mother; Maternal iron deficiency anemia complicating pregnancy in second trimester; Ulnar neuropathy at elbow of left upper extremity; and Lumbar radiculopathy on their problem list.  Patient reports occasional contractions.  Contractions: Not present. Vag. Bleeding: None.  Movement: Present. Denies leaking of fluid.   The following portions of the patient's history were reviewed and updated as appropriate: allergies, current medications, past family history, past medical history, past social history, past surgical history and problem list.   Objective:   Vitals:   09/02/22 1045  BP: 117/74  Pulse: 100  Weight: 251 lb (113.9 kg)    Fetal Status: Fetal Heart Rate (bpm): 145 Fundal Height: 37 cm Movement: Present  Presentation: Vertex  General:  Alert, oriented and cooperative. Patient is in no acute distress.  Skin: Skin is warm and dry. No rash noted.   Cardiovascular: Normal heart rate noted  Respiratory: Normal respiratory effort, no problems with respiration noted  Abdomen: Soft, gravid, appropriate for gestational age.  Pain/Pressure: Present     Pelvic: Cervical exam deferred        Extremities: Normal range of motion.  Edema: Trace  Mental Status: Normal mood and affect. Normal behavior. Normal judgment and thought content.   Assessment and Plan:  Pregnancy: G2P1001 at [redacted]w[redacted]d 1. Supervision of high risk pregnancy,  antepartum  2. Worsening headaches  3. ASCUS of cervix with negative high risk HPV Normal screening  4. Marginal insertion of umbilical cord affecting management of mother Normal growth  5. H/O gastric sleeve  6. Hx of preeclampsia, prior pregnancy, currently pregnant BP normal  Term labor symptoms and general obstetric precautions including but not limited to vaginal bleeding, contractions, leaking of fluid and fetal movement were reviewed in detail with the patient. Please refer to After Visit Summary for other counseling recommendations.   No follow-ups on file.  Future Appointments  Date Time Provider [place]  09/09/2022 10:55 AM Truett Mainland, DO [facility] None  09/17/2022  9:15 AM [facility] CLIN[facility]  09/17/2022  9:55 AM Truett Mainland, DO [facility] None  09/23/2022  2:10 PM Truett Mainland, DO [facility] None  10/12/2022 10:30 AM Rosemarie Ax, MD [facility]  10/26/2022 10:00 AM Ngetich, Nelda Bucks, NP [facility] None  12/09/2022 10:00 AM Berniece Salines, DO [facility] None  04/14/2023  1:30 PM Genia Harold, MD [facility] None    Truett Mainland, DO

## 2022-09-02 NOTE — Therapy (Signed)
OUTPATIENT PHYSICAL THERAPY THORACOLUMBAR TREATMENT   Patient Name: Nancy Garrett MRN: [id number] DOB:27-Feb-1991, 32 y.o., female Today's Date: 09/02/2022  END OF SESSION:  PT End of Session - 09/02/22 0829     Visit Number 2    Number of Visits 18    Date for PT Re-Evaluation 11/24/22    Authorization Type cigna    PT Start Time 0818    PT Stop Time 0858    PT Time Calculation (min) 40 min    Behavior During Therapy [facility] for tasks assessed/performed             Past Medical History:  Diagnosis Date   Chronic abdominal pain    Head ache    Past Surgical History:  Procedure Laterality Date   GASTRECTOMY  2019   Dr. Jeb Levering.   LAPAROSCOPIC GASTRIC SLEEVE RESECTION     TONSILLECTOMY     Patient Active Problem List   Diagnosis Date Noted   Ulnar neuropathy at elbow of left upper extremity 07/07/2022   Lumbar radiculopathy 07/07/2022   Maternal iron deficiency anemia complicating pregnancy in second trimester 06/19/2022   Marginal insertion of umbilical cord affecting management of mother 05/20/2022   Worsening headaches 05/05/2022   ASCUS of cervix with negative high risk HPV 03/08/2022   Supervision of high risk pregnancy, antepartum 03/01/2022   Obesity in pregnancy, antepartum 02/28/2022   H/O gastric sleeve 02/28/2022   History of gestational diabetes 02/28/2022   Hx of preeclampsia, prior pregnancy, currently pregnant 02/28/2022   Facet degeneration of lumbar region 10/15/2021    PCP: Ngetich, Nelda Bucks, NP   REFERRING PROVIDER:   Rosemarie Ax, MD    REFERRING DIAG: M54.16 (ICD-10-CM) - Lumbar radiculopathy   Rationale for Evaluation and Treatment: Rehabilitation  THERAPY DIAG:  Pain, lumbar region  Muscle weakness (generalized)  Difficulty walking  ONSET DATE: 2022  SUBJECTIVE:                                                                                                                                                                                            SUBJECTIVE STATEMENT: Pt reports she is [redacted] wks pregnant, with due date of 4/10.   She reports she had increased pain on her Rt side after evaluation, but it has decreased.  She reports she has been doing the exercises in standard chair.  Her husband brought a swiss ball to inquire if it is inflated enough.  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  PERTINENT HISTORY:  Gastric sleeve 2019;   PAIN:  Are you having pain? Yes: NPRS scale: 6/10  Pain location: bilat L/S and sacral pain, into LLE Pain description: burning/sharp Aggravating factors: rolling over in bed, turning, movement, squatting to bend down Relieving factors: lifting legs, fwd bending   PRECAUTIONS: Other: pregnancy- 3rd Trimester  WEIGHT BEARING RESTRICTIONS: No  FALLS:  Has patient fallen in last 6 months? 0   LIVING ENVIRONMENT: Lives with: lives with their family- daughter and husband help greatly Lives in: House/apartment Stairs: No Has following equipment at home: None   OCCUPATION: Hairdresser   PLOF: Independent   PATIENT GOALS Return to pain-free full function  OBJECTIVE:   DIAGNOSTIC FINDINGS:  IMPRESSION: Very mild degenerative changes at L5-S1.  PATIENT SURVEYS:  FOTO 72  63@ DC 5 pts MCII  SCREENING FOR RED FLAGS: Bowel or bladder incontinence: No Spinal tumors: No Cauda equina syndrome: No Compression fracture: No Abdominal aneurysm: No  COGNITION: Overall cognitive status: Within functional limits for tasks assessed     SENSATION: WFL  MUSCLE LENGTH: Unable to test into supine due to pt dress as well as supine laying precaution   POSTURE: decreased lumbar lordosis and anterior pelvic tilt  PALPATION: TTP of bilat lumbar paraspinals, QL, upper glute max, and hip rotators  LUMBAR ROM:   AROM eval  Flexion WFL (relief)  Extension 50% p!  Right lateral flexion 75% p!  Left lateral flexion 75 %  Right rotation P! 50%  Left  rotation 75% p!   (Blank rows = not tested)  LOWER EXTREMITY ROM:     Active  Right eval Left eval  Hip flexion 100 100  Hip extension    Hip abduction 30 30  Hip adduction    Hip internal rotation 15 20  Hip external rotation 25 35  Knee flexion 125 125  Knee extension 0 0   (Blank rows = not tested)  LOWER EXTREMITY MMT:    MMT Right eval Left eval  Hip flexion 4-/5 p! 4/5  Hip extension    Hip abduction 4/5 4+/5  Hip adduction 4+/5 4+/5  Hip internal rotation    Hip external rotation 4-/5 4/5  Knee flexion 4/5 4/5   (Blank rows = not tested)  LUMBAR SPECIAL TESTS:  Slump test: Negative and FABER test: Positive  FUNCTIONAL TESTS:  Transfers: difficulty with STS without UE   GAIT: Distance walked: 72ft Assistive device utilized: None Level of assistance: Complete Independence Comments: compensated trendelenburg; toe out, exaggerated lumbar extension   TODAY'S TREATMENT:                                                                                                                              DATE: 09/02/22 Pt seen for aquatic therapy today.  Treatment took place in water 3.5-4.75 ft in depth at the [facility] pool. Temp of water was 91.  Pt entered/exited the pool via stairs independently with bilat rail. * aquatic therapy intro * in 4+ ft of water: walking forward, backward and side stepping  *  sitting on noodle (like swing) pelvic tilts in all directions  * sitting on noodle (like bicycle): cycling, cc ski, jumping jack LE - all relaxed pace * TrA set with solid noodle pull down with cues for form x 12 * bilat and single gastroc stretch with heels off of step x 15s x 2, holding rails  Pt requires the buoyancy and hydrostatic pressure of water for support, and to offload joints by unweighting joint load by at least 50 % in navel deep water and by at least 75-80% in chest to neck deep water.  Viscosity of the water is needed for resistance of  strengthening. Water current perturbations provides challenge to standing balance requiring increased core activation.  PATIENT EDUCATION:  Education details: Sales executive educated: Patient Education method: Consulting civil engineer, Media planner, Corporate treasurer cues, Verbal cues, and Handouts Education comprehension: verbalized understanding, returned demonstration, verbal cues required, and tactile cues required  HOME EXERCISE PROGRAM:  Access Code: [email or web address] URL: [email or web address] Date: 08/26/2022 Prepared by: Daleen Bo - Seated Flexion Stretch with Swiss Ball  - 2 x daily - 7 x weekly - 2 sets - 10 reps - 5 hold - Pelvic Circles on Swiss Ball  - 2 x daily - 7 x weekly - 2 sets - 10 reps - Pelvic Tilt on Swiss Ball  - 2 x daily - 7 x weekly - 2 sets - 10 reps - Sit to Stand  - 2 x daily - 7 x weekly - 2 sets - 8 reps   PATIENT EDUCATION:  Education details:aquatic therapy intro Person educated: Patient Education method: Explanation, Demonstration, Tactile cues, Verbal cues, and Handouts Education comprehension: verbalized understanding, returned demonstration, verbal cues required, and tactile cues required  ASSESSMENT:   CLINICAL IMPRESSION: Pt is confident in aquatic environment and able to take direction from therapist on deck.  She reported significant reduction of pain in lower back and LE once submerged 75%.  Spoke to pt and husband regarding the swiss ball and need for further inflation.  Patient will benefit from continued skilled therapy in order to address functional deficits in order to prepare for potential upcoming surgery.   OBJECTIVE IMPAIRMENTS Abnormal gait, decreased activity tolerance, decreased balance, decreased mobility, difficulty walking, decreased ROM, decreased strength, hypomobility, increased muscle spasms, impaired flexibility, improper body mechanics, postural dysfunction, and pain.    ACTIVITY LIMITATIONS cleaning, community activity,  occupation, yard work, shopping, self care, and exercise.    PERSONAL FACTORS Fitness, Past/current experiences, 3+ co morbidities, and Time since onset of injury/illness/exacerbation are also affecting patient's functional outcome.      REHAB POTENTIAL: Fair   CLINICAL DECISION MAKING: unstable/complicated   EVALUATION COMPLEXITY: Moderate   GOALS:     SHORT TERM GOALS: Target date: 10/07/2022        Pt will become independent with HEP in order to demonstrate synthesis of PT education.    Goal status: INITIAL   2.  Pt will be able to demonstrate ability to squat/lift in order to demonstrate functional improvement in lumbar function for self-care and house hold duties.    Goal status: INITIAL   3.  Pt will score at least 5 pt increase on FOTO to demonstrate functional improvement in MCII and pt perceived function.     Goal status: INITIAL   4.Pt will report at least 2 pt reduction on NPRS scale for pain in order to demonstrate functional improvement with household activity, self care, and ADL.    Goal status: INITIAL  LONG TERM GOALS: Target date: 11/18/2022        Pt  will become independent with final HEP in order to demonstrate synthesis of PT education.  Goal status: INITIAL   2.  Pt will be able to demonstrate/report ability to walk >10 mins without pain in order to demonstrate functional improvement and tolerance to exercise and community mobility.    Goal status: INITIAL   3.  Pt will be able to demonstrate/report ability to sit/stand/sleep for extended periods of time without pain in order to demonstrate functional improvement and tolerance to static positioning.    Goal status: INITIAL   4.  Pt will score >/= 63 on FOTO to demonstrate improvement in perceived lumbar function.  Goal status: INITIAL     PLAN: PT FREQUENCY: 1-2x/week   PT DURATION: 12 weeks (likely D/C by 10 wks)   PLANNED INTERVENTIONS: Therapeutic exercises, Therapeutic activity,  Neuromuscular re-education, Balance training, Gait training, Patient/Family education, Joint manipulation, Joint mobilization, Stair training, Aquatic Therapy, Dry Needling, Electrical stimulation, Spinal manipulation, Spinal mobilization, Cryotherapy, Moist heat, Taping, Vasopneumatic device, Traction, Ultrasound, Ionotophoresis 4mg /ml Dexamethasone, and Manual therapy   PLAN FOR NEXT SESSION: assess response to aquatic therapy -- lumbopelvic mobility and stability, has flexion preference   Kerin Perna, PTA 09/02/22 12:42 PM [facility] [address] [place], [place], [address] Phone: [phone]   Fax:  [phone]

## 2022-09-03 NOTE — BH Specialist Note (Unsigned)
Integrated Behavioral Health via Telemedicine Visit  09/17/2022 [place] [id number]  Number of Integrated Behavioral Health Clinician visits: Additional Visit  Session Start time: 0918   Session End time: 0948  Total time in minutes: 30   Referring Provider: Gavin Pound, CNM Patient/Family location: Home [facility] Provider location: [facility] [place] [facility]  All persons participating in visit: [name] and Viola   Types of Service: Individual psychotherapy and Video visit  I connected with [name] and/or [name]'s  n/a  via  Telephone or Video Enabled Telemedicine Application  (Video is Caregility application) and verified that I am speaking with the correct person using two identifiers. Discussed confidentiality: Yes   I discussed the limitations of telemedicine and the availability of in person appointments.  Discussed there is a possibility of technology failure and discussed alternative modes of communication if that failure occurs.  I discussed that engaging in this telemedicine visit, they consent to the provision of behavioral healthcare and the services will be billed under their insurance.  Patient and/or legal guardian expressed understanding and consented to Telemedicine visit: Yes   Presenting Concerns: Patient and/or family reports the following symptoms/concerns: Processing emotions regarding recent incidents of passing out and upcoming childbirth.  Duration of problem: Current pregnancy; Severity of problem: moderate  Patient and/or Family's Strengths/Protective Factors: Social connections, Concrete supports in place (healthy food, safe environments, etc.), Sense of purpose, and Physical Health (exercise, healthy diet, medication compliance, etc.)  Goals Addressed: Patient will:  Reduce symptoms of: anxiety   Increase knowledge and/or ability of: stress reduction    Demonstrate ability to: Increase healthy adjustment to current life circumstances  Progress towards Goals: Ongoing  Interventions: Interventions utilized:  Supportive Reflection Standardized Assessments completed: Not Needed  Patient and/or Family Response: Patient agrees with treatment plan.   Assessment: Patient currently experiencing Adjustment disorder with anxious mood.   Patient may benefit from psychoeducation and brief therapeutic interventions regarding coping with symptoms of anxiety .  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 They were provided an opportunity to ask questions and all were answered. They agreed with the plan and demonstrated an understanding of the instructions.   They were advised to call back or seek an in-person evaluation if the symptoms worsen or if the condition fails to improve as anticipated.  [name], LCSW     08/19/2022   11:15 AM 06/17/2022    9:27 AM 03/09/2022    9:36 AM 02/24/2022   10:22 AM  Depression screen PHQ 2/9  Decreased Interest 0 0 0 0  Down, Depressed, Hopeless 0 0 0 0  PHQ - 2 Score 0 0 0 0  Altered sleeping 0 1 3 3   Tired, decreased energy 2 0 2 3  Change in appetite 0 0 0 3  Feeling bad or failure about yourself  0 0 0 0  Trouble concentrating 0 0 1 1  Moving slowly or fidgety/restless 0 0 0 2  Suicidal thoughts 0 0 0 0  PHQ-9 Score 2 1 6 12       08/19/2022  11:15 AM 06/17/2022    9:27 AM 06/16/2022    9:56 AM 03/09/2022    9:36 AM  GAD 7 : Generalized  Anxiety Score  Nervous, Anxious, on Edge 0 1 1 0  Control/stop worrying 0 0 2 0  Worry too much - different things 0 0 0 0  Trouble relaxing 0 1 1 0  Restless 0 0 0 0  Easily annoyed or irritable 1 1 1  0  Afraid - awful might happen 0 0 1 0  Total GAD 7 Score 1 3 6  0

## 2022-09-09 ENCOUNTER — Ambulatory Visit (INDEPENDENT_AMBULATORY_CARE_PROVIDER_SITE_OTHER): Payer: Commercial Managed Care - HMO | Admitting: Family Medicine

## 2022-09-09 VITALS — BP 105/65 | HR 113 | Wt 252.0 lb

## 2022-09-09 DIAGNOSIS — R519 Headache, unspecified: Secondary | ICD-10-CM

## 2022-09-09 DIAGNOSIS — Z3A38 38 weeks gestation of pregnancy: Secondary | ICD-10-CM

## 2022-09-09 DIAGNOSIS — D509 Iron deficiency anemia, unspecified: Secondary | ICD-10-CM

## 2022-09-09 DIAGNOSIS — O09299 Supervision of pregnancy with other poor reproductive or obstetric history, unspecified trimester: Secondary | ICD-10-CM

## 2022-09-09 DIAGNOSIS — Z8632 Personal history of gestational diabetes: Secondary | ICD-10-CM

## 2022-09-09 DIAGNOSIS — Z903 Acquired absence of stomach [part of]: Secondary | ICD-10-CM

## 2022-09-09 DIAGNOSIS — O0990 Supervision of high risk pregnancy, unspecified, unspecified trimester: Secondary | ICD-10-CM

## 2022-09-09 DIAGNOSIS — O99013 Anemia complicating pregnancy, third trimester: Secondary | ICD-10-CM

## 2022-09-09 DIAGNOSIS — O09293 Supervision of pregnancy with other poor reproductive or obstetric history, third trimester: Secondary | ICD-10-CM

## 2022-09-09 NOTE — Progress Notes (Signed)
   PRENATAL VISIT NOTE  Subjective:  Nancy Garrett is a 32 y.o. G2P1001 at [redacted]w[redacted]d being seen today for ongoing prenatal care.  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  Patient reports no complaints.  Contractions: Not present. Vag. Bleeding: None.  Movement: Present. Denies leaking of fluid.   The following portions of the patient's history were reviewed and updated as appropriate: allergies, current medications, past family history, past medical history, past social history, past surgical history and problem list.   Objective:   Vitals:   09/09/22 1111  BP: 105/65  Pulse: (!) 113  Weight: 252 lb (114.3 kg)    Fetal Status: Fetal Heart Rate (bpm): 147   Movement: Present     General:  Alert, oriented and cooperative. Patient is in no acute distress.  Skin: Skin is warm and dry. No rash noted.   Cardiovascular: Normal heart rate noted  Respiratory: Normal respiratory effort, no problems with respiration noted  Abdomen: Soft, gravid, appropriate for gestational age.  Pain/Pressure: Present     Pelvic: Cervical exam deferred        Extremities: Normal range of motion.  Edema: Trace  Mental Status: Normal mood and affect. Normal behavior. Normal judgment and thought content.   Assessment and Plan:  Pregnancy: G2P1001 at [redacted]w[redacted]d 1. Supervision of high risk pregnancy, antepartum FHT and FH normal  2. Hx of  preeclampsia, prior pregnancy, currently pregnant BP normal  3. History of gestational diabetes  4. H/O gastric sleeve  5. Maternal iron deficiency anemia complicating pregnancy in second trimester On iron  6. Worsening headaches Needs MRI with contrast after delivery to evaluate osteoma. Patient would like to wait until 40-41 weeks for induction.   Term labor symptoms and general obstetric precautions including but not limited to vaginal bleeding, contractions, leaking of fluid and fetal movement were reviewed in detail with the patient. Please refer to After Visit Summary for other counseling recommendations.   No follow-ups on file.  Future Appointments  Date Time Provider [place]  09/17/2022  9:15 AM [place] [facility]  09/17/2022  9:55 AM Truett Mainland, DO [facility] None  09/23/2022  2:10 PM Truett Mainland, DO [facility] None  10/12/2022 10:30 AM Rosemarie Ax, MD [facility]  10/26/2022 10:00 AM Ngetich, Nelda Bucks, NP [facility] None  12/09/2022 10:00 AM Berniece Salines, DO [facility] None  04/14/2023  1:30 PM Genia Harold, MD [facility] None    Truett Mainland, DO

## 2022-09-15 ENCOUNTER — Encounter (HOSPITAL_COMMUNITY): Payer: Self-pay | Admitting: Obstetrics and Gynecology

## 2022-09-15 ENCOUNTER — Inpatient Hospital Stay (EMERGENCY_DEPARTMENT_HOSPITAL)
Admission: AD | Admit: 2022-09-15 | Discharge: 2022-09-15 | Disposition: A | Discharge status: Home / Self Care | Payer: Commercial Managed Care - HMO | Source: Home / Self Care | Attending: Obstetrics and Gynecology | Admitting: Obstetrics and Gynecology

## 2022-09-15 DIAGNOSIS — O26893 Other specified pregnancy related conditions, third trimester: Secondary | ICD-10-CM | POA: Diagnosis not present

## 2022-09-15 DIAGNOSIS — O99891 Other specified diseases and conditions complicating pregnancy: Secondary | ICD-10-CM | POA: Insufficient documentation

## 2022-09-15 DIAGNOSIS — Z3A38 38 weeks gestation of pregnancy: Secondary | ICD-10-CM | POA: Insufficient documentation

## 2022-09-15 DIAGNOSIS — O09293 Supervision of pregnancy with other poor reproductive or obstetric history, third trimester: Secondary | ICD-10-CM | POA: Insufficient documentation

## 2022-09-15 DIAGNOSIS — O99013 Anemia complicating pregnancy, third trimester: Secondary | ICD-10-CM | POA: Insufficient documentation

## 2022-09-15 DIAGNOSIS — Z903 Acquired absence of stomach [part of]: Secondary | ICD-10-CM

## 2022-09-15 DIAGNOSIS — O99353 Diseases of the nervous system complicating pregnancy, third trimester: Secondary | ICD-10-CM | POA: Insufficient documentation

## 2022-09-15 DIAGNOSIS — O09299 Supervision of pregnancy with other poor reproductive or obstetric history, unspecified trimester: Secondary | ICD-10-CM

## 2022-09-15 DIAGNOSIS — R55 Syncope and collapse: Secondary | ICD-10-CM | POA: Insufficient documentation

## 2022-09-15 DIAGNOSIS — O99012 Anemia complicating pregnancy, second trimester: Secondary | ICD-10-CM

## 2022-09-15 DIAGNOSIS — O9902 Anemia complicating childbirth: Secondary | ICD-10-CM | POA: Diagnosis not present

## 2022-09-15 LAB — COMPREHENSIVE METABOLIC PANEL
ALT: 12 U/L (ref 0–44)
AST: 17 U/L (ref 15–41)
Albumin: 2.4 g/dL — ABNORMAL LOW (ref 3.5–5.0)
Alkaline Phosphatase: 172 U/L — ABNORMAL HIGH (ref 38–126)
Anion gap: 12 (ref 5–15)
BUN: 10 mg/dL (ref 6–20)
CO2: 22 mmol/L (ref 22–32)
Calcium: 9.3 mg/dL (ref 8.9–10.3)
Chloride: 100 mmol/L (ref 98–111)
Creatinine, Ser: 0.51 mg/dL (ref 0.44–1.00)
GFR, Estimated: >60 mL/min (ref >60)
Glucose, Bld: 115 mg/dL — ABNORMAL HIGH (ref 70–99)
Potassium: 3.9 mmol/L (ref 3.5–5.1)
Sodium: 134 mmol/L — ABNORMAL LOW (ref 135–145)
Total Bilirubin: 0.7 mg/dL (ref 0.3–1.2)
Total Protein: 6.1 g/dL — ABNORMAL LOW (ref 6.5–8.1)

## 2022-09-15 LAB — URINALYSIS, ROUTINE W REFLEX MICROSCOPIC
Glucose, UA: NEGATIVE mg/dL
Hgb urine dipstick: NEGATIVE
Ketones, ur: 5 mg/dL — AB
Nitrite: NEGATIVE
Protein, ur: 100 mg/dL — AB
Specific Gravity, Urine: 1.03 (ref 1.005–1.030)
pH: 5 (ref 5.0–8.0)

## 2022-09-15 LAB — CBC
HCT: 30.5 % — ABNORMAL LOW (ref 36.0–46.0)
Hemoglobin: 9.7 g/dL — ABNORMAL LOW (ref 12.0–15.0)
MCH: 25.3 pg — ABNORMAL LOW (ref 26.0–34.0)
MCHC: 31.8 g/dL (ref 30.0–36.0)
MCV: 79.4 fL — ABNORMAL LOW (ref 80.0–100.0)
Platelets: 158 10*3/uL (ref 150–400)
RBC: 3.84 MIL/uL — ABNORMAL LOW (ref 3.87–5.11)
RDW: 15.7 % — ABNORMAL HIGH (ref 11.5–15.5)
WBC: 11 10*3/uL — ABNORMAL HIGH (ref 4.0–10.5)
nRBC: 0 % (ref 0.0–0.2)

## 2022-09-15 LAB — PROTEIN / CREATININE RATIO, URINE
Creatinine, Urine: 446 mg/dL
Protein Creatinine Ratio: 0.09 mg/mg{Cre} (ref 0.00–0.15)
Total Protein, Urine: 39 mg/dL

## 2022-09-15 NOTE — MAU Provider Note (Signed)
History     CSN: [id number]  Arrival date and time: 09/15/22 1447   Event Date/Time   First Provider Initiated Contact with Patient 09/15/22 1504      Chief Complaint  Patient presents with   Loss of Consciousness   Loss of Consciousness This is a recurrent problem. The current episode started today. The problem occurs intermittently. The problem has been gradually worsening. She lost consciousness for a period of 1 to 5 minutes. The symptoms are aggravated by exertion and standing. Associated symptoms include dizziness, headaches and palpitations. Pertinent negatives include no abdominal pain, auditory change, back pain, bladder incontinence, bowel incontinence, chest pain, confusion, fever, focal sensory loss, focal weakness, light-headedness, nausea, slurred speech, vertigo, visual change, vomiting or weakness. She has tried medication for the symptoms. The treatment provided mild relief. There is no history of arrhythmia, CAD, CVA or a sudden death in family.   Patient is 32 y.o. G2P1001 [redacted]w[redacted]d here with complaints of syncope x2 today. She was in her normal state of health and was working on a client's hair. She reports feeling warm and dizzy. She sat on the couch and passed out and woke up to her daughter, client and grandmother over her.  Was feeling better momentarily but then about an hour later while on the phone with her grandmother she passed out and woke up again with multiple people around her. She reports having this happen multiple times in her pregnancy. She has seen cardiology and neurology.  Events happed at about 12:30 and 1:30 per patient. She is sure of the timing for second syncopal event since she was on the phone.   +FM, denies LOF, VB, contractions, vaginal discharge.  OB History     Gravida  2   Para  1   Term  1   Preterm      AB      Living  1      SAB      IAB      Ectopic      Multiple      Live Births  1           Past Medical  History:  Diagnosis Date   Chronic abdominal pain    Head ache     Past Surgical History:  Procedure Laterality Date   GASTRECTOMY  2019   Dr. Jeb Levering.   LAPAROSCOPIC GASTRIC SLEEVE RESECTION     TONSILLECTOMY      Family History  Problem Relation Age of Onset   High blood pressure Mother    Diabetes Father    Diabetes Sister    Stroke Sister    High blood pressure Sister     Social History   Tobacco Use   Smoking status: Never   Smokeless tobacco: Never  Vaping Use   Vaping Use: Never used  Substance Use Topics   Alcohol use: Not Currently    Alcohol/week: 2.0 standard drinks of alcohol    Types: 2 Glasses of wine per week   Drug use: Never    Allergies:  Allergies  Allergen Reactions   Iodinated Contrast Media    Ivp Dye [Iodinated Contrast Media] Nausea And Vomiting    Medications Prior to Admission  Medication Sig Dispense Refill Last Dose   aspirin EC 81 MG tablet Take 1 tablet (81 mg total) by mouth daily. Swallow whole. 90 tablet 3 09/15/2022   ferrous sulfate (FERROUSUL) 325 (65 FE) MG tablet Take 1 tablet (325 mg total)  by mouth every other day. 30 tablet 3 09/15/2022   Prenatal Vit-Fe Fumarate-FA (MULTIVITAMIN-PRENATAL) 27-0.8 MG TABS tablet Take 1 tablet by mouth daily at 12 noon.   09/15/2022   propranolol (INDERAL) 10 MG tablet Take 1 tablet (10 mg total) by mouth at bedtime. (Patient taking differently: Take 10 mg by mouth 2 (two) times daily.) 90 tablet 1     Review of Systems  Constitutional:  Negative for fever.  Cardiovascular:  Positive for palpitations and syncope. Negative for chest pain.  Gastrointestinal:  Negative for abdominal pain, bowel incontinence, nausea and vomiting.  Genitourinary:  Negative for bladder incontinence, dysuria and frequency.  Musculoskeletal:  Negative for back pain.  Skin:  Negative for pallor.  Neurological:  Positive for dizziness, syncope and headaches. Negative for vertigo, focal weakness, seizures,  speech difficulty, weakness, light-headedness and numbness.  Psychiatric/Behavioral:  Negative for confusion.    Physical Exam   Blood pressure 112/75, pulse 91, temperature 98.1 F (36.7 C), resp. rate 18, last menstrual period 12/17/2021.  Physical Exam Vitals and nursing note reviewed.  Constitutional:      General: She is not in acute distress.    Appearance: She is well-developed.     Comments: Pregnant female  HENT:     Head: Normocephalic and atraumatic.  Eyes:     General: No scleral icterus.    Conjunctiva/sclera: Conjunctivae normal.  Cardiovascular:     Rate and Rhythm: Normal rate.  Pulmonary:     Effort: Pulmonary effort is normal.  Chest:     Chest wall: No tenderness.  Abdominal:     Palpations: Abdomen is soft.     Tenderness: There is no abdominal tenderness. There is no guarding or rebound.     Comments: Gravid ~39wk  Genitourinary:    Vagina: Normal.  Musculoskeletal:        General: Normal range of motion.     Cervical back: Normal range of motion and neck supple.  Skin:    General: Skin is warm and dry.     Findings: No rash.  Neurological:     General: No focal deficit present.     Mental Status: She is alert and oriented to person, place, and time. Mental status is at baseline.     Cranial Nerves: No cranial nerve deficit.  Psychiatric:        Mood and Affect: Mood normal.        Behavior: Behavior normal.     MAU Course  Procedures NST: 125/Moderate/+accels, negative decels Toco: quiet On continuous fetal monitoring for 4 hrs  MDM: high  This patient presents to the ED for concern of   Chief Complaint  Patient presents with   Loss of Consciousness     These complains involves an extensive number of treatment options, and is a complaint that carries with it a high risk of complications and morbidity.  The differential diagnosis for  1. Syncope INCLUDES vasovagal, cardiac etiology, neurologic syncope.    Co morbidities that  complicate the patient evaluation:   External records from outside source obtained and reviewed including Prenatal care records  Lab Tests: UA, CMP, CBC, and Urine Protein Creatinine Ratio  I ordered, and personally interpreted labs.  The pertinent results include:   Results for orders placed or performed during the hospital encounter of 09/15/22 (from the past 24 hour(s))  Urinalysis, Routine w reflex microscopic -Urine, Clean Catch     Status: Abnormal   Collection Time: 09/15/22  3:28 PM  Result Value Ref Range   Color, Urine AMBER (A) YELLOW   APPearance CLOUDY (A) CLEAR   Specific Gravity, Urine 1.030 1.005 - 1.030   pH 5.0 5.0 - 8.0   Glucose, UA NEGATIVE NEGATIVE mg/dL   Hgb urine dipstick NEGATIVE NEGATIVE   Bilirubin Urine SMALL (A) NEGATIVE   Ketones, ur 5 (A) NEGATIVE mg/dL   Protein, ur 100 (A) NEGATIVE mg/dL   Nitrite NEGATIVE NEGATIVE   Leukocytes,Ua MODERATE (A) NEGATIVE   RBC / HPF 6-10 0 - 5 RBC/hpf   WBC, UA 11-20 0 - 5 WBC/hpf   Bacteria, UA MANY (A) NONE SEEN   Squamous Epithelial / HPF 21-50 0 - 5 /HPF   Mucus PRESENT   Protein / creatinine ratio, urine     Status: None   Collection Time: 09/15/22  4:03 PM  Result Value Ref Range   Creatinine, Urine 446 mg/dL   Total Protein, Urine 39 mg/dL   Protein Creatinine Ratio 0.09 0.00 - 0.15 mg/mg[Cre]  Comprehensive metabolic panel     Status: Abnormal   Collection Time: 09/15/22  4:17 PM  Result Value Ref Range   Sodium 134 (L) 135 - 145 mmol/L   Potassium 3.9 3.5 - 5.1 mmol/L   Chloride 100 98 - 111 mmol/L   CO2 22 22 - 32 mmol/L   Glucose, Bld 115 (H) 70 - 99 mg/dL   BUN 10 6 - 20 mg/dL   Creatinine, Ser 0.51 0.44 - 1.00 mg/dL   Calcium 9.3 8.9 - 10.3 mg/dL   Total Protein 6.1 (L) 6.5 - 8.1 g/dL   Albumin 2.4 (L) 3.5 - 5.0 g/dL   AST 17 15 - 41 U/L   ALT 12 0 - 44 U/L   Alkaline Phosphatase 172 (H) 38 - 126 U/L   Total Bilirubin 0.7 0.3 - 1.2 mg/dL   GFR, Estimated >60 >60 mL/min   Anion gap 12 5 -  15  CBC     Status: Abnormal   Collection Time: 09/15/22  4:17 PM  Result Value Ref Range   WBC 11.0 (H) 4.0 - 10.5 K/uL   RBC 3.84 (L) 3.87 - 5.11 MIL/uL   Hemoglobin 9.7 (L) 12.0 - 15.0 g/dL   HCT 30.5 (L) 36.0 - 46.0 %   MCV 79.4 (L) 80.0 - 100.0 fL   MCH 25.3 (L) 26.0 - 34.0 pg   MCHC 31.8 30.0 - 36.0 g/dL   RDW 15.7 (H) 11.5 - 15.5 %   Platelets 158 150 - 400 K/uL   nRBC 0.0 0.0 - 0.2 %    Cardiac Testing/Monitoring:  EKG was ordered today. I personally reviewed and bessdie mild sinus tachycardia the [place] is overall WNL.   Medicines ordered and prescription drug management:  Medications: None    I have reviewed the patients home medicines and have made adjustments as needed  Test Considered: CT Head  Critical Interventions: Other Extended monitoring   MAU Course: 7:25 PM Repeat exam remains normal. Discussed with patient and her partner [name] that I recommend induction in the 39th week due to syncope x2 in the last 24 hours. I do not think there is immediate need to induce.  The patient has an aqua therapy class tomorrow and wants to attend and would like to discuss with Dr. Nehemiah Settle on Thursday which is reasonable. I offered to check her cervix and perform membrane sweeping and she declined this today but will consider at her appt on Thursday.    After the  interventions noted above, I reevaluated the patient and found that they have :improved  Dispostion: discharged   Assessment and Plan   1. Vasovagal syncope   2. [redacted] weeks gestation of pregnancy   3. H/O gastric sleeve   4. Hx of preeclampsia, prior pregnancy, currently pregnant   5. Maternal iron deficiency anemia complicating pregnancy in second trimester    - Discharge home - Strict return precautions - Recommend induction in the 39th week, date TBD after discussion with outpatient  - Instructed NOT to drive and recommended stopping all work and prolonged standing.   Juanita Craver [facility] 09/15/2022, 4:23  PM

## 2022-09-15 NOTE — Discharge Instructions (Signed)
Your work up was overall normal. Since you have had two syncopal episodes in 24 hours it is very reason to be induced at 39 weeks.  We discussed it is also reasonable to monitor your symptoms and discuss with your OB Dr. Nehemiah Settle.   If you have another syncopal event in the next 12 hours I would recommend induction immediately.   I recommend you stop working and prolonged standing I recommend that you do not drive

## 2022-09-15 NOTE — MAU Note (Signed)
.  Nancy Garrett is a 32 y.o. at [redacted]w[redacted]d here in MAU reporting: had 2 episodes of passing out. Got hot and then a headache stared and she passed out came too and happen about an hour later again.Pt has had syncopal episodes  through out her pregnancy. Has a Dx of brain mass that will be further evaluated after delivery. Pt feels fine now. No headache at this time. LMP:  Onset of complaint: 1.5 hours ago Pain score: 0 Vitals:   09/15/22 1517  BP: 112/75  Pulse: 91  Resp: 18  Temp: 98.1 F (36.7 C)     FHT:140 Lab orders placed from triage:

## 2022-09-16 ENCOUNTER — Ambulatory Visit (HOSPITAL_BASED_OUTPATIENT_CLINIC_OR_DEPARTMENT_OTHER): Payer: Commercial Managed Care - HMO | Attending: Family Medicine | Admitting: Physical Therapy

## 2022-09-16 ENCOUNTER — Encounter (HOSPITAL_BASED_OUTPATIENT_CLINIC_OR_DEPARTMENT_OTHER): Payer: Self-pay | Admitting: Physical Therapy

## 2022-09-16 DIAGNOSIS — R262 Difficulty in walking, not elsewhere classified: Secondary | ICD-10-CM | POA: Insufficient documentation

## 2022-09-16 DIAGNOSIS — R293 Abnormal posture: Secondary | ICD-10-CM | POA: Insufficient documentation

## 2022-09-16 DIAGNOSIS — M6281 Muscle weakness (generalized): Secondary | ICD-10-CM | POA: Insufficient documentation

## 2022-09-16 DIAGNOSIS — M545 Low back pain, unspecified: Secondary | ICD-10-CM | POA: Insufficient documentation

## 2022-09-16 NOTE — Therapy (Addendum)
OUTPATIENT PHYSICAL THERAPY THORACOLUMBAR TREATMENT  PHYSICAL THERAPY DISCHARGE SUMMARY  Visits from Start of Care: 3  Plan: Patient agrees to discharge.  Patient goals were not met. Patient is being discharged due to not returning to therapy.       Patient Name: Nancy Garrett MRN: [id number] DOB:December 08, 1990, 32 y.o., female Today's Date: 09/16/2022  END OF SESSION:  PT End of Session - 09/16/22 1126     Visit Number 3    Number of Visits 18    Date for PT Re-Evaluation 11/24/22    Authorization Type cigna    PT Start Time 1120    PT Stop Time 1200    PT Time Calculation (min) 40 min    Activity Tolerance Patient tolerated treatment well    Behavior During Therapy [facility] for tasks assessed/performed             Past Medical History:  Diagnosis Date   Chronic abdominal pain    Head ache    Past Surgical History:  Procedure Laterality Date   GASTRECTOMY  2019   Dr. Darcella Gasman.   LAPAROSCOPIC GASTRIC SLEEVE RESECTION     TONSILLECTOMY     Patient Active Problem List   Diagnosis Date Noted   Ulnar neuropathy at elbow of left upper extremity 07/07/2022   Lumbar radiculopathy 07/07/2022   Maternal iron deficiency anemia complicating pregnancy in second trimester 06/19/2022   Marginal insertion of umbilical cord affecting management of mother 05/20/2022   Worsening headaches 05/05/2022   ASCUS of cervix with negative high risk HPV 03/08/2022   Supervision of high risk pregnancy, antepartum 03/01/2022   Obesity in pregnancy, antepartum 02/28/2022   H/O gastric sleeve 02/28/2022   History of gestational diabetes 02/28/2022   Hx of preeclampsia, prior pregnancy, currently pregnant 02/28/2022   Facet degeneration of lumbar region 10/15/2021    PCP: Ngetich, Donalee Citrin, NP   REFERRING PROVIDER:   Myra Rude, MD    REFERRING DIAG: M54.16 (ICD-10-CM) - Lumbar radiculopathy   Rationale for Evaluation and Treatment: Rehabilitation  THERAPY DIAG:   Pain, lumbar region  Muscle weakness (generalized)  Difficulty walking  Abnormal posture  ONSET DATE: 2022  SUBJECTIVE:                                                                                                                                                                                           SUBJECTIVE STATEMENT: Pt reports she is [redacted] wks pregnant, with due date of 4/10. She states she passed out yesterday 2x yesterday; was seen at ER yesterday. Feeling better today.     PERTINENT HISTORY:  Gastric sleeve  2019;   PAIN:  Are you having pain? Yes: NPRS scale: 4/10 Pain location: bilat L/S and sacral pain Pain description: burning/sharp Aggravating factors: rolling over in bed, turning, movement, squatting to bend down Relieving factors: lifting legs, fwd bending   PRECAUTIONS: Other: pregnancy- 3rd Trimester  WEIGHT BEARING RESTRICTIONS: No  FALLS:  Has patient fallen in last 6 months? 0   LIVING ENVIRONMENT: Lives with: lives with their family- daughter and husband help greatly Lives in: House/apartment Stairs: No Has following equipment at home: None   OCCUPATION: Hairdresser   PLOF: Independent   PATIENT GOALS Return to pain-free full function  OBJECTIVE:   DIAGNOSTIC FINDINGS:  IMPRESSION: Very mild degenerative changes at L5-S1.  PATIENT SURVEYS:  FOTO 47  63@ DC 5 pts MCII  SCREENING FOR RED FLAGS: Bowel or bladder incontinence: No Spinal tumors: No Cauda equina syndrome: No Compression fracture: No Abdominal aneurysm: No  COGNITION: Overall cognitive status: Within functional limits for tasks assessed     SENSATION: WFL  MUSCLE LENGTH: Unable to test into supine due to pt dress as well as supine laying precaution   POSTURE: decreased lumbar lordosis and anterior pelvic tilt  PALPATION: TTP of bilat lumbar paraspinals, QL, upper glute max, and hip rotators  LUMBAR ROM:   AROM eval  Flexion WFL (relief)  Extension 50% p!   Right lateral flexion 75% p!  Left lateral flexion 75 %  Right rotation P! 50%  Left rotation 75% p!   (Blank rows = not tested)  LOWER EXTREMITY ROM:     Active  Right eval Left eval  Hip flexion 100 100  Hip extension    Hip abduction 30 30  Hip adduction    Hip internal rotation 15 20  Hip external rotation 25 35  Knee flexion 125 125  Knee extension 0 0   (Blank rows = not tested)  LOWER EXTREMITY MMT:    MMT Right eval Left eval  Hip flexion 4-/5 p! 4/5  Hip extension    Hip abduction 4/5 4+/5  Hip adduction 4+/5 4+/5  Hip internal rotation    Hip external rotation 4-/5 4/5  Knee flexion 4/5 4/5   (Blank rows = not tested)  LUMBAR SPECIAL TESTS:  Slump test: Negative and FABER test: Positive  FUNCTIONAL TESTS:  Transfers: difficulty with STS without UE   GAIT: Distance walked: 18ft Assistive device utilized: None Level of assistance: Complete Independence Comments: compensated trendelenburg; toe out, exaggerated lumbar extension   TODAY'S TREATMENT:                                                                                                                              DATE: 09/16/22 Pt seen for aquatic therapy today.  Treatment took place in water 3.5-4.75 ft in depth at the [facility] pool. Temp of water was 91.  Pt entered/exited the pool via stairs independently with bilat rail.  * in 4+ ft of water:  walking forward, backward and side stepping  * holding wall:  LE circles x 10 each LE;  relaxed squats x 10 * sitting on noodle (like swing) pelvic tilts in all directions  * sitting on noodle (like bicycle): cycling, cc ski, jumping jack LE - all relaxed pace, 2 rounds * wall push up/push offs x 10 * TrA set with solid noodle pull down with cues for form x 12 * supported decompression with yellow noodle under arms * bilat gastroc stretch with heels off of step x 30s, holding rails  Pt requires the buoyancy and hydrostatic pressure of  water for support, and to offload joints by unweighting joint load by at least 50 % in navel deep water and by at least 75-80% in chest to neck deep water.  Viscosity of the water is needed for resistance of strengthening. Water current perturbations provides challenge to standing balance requiring increased core activation.  PATIENT EDUCATION:  Education details: exercise progressions/modifications   Person educated: Patient Education method: Explanation, Demonstration, Tactile cues, Verbal cues,  Education comprehension: verbalized understanding, returned demonstration, verbal cues required, and tactile cues required  HOME EXERCISE PROGRAM:  Access Code: [email or web address] URL: [email or web address] Date: 08/26/2022 Prepared by: Zebedee Iba - Seated Flexion Stretch with Swiss Ball  - 2 x daily - 7 x weekly - 2 sets - 10 reps - 5 hold - Pelvic Circles on Swiss Ball  - 2 x daily - 7 x weekly - 2 sets - 10 reps - Pelvic Tilt on Swiss Ball  - 2 x daily - 7 x weekly - 2 sets - 10 reps - Sit to Stand  - 2 x daily - 7 x weekly - 2 sets - 8 reps   ASSESSMENT:   CLINICAL IMPRESSION: Pt reported significant reduction of pain in lower back and LE once submerged 75%.  She tolerated all exercises well, without any increase in pain.  Plan for pt to return after cleared by MD (after birth of child).  Goals are ongoing.    OBJECTIVE IMPAIRMENTS Abnormal gait, decreased activity tolerance, decreased balance, decreased mobility, difficulty walking, decreased ROM, decreased strength, hypomobility, increased muscle spasms, impaired flexibility, improper body mechanics, postural dysfunction, and pain.    ACTIVITY LIMITATIONS cleaning, community activity, occupation, yard work, shopping, self care, and exercise.    PERSONAL FACTORS Fitness, Past/current experiences, 3+ co morbidities, and Time since onset of injury/illness/exacerbation are also affecting patient's functional outcome.      REHAB  POTENTIAL: Fair   CLINICAL DECISION MAKING: unstable/complicated   EVALUATION COMPLEXITY: Moderate   GOALS:     SHORT TERM GOALS: Target date: 10/07/2022        Pt will become independent with HEP in order to demonstrate synthesis of PT education.    Goal status: INITIAL   2.  Pt will be able to demonstrate ability to squat/lift in order to demonstrate functional improvement in lumbar function for self-care and house hold duties.    Goal status: INITIAL   3.  Pt will score at least 5 pt increase on FOTO to demonstrate functional improvement in MCII and pt perceived function.     Goal status: INITIAL   4.Pt will report at least 2 pt reduction on NPRS scale for pain in order to demonstrate functional improvement with household activity, self care, and ADL.    Goal status: INITIAL     LONG TERM GOALS: Target date: 11/18/2022        Pt  will become independent with final  HEP in order to demonstrate synthesis of PT education.  Goal status: INITIAL   2.  Pt will be able to demonstrate/report ability to walk >10 mins without pain in order to demonstrate functional improvement and tolerance to exercise and community mobility.    Goal status: INITIAL   3.  Pt will be able to demonstrate/report ability to sit/stand/sleep for extended periods of time without pain in order to demonstrate functional improvement and tolerance to static positioning.    Goal status: INITIAL   4.  Pt will score >/= 63 on FOTO to demonstrate improvement in perceived lumbar function.  Goal status: INITIAL     PLAN: PT FREQUENCY: 1-2x/week   PT DURATION: 12 weeks (likely D/C by 10 wks)   PLANNED INTERVENTIONS: Therapeutic exercises, Therapeutic activity, Neuromuscular re-education, Balance training, Gait training, Patient/Family education, Joint manipulation, Joint mobilization, Stair training, Aquatic Therapy, Dry Needling, Electrical stimulation, Spinal manipulation, Spinal mobilization,  Cryotherapy, Moist heat, Taping, Vasopneumatic device, Traction, Ultrasound, Ionotophoresis 4mg /ml Dexamethasone, and Manual therapy   PLAN FOR NEXT SESSION: assess response to aquatic therapy -- lumbopelvic mobility and stability, has flexion preference  Mayer Camel, PTA 09/16/22 1:59 PM [facility] [address] [place], [place], [address] Phone: [phone]   Fax:  [phone]

## 2022-09-17 ENCOUNTER — Ambulatory Visit (INDEPENDENT_AMBULATORY_CARE_PROVIDER_SITE_OTHER): Payer: Commercial Managed Care - HMO | Admitting: Family Medicine

## 2022-09-17 ENCOUNTER — Ambulatory Visit (INDEPENDENT_AMBULATORY_CARE_PROVIDER_SITE_OTHER): Payer: Commercial Managed Care - HMO | Admitting: Clinical

## 2022-09-17 VITALS — BP 99/69 | HR 91 | Wt 251.0 lb

## 2022-09-17 DIAGNOSIS — O99013 Anemia complicating pregnancy, third trimester: Secondary | ICD-10-CM

## 2022-09-17 DIAGNOSIS — F4322 Adjustment disorder with anxiety: Secondary | ICD-10-CM

## 2022-09-17 DIAGNOSIS — D509 Iron deficiency anemia, unspecified: Secondary | ICD-10-CM

## 2022-09-17 DIAGNOSIS — Z3A39 39 weeks gestation of pregnancy: Secondary | ICD-10-CM

## 2022-09-17 DIAGNOSIS — O43199 Other malformation of placenta, unspecified trimester: Secondary | ICD-10-CM

## 2022-09-17 DIAGNOSIS — O43193 Other malformation of placenta, third trimester: Secondary | ICD-10-CM

## 2022-09-17 DIAGNOSIS — Z903 Acquired absence of stomach [part of]: Secondary | ICD-10-CM

## 2022-09-17 DIAGNOSIS — R87610 Atypical squamous cells of undetermined significance on cytologic smear of cervix (ASC-US): Secondary | ICD-10-CM

## 2022-09-17 DIAGNOSIS — O09299 Supervision of pregnancy with other poor reproductive or obstetric history, unspecified trimester: Secondary | ICD-10-CM

## 2022-09-17 DIAGNOSIS — R519 Headache, unspecified: Secondary | ICD-10-CM

## 2022-09-17 DIAGNOSIS — O0990 Supervision of high risk pregnancy, unspecified, unspecified trimester: Secondary | ICD-10-CM

## 2022-09-17 NOTE — Progress Notes (Signed)
   PRENATAL VISIT NOTE  Subjective:  Nancy Garrett is a 32 y.o. G2P1001 at [redacted]w[redacted]d being seen today for ongoing prenatal care.  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  Patient reports  had syncope 2 days ago. Nothing since then. No palpitations, lightheadedness .  Contractions: Irritability. Vag. Bleeding: None.  Movement: Present. Denies leaking of fluid.   The following portions of the patient's history were reviewed and updated as appropriate: allergies, current medications, past family history, past medical history, past social history, past surgical history and problem list.   Objective:   Vitals:   09/17/22 1314  BP: 99/69  Pulse: 91  Weight: 251 lb (113.9 kg)    Fetal Status: Fetal Heart Rate (bpm): 130 Fundal Height: 39 cm Movement: Present  Presentation: Vertex  General:  Alert, oriented and cooperative. Patient is in no acute distress.  Skin: Skin is warm and dry. No rash noted.   Cardiovascular: Normal heart rate noted  Respiratory: Normal respiratory effort, no problems with respiration noted  Abdomen: Soft, gravid, appropriate for gestational age.  Pain/Pressure: Present     Pelvic: Cervical exam performed in the presence of a chaperone Dilation: 3 Effacement (%): Thick Station: -3  Extremities: Normal range of motion.  Edema: None  Mental Status: Normal mood and affect. Normal behavior. Normal  judgment and thought content.   Assessment and Plan:  Pregnancy: G2P1001 at [redacted]w[redacted]d 1. Supervision of high risk pregnancy, antepartum Would like to be on induction list. Elective induction scheduled - CBC; Standing - Type and screen; Standing - RPR; Standing  2. Worsening headaches Needs PP MRI  3. H/O gastric sleeve  4. ASCUS of cervix with negative high risk HPV Routine PAP  5. Marginal insertion of umbilical cord affecting management of mother Normal growth  6. Maternal iron deficiency anemia complicating pregnancy in second trimester On iron  7. Hx of preeclampsia, prior pregnancy, currently pregnant BP normal  Term labor symptoms and general obstetric precautions including but not limited to vaginal bleeding, contractions, leaking of fluid and fetal movement were reviewed in detail with the patient. Please refer to After Visit Summary for other counseling recommendations.   No follow-ups on file.  Future Appointments  Date Time Provider [place]  09/23/2022  2:10 PM Truett Mainland, DO [facility] None  10/08/2022 10:45 AM [facility]  10/12/2022 10:30 AM Rosemarie Ax, MD [facility]  10/26/2022 10:00 AM Ngetich, Nelda Bucks, NP [facility] None  12/09/2022 10:00 AM Berniece Salines, DO [facility] None  04/14/2023  1:30 PM Genia Harold, MD [facility] None    Truett Mainland, DO

## 2022-09-17 NOTE — Progress Notes (Signed)
ROB 39.[redacted] wks GA Recent MAU visit for syncopal episode X 2.

## 2022-09-18 ENCOUNTER — Inpatient Hospital Stay (HOSPITAL_COMMUNITY)
Admission: AD | Admit: 2022-09-18 | Discharge: 2022-09-20 | DRG: 806 | Disposition: A | Discharge status: Home / Self Care | Payer: Commercial Managed Care - HMO | Attending: Obstetrics & Gynecology | Admitting: Obstetrics & Gynecology

## 2022-09-18 ENCOUNTER — Inpatient Hospital Stay (HOSPITAL_COMMUNITY): Payer: Commercial Managed Care - HMO | Admitting: Anesthesiology

## 2022-09-18 ENCOUNTER — Inpatient Hospital Stay (HOSPITAL_COMMUNITY): Payer: Commercial Managed Care - HMO

## 2022-09-18 ENCOUNTER — Encounter (HOSPITAL_COMMUNITY): Payer: Self-pay | Admitting: Family Medicine

## 2022-09-18 ENCOUNTER — Other Ambulatory Visit: Payer: Self-pay

## 2022-09-18 DIAGNOSIS — D62 Acute posthemorrhagic anemia: Secondary | ICD-10-CM | POA: Diagnosis not present

## 2022-09-18 DIAGNOSIS — Z3A39 39 weeks gestation of pregnancy: Secondary | ICD-10-CM | POA: Diagnosis not present

## 2022-09-18 DIAGNOSIS — O26893 Other specified pregnancy related conditions, third trimester: Secondary | ICD-10-CM | POA: Diagnosis present

## 2022-09-18 DIAGNOSIS — O0990 Supervision of high risk pregnancy, unspecified, unspecified trimester: Secondary | ICD-10-CM

## 2022-09-18 DIAGNOSIS — O4423 Partial placenta previa NOS or without hemorrhage, third trimester: Secondary | ICD-10-CM

## 2022-09-18 DIAGNOSIS — O99844 Bariatric surgery status complicating childbirth: Secondary | ICD-10-CM | POA: Diagnosis present

## 2022-09-18 DIAGNOSIS — O43123 Velamentous insertion of umbilical cord, third trimester: Secondary | ICD-10-CM | POA: Diagnosis present

## 2022-09-18 DIAGNOSIS — O4202 Full-term premature rupture of membranes, onset of labor within 24 hours of rupture: Secondary | ICD-10-CM

## 2022-09-18 DIAGNOSIS — Z87891 Personal history of nicotine dependence: Secondary | ICD-10-CM

## 2022-09-18 DIAGNOSIS — O99214 Obesity complicating childbirth: Secondary | ICD-10-CM | POA: Diagnosis present

## 2022-09-18 DIAGNOSIS — O9902 Anemia complicating childbirth: Secondary | ICD-10-CM | POA: Diagnosis present

## 2022-09-18 DIAGNOSIS — Z7982 Long term (current) use of aspirin: Secondary | ICD-10-CM

## 2022-09-18 DIAGNOSIS — Z3490 Encounter for supervision of normal pregnancy, unspecified, unspecified trimester: Principal | ICD-10-CM | POA: Diagnosis present

## 2022-09-18 LAB — RPR: RPR Ser Ql: NONREACTIVE

## 2022-09-18 LAB — CBC
HCT: 32.4 % — ABNORMAL LOW (ref 36.0–46.0)
Hemoglobin: 9.7 g/dL — ABNORMAL LOW (ref 12.0–15.0)
MCH: 24.1 pg — ABNORMAL LOW (ref 26.0–34.0)
MCHC: 29.9 g/dL — ABNORMAL LOW (ref 30.0–36.0)
MCV: 80.6 fL (ref 80.0–100.0)
Platelets: 177 10*3/uL (ref 150–400)
RBC: 4.02 MIL/uL (ref 3.87–5.11)
RDW: 15.9 % — ABNORMAL HIGH (ref 11.5–15.5)
WBC: 10.8 10*3/uL — ABNORMAL HIGH (ref 4.0–10.5)
nRBC: 0 % (ref 0.0–0.2)

## 2022-09-18 LAB — TYPE AND SCREEN
ABO/RH(D): O POS
Antibody Screen: NEGATIVE

## 2022-09-18 MED ORDER — EPHEDRINE 5 MG/ML INJ: 10.0000 mg | INTRAVENOUS | Status: DC | PRN

## 2022-09-18 MED ORDER — DIPHENHYDRAMINE HCL 25 MG PO CAPS: 25.0000 mg | ORAL_CAPSULE | Freq: Four times a day (QID) | ORAL | Status: DC | PRN

## 2022-09-18 MED ORDER — LACTATED RINGERS IV SOLN: 500.0000 mL | INTRAVENOUS | Status: DC | PRN

## 2022-09-18 MED ORDER — SOD CITRATE-CITRIC ACID 500-334 MG/5ML PO SOLN: 30.0000 mL | ORAL | Status: DC | PRN

## 2022-09-18 MED ORDER — BENZOCAINE-MENTHOL 20-0.5 % EX AERO
1.0000 | INHALATION_SPRAY | CUTANEOUS | Status: DC | PRN
  Administered 2022-09-18: 1 via TOPICAL
  Filled 2022-09-18: qty 56

## 2022-09-18 MED ORDER — DIBUCAINE (PERIANAL) 1 % EX OINT: 1.0000 | TOPICAL_OINTMENT | CUTANEOUS | Status: DC | PRN

## 2022-09-18 MED ORDER — OXYCODONE-ACETAMINOPHEN 5-325 MG PO TABS: 1.0000 | ORAL_TABLET | ORAL | Status: DC | PRN

## 2022-09-18 MED ORDER — LACTATED RINGERS IV SOLN: INTRAVENOUS | Status: DC

## 2022-09-18 MED ORDER — OXYTOCIN-SODIUM CHLORIDE 30-0.9 UT/500ML-% IV SOLN
2.5000 [IU]/h | INTRAVENOUS | Status: DC
  Administered 2022-09-18: 2.5 [IU]/h via INTRAVENOUS

## 2022-09-18 MED ORDER — DIPHENHYDRAMINE HCL 50 MG/ML IJ SOLN: 12.5000 mg | INTRAMUSCULAR | Status: DC | PRN

## 2022-09-18 MED ORDER — IBUPROFEN 600 MG PO TABS
600.0000 mg | ORAL_TABLET | Freq: Four times a day (QID) | ORAL | Status: DC
  Administered 2022-09-18 – 2022-09-20 (×6): 600 mg via ORAL
  Filled 2022-09-18 (×7): qty 1

## 2022-09-18 MED ORDER — TETANUS-DIPHTH-ACELL PERTUSSIS 5-2.5-18.5 LF-MCG/0.5 IM SUSY: 0.5000 mL | PREFILLED_SYRINGE | Freq: Once | INTRAMUSCULAR | Status: DC

## 2022-09-18 MED ORDER — OXYTOCIN-SODIUM CHLORIDE 30-0.9 UT/500ML-% IV SOLN
1.0000 m[IU]/min | INTRAVENOUS | Status: DC
  Administered 2022-09-18: 2 m[IU]/min via INTRAVENOUS
  Filled 2022-09-18: qty 500

## 2022-09-18 MED ORDER — ONDANSETRON HCL 4 MG PO TABS: 4.0000 mg | ORAL_TABLET | ORAL | Status: DC | PRN

## 2022-09-18 MED ORDER — ONDANSETRON HCL 4 MG/2ML IJ SOLN: 4.0000 mg | Freq: Four times a day (QID) | INTRAMUSCULAR | Status: DC | PRN

## 2022-09-18 MED ORDER — PRENATAL MULTIVITAMIN CH
1.0000 | ORAL_TABLET | Freq: Every day | ORAL | Status: DC
  Administered 2022-09-19: 1 via ORAL
  Filled 2022-09-18 (×2): qty 1

## 2022-09-18 MED ORDER — LIDOCAINE HCL (PF) 1 % IJ SOLN: 30.0000 mL | INTRAMUSCULAR | Status: DC | PRN

## 2022-09-18 MED ORDER — ACETAMINOPHEN 325 MG PO TABS
650.0000 mg | ORAL_TABLET | ORAL | Status: DC | PRN
  Administered 2022-09-19: 650 mg via ORAL
  Filled 2022-09-18: qty 2

## 2022-09-18 MED ORDER — ACETAMINOPHEN 325 MG PO TABS: 650.0000 mg | ORAL_TABLET | ORAL | Status: DC | PRN

## 2022-09-18 MED ORDER — LIDOCAINE HCL (PF) 1 % IJ SOLN
INTRAMUSCULAR | Status: DC | PRN
  Administered 2022-09-18: 2 mL via EPIDURAL
  Administered 2022-09-18: 3 mL via EPIDURAL
  Administered 2022-09-18: 5 mL via EPIDURAL

## 2022-09-18 MED ORDER — SENNOSIDES-DOCUSATE SODIUM 8.6-50 MG PO TABS
2.0000 | ORAL_TABLET | Freq: Every day | ORAL | Status: DC
  Administered 2022-09-19 – 2022-09-20 (×2): 2 via ORAL
  Filled 2022-09-18 (×2): qty 2

## 2022-09-18 MED ORDER — PHENYLEPHRINE 80 MCG/ML (10ML) SYRINGE FOR IV PUSH (FOR BLOOD PRESSURE SUPPORT): 80.0000 ug | PREFILLED_SYRINGE | INTRAVENOUS | Status: DC | PRN

## 2022-09-18 MED ORDER — COCONUT OIL OIL
1.0000 | TOPICAL_OIL | Status: DC | PRN
  Administered 2022-09-20: 1 via TOPICAL

## 2022-09-18 MED ORDER — OXYTOCIN BOLUS FROM INFUSION
333.0000 mL | Freq: Once | INTRAVENOUS | Status: AC
  Administered 2022-09-18: 333 mL via INTRAVENOUS

## 2022-09-18 MED ORDER — OXYCODONE-ACETAMINOPHEN 5-325 MG PO TABS: 2.0000 | ORAL_TABLET | ORAL | Status: DC | PRN

## 2022-09-18 MED ORDER — FENTANYL-BUPIVACAINE-NACL 0.5-0.125-0.9 MG/250ML-% EP SOLN
12.0000 mL/h | EPIDURAL | Status: DC | PRN
  Administered 2022-09-18: 12 mL/h via EPIDURAL
  Filled 2022-09-18: qty 250

## 2022-09-18 MED ORDER — ONDANSETRON HCL 4 MG/2ML IJ SOLN: 4.0000 mg | INTRAMUSCULAR | Status: DC | PRN

## 2022-09-18 MED ORDER — WITCH HAZEL-GLYCERIN EX PADS: 1.0000 | MEDICATED_PAD | CUTANEOUS | Status: DC | PRN

## 2022-09-18 MED ORDER — LACTATED RINGERS IV SOLN
500.0000 mL | Freq: Once | INTRAVENOUS | Status: AC
  Administered 2022-09-18: 500 mL via INTRAVENOUS

## 2022-09-18 MED ORDER — TERBUTALINE SULFATE 1 MG/ML IJ SOLN: 0.2500 mg | Freq: Once | INTRAMUSCULAR | Status: DC | PRN

## 2022-09-18 MED ORDER — SIMETHICONE 80 MG PO CHEW: 80.0000 mg | CHEWABLE_TABLET | ORAL | Status: DC | PRN

## 2022-09-18 MED ORDER — LACTATED RINGERS IV SOLN: 500.0000 mL | Freq: Once | INTRAVENOUS | Status: AC

## 2022-09-18 NOTE — Progress Notes (Signed)
OB/GYN Faculty Practice: Labor Progress Note  Subjective: Now comfortable with epidural.   Objective: BP 115/68   Pulse 78   Temp 98.8 F (37.1 C) (Oral)   LMP 12/17/2021  Gen: NAD Dilation: 5 Effacement (%): 90 Cervical Position: Anterior Station: -2 Presentation: Vertex Exam by:: Lamont Snowball, SNM  Assessment and Plan: 32 y.o. G2P1001 [redacted]w[redacted]d elective IOL.   Labor: Elective IOL, with progress to 5 cm with pitocin. Anticipate entering active labor with continued progress, consider AROM, deferred for now per patient request.  -- pain control: well controlled with epidural infusing.  -- PPH Risk: low  Fetal Well-Being: EFW AGA. Cephalic by Leopolds, sutures on SVE.  -- Category 1 - reassuring fetal monitoring  -- GBS negative   Lamont Snowball Student Nurse Midwife 1:23 PM

## 2022-09-18 NOTE — Discharge Summary (Signed)
Postpartum Discharge Summary  Date of Service updated***     Patient Name: Nancy CoderJaleesa Garrett DOB: May 09, 1991 MRN: [id number]  Date of admission: 09/18/2022 Delivery date:09/18/2022  Delivering provider: Lamont SnowballWARREN-HILL, SARA  Date of discharge: 09/18/2022  Admitting diagnosis: Encounter for induction of labor [Z34.90] Intrauterine pregnancy: 5418w2d     Secondary diagnosis:  Principal Problem:   Encounter for induction of labor  Additional problems: None    Discharge diagnosis: Term Pregnancy Delivered                                              Post partum procedures: None Augmentation: Pitocin Complications: None  Hospital course: Induction of Labor With Vaginal Delivery   32 y.o. yo G2P2002 at 5118w2d was admitted to the hospital 09/18/2022 for induction of labor.  Indication for induction: Elective.  Patient had an uncomplicated labor course Membrane Rupture Time/Date: 1:40 PM ,09/18/2022   Delivery Method:Vaginal, Spontaneous  Episiotomy: None  Lacerations:  1st degree;Perineal  Details of delivery can be found in separate delivery note.  Patient had a postpartum course complicated by***. Patient is discharged home 09/18/22.  Newborn Data: Birth date:09/18/2022  Birth time:3:27 PM  Gender:Female  Living status:Living  Apgars:9 ,9  Weight:3360 g   Magnesium Sulfate received: No BMZ received: No Rhophylac:N/A MMR:N/A T-DaP: Declined Flu: No Transfusion:No  Physical exam  Vitals:   09/18/22 1831 09/18/22 1847 09/18/22 1901 09/18/22 1916  BP: 115/74 115/77 110/82 123/78  Pulse: 73 73 74 72  Temp:      TempSrc:       General: {Exam; general:21111117} Lochia: {Desc; appropriate/inappropriate:30686::"appropriate"} Uterine Fundus: {Desc; firm/soft:30687} Incision: {Exam; incision:21111123} DVT Evaluation: {Exam; dvt:2111122} Labs: Lab Results  Component Value Date   WBC 10.8 (H) 09/18/2022   HGB 9.7 (L) 09/18/2022   HCT 32.4 (L) 09/18/2022   MCV 80.6 09/18/2022   PLT  177 09/18/2022      Latest Ref Rng & Units 09/15/2022    4:17 PM  CMP  Glucose 70 - 99 mg/dL 409115   BUN 6 - 20 mg/dL 10   Creatinine 8.110.44 - 1.00 mg/dL 9.140.51   Sodium 782135 - 956145 mmol/L 134   Potassium 3.5 - 5.1 mmol/L 3.9   Chloride 98 - 111 mmol/L 100   CO2 22 - 32 mmol/L 22   Calcium 8.9 - 10.3 mg/dL 9.3   Total Protein 6.5 - 8.1 g/dL 6.1   Total Bilirubin 0.3 - 1.2 mg/dL 0.7   Alkaline Phos 38 - 126 U/L 172   AST 15 - 41 U/L 17   ALT 0 - 44 U/L 12    Edinburgh Score:     No data to display           After visit meds:  Allergies as of 09/18/2022       Reactions   Iodinated Contrast Media    Ivp Dye [iodinated Contrast Media] Nausea And Vomiting     Med Rec must be completed prior to using this [facility]***        Discharge home in stable condition Infant Feeding: Breast Infant Disposition:home with mother Discharge instruction: per After Visit Summary and Postpartum booklet. Activity: Advance as tolerated. Pelvic rest for 6 weeks.  Diet: routine diet Future Appointments: Future Appointments  Date Time Provider Department Center  10/08/2022 10:45 AM [facility]  10/12/2022 10:30  AM Myra Rude, MD [facility]  10/26/2022 10:00 AM Ngetich, Donalee Citrin, NP P[facility]-PSC None  12/09/2022 10:00 AM Tobb, Lavona Mound, DO [facility] None  04/14/2023  1:30 PM Ocie Doyne, MD [facility] None   Follow up Visit: Message sent by Thalia Bloodgood, CNM on 09/18/2022  Please schedule this patient for Postpartum visit in: 4 weeks with the following provider: Any provider In-Person For C/S patients schedule nurse incision check in weeks 2 weeks: no High risk pregnancy complicated by: obesity, hx of gastric sleeve, marginal cord Delivery mode:  SVD Anticipated Birth Control:  uncertain: interval tubal or partner vasectomy PP Procedures needed: None  Edinburgh: negative Schedule Integrated BH visit: no  No relevant baby issues   09/18/2022 Calvert Cantor, CNM

## 2022-09-18 NOTE — Lactation Note (Signed)
This note was copied from a baby's chart. Lactation Consultation Note  Patient Name: Girl Ayaan Goodell MOLMB'E Date: 09/18/2022 Age:33 hours Reason for consult: Initial assessment;Term  P2, term female infant. Per Birth Parent she feels breastfeeding is going well. Infant BF twice and recent feeding was 30 minutes in length, LC did not observe latch with recent feeding. Birth Parent is experienced in BF see maternal data below. Birth Parent will continue to BF by cues, on demand, 8 to 12+ times within 24 hours, skin to skin. LC discussed infant's input and output. Importance of maternal rest, diet and hydration. Birth Parent was made aware of O/P services, breastfeeding support groups, community resources, and our phone # for post-discharge questions.    Maternal Data Has patient been taught Hand Expression?: Yes Does the patient have breastfeeding experience prior to this delivery?: Yes How long did the patient breastfeed?: Per Birth Parent, she BF her 1st child for 18 months who is currently 25 years old.  Feeding Mother's Current Feeding Choice: Breast Milk  LATCH Score  LC did not observe latch.                  Lactation Tools Discussed/Used    Interventions Interventions: Breast feeding basics reviewed;LC Services brochure;Hand express;Education  Discharge Pump: Hands Free;DEBP (Per Birth Parent, she has [place] DEBP at home.)  Consult Status Consult Status: Follow-up Date: 09/19/22 Follow-up type: In-patient    Frederico Hamman 09/18/2022, 9:31 PM

## 2022-09-18 NOTE — H&P (Addendum)
LABOR AND DELIVERY ADMISSION HISTORY AND PHYSICAL NOTE  Nancy Garrett is a 32 y.o. female G2P1001 with IUP at 4614w2d by LMP presenting for elective IOL.   She reports positive fetal movement. She denies leakage of fluid, vaginal bleeding, or contractions.   She plans on breast feeding. Her contraception plan is: possible vasectomy or BTL.  [facility]  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  H/O gastric sleeve. H/O GDM, previous pregnancy.  H/O Preeclampsia, previous pregnancy.  Worsening headaches. Marginal insertion of cord. Maternal iron deficiency anemia.   Past Medical History: Past Medical History:  Diagnosis Date   Chronic abdominal pain    Head ache     Past Surgical History: Past Surgical History:  Procedure Laterality Date   GASTRECTOMY  2019   Dr. Darcella GasmanSergio Quinones.   LAPAROSCOPIC GASTRIC SLEEVE RESECTION     TONSILLECTOMY      Obstetrical History: OB History     Gravida  2   Para  1   Term  1   Preterm      AB      Living  1      SAB      IAB      Ectopic      Multiple      Live Births  1           Social History: Social History   Socioeconomic History   Marital status: Married    Spouse name: Not on file   Number of children: Not on file   Years of education: Not on file   Highest education level: Not on file  Occupational History   Not on file  Tobacco Use   Smoking status: Never   Smokeless tobacco: Never  Vaping Use   Vaping Use: Never used  Substance and Sexual Activity   Alcohol use: Not Currently    Alcohol/week: 2.0 standard drinks of alcohol    Types: 2 Glasses of wine per week   Drug use: Never   Sexual activity: Yes  Other Topics Concern   Not on file  Social History Narrative   ** Merged History  Encounter **       Tobacco use, amount per day now: Past tobacco use, amount per day: How many years did you use tobacco: Alcohol use (drinks per week): Diet: Do you drink/eat things with caffeine: No Marital status:   Married                               What year    were you married? 2022 Do you live in a house, apartment, assisted living, condo, trailer, etc.? House Is it one or more stories? One How many persons live in your home? 3  Do you have pets in your home?( please list) No Highest Level of education c   ompleted? College Current or past profession: Associate ProfessorCosmetologist, Scientist, research (physical sciences)alon Owner. Do you exercise? No                                 Type and how often? Do you have a living will? Yes Do you have a DNR form?   No  If not, do you    want to discuss one? Do you have signed POA/HPOA forms?    No                    If so, please bring to you appointment  Do you have any difficulty bathing or dressing yourself? Yes Do you have any difficulty preparing food or eating? No Do you hav   e any difficulty managing your medications? No Do you have any difficulty managing your finances? No Do you have any difficulty affording your medications? No   Social Determinants of Health   Financial Resource Strain: Not on file  Food Insecurity: Food Insecurity Present (09/18/2022)   Hunger Vital Sign    Worried About Running Out of Food in the Last Year: Never true    Ran Out of Food in the Last Year: Sometimes true  Transportation Needs: No Transportation Needs (09/18/2022)   PRAPARE - Administrator, Civil ServiceTransportation    Lack of Transportation (Medical): No    Lack of Transportation (Non-Medical): No  Physical Activity: Not on file  Stress: Not on file  Social Connections: Not on file    Family History: Family History  Problem Relation Age of Onset   High blood pressure Mother    Diabetes Father    Diabetes Sister    Stroke Sister    High blood pressure Sister      Allergies: Allergies  Allergen Reactions   Iodinated Contrast Media    Ivp Dye [Iodinated Contrast Media] Nausea And Vomiting    Medications Prior to Admission  Medication Sig Dispense Refill Last Dose   aspirin EC 81 MG tablet Take 1 tablet (81 mg total) by mouth daily. Swallow whole. 90 tablet 3    ferrous sulfate (FERROUSUL) 325 (65 FE) MG tablet Take 1 tablet (325 mg total) by mouth every other day. 30 tablet 3    Prenatal Vit-Fe Fumarate-FA (MULTIVITAMIN-PRENATAL) 27-0.8 MG TABS tablet Take 1 tablet by mouth daily at 12 noon.      propranolol (INDERAL) 10 MG tablet Take 1 tablet (10 mg total) by mouth at bedtime. (Patient not taking: Reported on 09/17/2022) 90 tablet 1      Review of Systems  All systems reviewed and negative except as stated in HPI  Physical Exam Blood pressure 115/76, pulse 95, temperature 98.8 F (37.1 C), temperature source Oral, last menstrual period 12/17/2021. General appearance: alert, oriented, NAD Lungs: normal respiratory effort Heart: regular rate Abdomen: soft, non-tender; gravid, leopolds cephalic Extremities: No calf swelling or tenderness Presentation: cephalic by Leopolds, sutures on SVE.   Fetal monitoring: Baseline: 140 bpm, Variability: Good {> 6 bpm), Accelerations: Reactive, and Cat  1 Uterine activityFrequency: Every 5-6 minutes and Duration: 40-80 seconds, palpating mild   Dilation: 3 Effacement (%): Thick Station: -3 Exam by:: Mosetta Pigeonhelsea Evans RN  Prenatal labs: ABO, Rh: --/--/O POS (04/05 16100814) Antibody: NEG (04/05 96040814) Rubella: 1.07 (09/12 1047) RPR: Non Reactive (01/03 0852)  HBsAg: Negative (09/12 1047)  HIV: Non Reactive (01/03 0852)  GC/Chlamydia: negative   GBS: Negative/-- (03/13 1131)  2-hr GTT: WNL Genetic screening:  Low risk  Anatomy US: WNL  Prenatal Transfer Tool  Maternal Diabetes: No Genetic Screening: Normal Maternal Ultrasounds/Referrals: Other: Growth US for maternal H/O gastric bypass.  Fetal  Ultrasounds or other Referrals:  Referred to Materal Fetal Medicine  Maternal Substance Abuse:  No Significant Maternal Medications:  None Significant Maternal Lab Results: Group B Strep negative  Results for orders placed or  performed during the hospital encounter of 09/18/22 (from the past 24 hour(s))  Type and screen   Collection Time: 09/18/22  8:14 AM  Result Value Ref Range   ABO/RH(D) O POS    Antibody Screen NEG    Sample Expiration      09/21/2022,2359 Performed at [facility], [address], [place], [place] [address]   CBC   Collection Time: 09/18/22  8:15 AM  Result Value Ref Range   WBC 10.8 (H) 4.0 - 10.5 K/uL   RBC 4.02 3.87 - 5.11 MIL/uL   Hemoglobin 9.7 (L) 12.0 - 15.0 g/dL   HCT 71.6 (L) 96.7 - 89.3 %   MCV 80.6 80.0 - 100.0 fL   MCH 24.1 (L) 26.0 - 34.0 pg   MCHC 29.9 (L) 30.0 - 36.0 g/dL   RDW 81.0 (H) 17.5 - 10.2 %   Platelets 177 150 - 400 K/uL   nRBC 0.0 0.0 - 0.2 %    Patient Active Problem List   Diagnosis Date Noted   Encounter for induction of labor 09/18/2022   Ulnar neuropathy at elbow of left upper extremity 07/07/2022   Lumbar radiculopathy 07/07/2022   Maternal iron deficiency anemia complicating pregnancy in second trimester 06/19/2022   Marginal insertion of umbilical cord affecting management of mother 05/20/2022   Worsening headaches 05/05/2022   ASCUS of cervix with negative high risk HPV 03/08/2022   Supervision of high risk pregnancy, antepartum 03/01/2022   Obesity in pregnancy, antepartum 02/28/2022   H/O gastric sleeve 02/28/2022   History of gestational diabetes 02/28/2022   Hx of preeclampsia, prior pregnancy, currently pregnant 02/28/2022   Facet degeneration of lumbar region 10/15/2021    Assessment: Nancy Garrett is a 32 y.o. G2P1001 at [redacted]w[redacted]d here for elective IOL.   #Labor: Latent phase of labor, plan pitocin for IOL, potential AROM when possible.  #Pain: IV pain meds PRN, epidural upon request. Supported by  husband, mother (and doula to arrive later).  #FWB: AGA #GBS/ID: negative #MOF: breast #MOC: vasectomy or BTL.   Lamont Snowball 09/18/2022, 9:19 AM    Attestation of Supervision of Student:  I confirm that I have verified the information documented in the nurse midwife student's note and that I have also personally  reviewed and coordinated  the history, physical exam and all medical decision making activities.  I have verified that all services and findings are accurately documented in this student's note; and I agree with management and plan as outlined in the documentation. I have also made any necessary editorial changes.  --BTL consent signed 06/22/2022, patient strongly considering partner vasectomy instead  Calvert Cantor, CNM [facility] [employer], [facility] 09/18/2022 11:54 AM

## 2022-09-18 NOTE — Anesthesia Procedure Notes (Signed)
Epidural Patient location during procedure: OB Start time: 09/18/2022 12:20 PM End time: 09/18/2022 12:26 PM  Staffing Anesthesiologist: Marcene Duos, MD Performed: anesthesiologist   Preanesthetic Checklist Completed: patient identified, IV checked, site marked, risks and benefits discussed, surgical consent, monitors and equipment checked, pre-op evaluation and timeout performed  Epidural Patient position: sitting Prep: DuraPrep and site prepped and draped Patient monitoring: continuous pulse ox and blood pressure Approach: midline Location: L4-L5 Injection technique: LOR air  Needle:  Needle type: Tuohy  Needle gauge: 17 G Needle length: 9 cm and 9 Needle insertion depth: 7 cm Catheter type: closed end flexible Catheter size: 19 Gauge Catheter at skin depth: 13 (12-->13 when sat upright) cm Test dose: negative  Assessment Events: blood not aspirated, no cerebrospinal fluid, injection not painful, no injection resistance, no paresthesia and negative IV test

## 2022-09-18 NOTE — Discharge Instructions (Signed)

## 2022-09-18 NOTE — Anesthesia Preprocedure Evaluation (Signed)
Anesthesia Evaluation  Patient identified by MRN, date of birth, ID band Patient awake    Reviewed: Allergy & Precautions, Patient's Chart, lab work & pertinent test results  Airway Mallampati: II  TM Distance: >3 FB     Dental   Pulmonary neg pulmonary ROS   Pulmonary exam normal        Cardiovascular negative cardio ROS Normal cardiovascular exam     Neuro/Psych  Neuromuscular disease    GI/Hepatic negative GI ROS, Neg liver ROS,,,  Endo/Other  negative endocrine ROS    Renal/GU negative Renal ROS     Musculoskeletal  (+) Arthritis ,    Abdominal   Peds  Hematology  (+) Blood dyscrasia, anemia   Anesthesia Other Findings   Reproductive/Obstetrics (+) Pregnancy                             Anesthesia Physical Anesthesia Plan  ASA: 3  Anesthesia Plan: Epidural   Post-op Pain Management:    Induction:   PONV Risk Score and Plan: 2 and Treatment may vary due to age or medical condition  Airway Management Planned: Natural Airway  Additional Equipment:   Intra-op Plan:   Post-operative Plan:   Informed Consent: I have reviewed the patients History and Physical, chart, labs and discussed the procedure including the risks, benefits and alternatives for the proposed anesthesia with the patient or authorized representative who has indicated his/her understanding and acceptance.       Plan Discussed with:   Anesthesia Plan Comments:        Anesthesia Quick Evaluation

## 2022-09-19 DIAGNOSIS — D62 Acute posthemorrhagic anemia: Secondary | ICD-10-CM | POA: Diagnosis not present

## 2022-09-19 LAB — CBC
HCT: 27.2 % — ABNORMAL LOW (ref 36.0–46.0)
Hemoglobin: 8.8 g/dL — ABNORMAL LOW (ref 12.0–15.0)
MCH: 25.5 pg — ABNORMAL LOW (ref 26.0–34.0)
MCHC: 32.4 g/dL (ref 30.0–36.0)
MCV: 78.8 fL — ABNORMAL LOW (ref 80.0–100.0)
Platelets: 150 10*3/uL (ref 150–400)
RBC: 3.45 MIL/uL — ABNORMAL LOW (ref 3.87–5.11)
RDW: 15.8 % — ABNORMAL HIGH (ref 11.5–15.5)
WBC: 14 10*3/uL — ABNORMAL HIGH (ref 4.0–10.5)
nRBC: 0 % (ref 0.0–0.2)

## 2022-09-19 MED ORDER — FERROUS SULFATE 325 (65 FE) MG PO TABS
325.0000 mg | ORAL_TABLET | ORAL | Status: DC
  Administered 2022-09-19: 325 mg via ORAL
  Filled 2022-09-19: qty 1

## 2022-09-19 NOTE — Anesthesia Postprocedure Evaluation (Signed)
Anesthesia Post Note  Patient: Samaire Shiffler  Procedure(s) Performed: AN AD HOC LABOR EPIDURAL     Patient location during evaluation: Mother Baby Anesthesia Type: Epidural Level of consciousness: awake and alert Pain management: pain level controlled Vital Signs Assessment: post-procedure vital signs reviewed and stable Respiratory status: spontaneous breathing, nonlabored ventilation and respiratory function stable Cardiovascular status: stable Postop Assessment: no headache, no backache and epidural receding Anesthetic complications: no   No notable events documented.  Last Vitals:  Vitals:   09/19/22 0540 09/19/22 1007  BP: 111/70 102/60  Pulse: 72 83  Resp: 18 17  Temp: 36.7 C 36.8 C  SpO2: 100% 100%    Last Pain:  Vitals:   09/19/22 1007  TempSrc: Oral  PainSc: 4    Pain Goal:                   Rica Records

## 2022-09-19 NOTE — Progress Notes (Signed)
Post Partum Day #1 Subjective: no complaints, up ad lib, and tolerating PO; breastfeeding going okay- used donor milk most recently; no dizziness w ambulation; wants BTL or husband to have vasectomy for PP contraception  Objective: Blood pressure 111/70, pulse 72, temperature 98.1 F (36.7 C), temperature source Oral, resp. rate 18, last menstrual period 12/17/2021, SpO2 100 %, unknown if currently breastfeeding.  Physical Exam:  General: alert, cooperative, and no distress Lochia: appropriate Uterine Fundus: firm DVT Evaluation: No evidence of DVT seen on physical exam.  Recent Labs    09/18/22 0815 09/19/22 0507  HGB 9.7* 8.8*  HCT 32.4* 27.2*    Assessment/Plan: Plan for discharge tomorrow and Lactation consult Acute blood loss anemia> will start qod Fe   LOS: 1 day   Nancy Garrett, CNM 09/19/2022, 7:07 AM

## 2022-09-20 NOTE — Lactation Note (Signed)
This note was copied from a baby's chart. Lactation Consultation Note  Patient Name: Nancy Garrett Date: 09/20/2022 Age:33 hours Reason for consult: Follow-up assessment;Term;Infant weight loss;Mother's request;RN request (7 % weight loss,) Mom desires to latch the baby for positioning help.  LC assisted and reviewed latching with depth.  Worked on the depth and showing mom the reverse pressure.  Baby sluggish since it hasn't been that long since a feeding.  LC reviewed BF D/C teaching and the [facility] resources.   Maternal Data Has patient been taught Hand Expression?: Yes (several drops)  Feeding Mother's Current Feeding Choice: Breast Milk and Donor Milk Nipple Type: Extra Slow Flow  LATCH Score Latch: Grasps breast easily, tongue down, lips flanged, rhythmical sucking.  Audible Swallowing: A few with stimulation (increased to 2)  Type of Nipple: Everted at rest and after stimulation  Comfort (Breast/Nipple): Soft / non-tender  Hold (Positioning): Assistance needed to correctly position infant at breast and maintain latch.  LATCH Score: 8   Lactation Tools Discussed/Used    Interventions Interventions: Breast feeding basics reviewed;Assisted with latch;Skin to skin;Breast massage;Hand express;Reverse pressure;Breast compression;Adjust position;Support pillows;Position options;Education;LC Services brochure  Discharge Discharge Education: Engorgement and breast care;Warning signs for feeding baby Pump: Hands Free;DEBP  Consult Status Consult Status: Complete Date: 09/20/22    Kathrin Greathouse 09/20/2022, 2:54 PM

## 2022-09-22 ENCOUNTER — Telehealth: Payer: Self-pay | Admitting: Psychiatry

## 2022-09-22 DIAGNOSIS — R9389 Abnormal findings on diagnostic imaging of other specified body structures: Secondary | ICD-10-CM

## 2022-09-22 NOTE — Telephone Encounter (Signed)
The patient called and said that we were waiting until after she gave birth to have her get a MRI with contrast and she wanted to let us know that she has had her baby and is ready to do the MRI. She stated she is allergic to the contrast though and asked to be called to discuss.

## 2022-09-23 ENCOUNTER — Encounter: Payer: Commercial Managed Care - HMO | Admitting: Family Medicine

## 2022-09-23 NOTE — Addendum Note (Signed)
Addended by: Ocie Doyne on: 09/23/2022 08:50 AM   Modules accepted: Orders

## 2022-09-23 NOTE — Telephone Encounter (Signed)
I ordered the MRI with contrast for her. It looks like she is allergic to the iodinated dye they give in CT scans. This is different from the dye they give during MRIs, so she should be fine as long as she has not had a reaction to MRI contrast before

## 2022-09-24 ENCOUNTER — Telehealth (HOSPITAL_COMMUNITY): Payer: Self-pay

## 2022-09-24 NOTE — Telephone Encounter (Signed)
Sending the patient Dr. Quentin Mulling message.

## 2022-09-24 NOTE — Telephone Encounter (Signed)
Chart review.

## 2022-09-28 ENCOUNTER — Encounter: Payer: Self-pay | Admitting: *Deleted

## 2022-09-28 ENCOUNTER — Encounter: Payer: PRIVATE HEALTH INSURANCE | Admitting: Family

## 2022-09-28 ENCOUNTER — Telehealth (HOSPITAL_COMMUNITY): Payer: Self-pay | Admitting: *Deleted

## 2022-09-28 NOTE — Telephone Encounter (Signed)
Patient stated, "I'm still having trouble with my left leg." When asked, patient told this RN that her leg was "the last thing to go numb after my epidural. Then it stayed numb. It was the next day before I got out of bed. And now I'm still dragging my left foot." RN instructed patient to contact her OB. Patient verbalized understanding. Patient voiced no other questions or concerns regarding her health at this time. EPDS=0. Patient voiced no questions or concerns regarding infant at this time. RN reviewed ABCs of safe sleep. Patient verbalized understanding. Patient requested RN email information on hospital's virtual postpartum classes and support groups. Email sent. Deforest Hoyles, RN, 09/28/22, [phone]

## 2022-09-30 ENCOUNTER — Telehealth: Payer: Self-pay | Admitting: Psychiatry

## 2022-09-30 NOTE — Telephone Encounter (Signed)
[employer] sent to GI they obtain Abbie Sons medicaid NPR [phone]

## 2022-10-08 ENCOUNTER — Ambulatory Visit (INDEPENDENT_AMBULATORY_CARE_PROVIDER_SITE_OTHER): Payer: Commercial Managed Care - HMO | Admitting: Clinical

## 2022-10-08 DIAGNOSIS — F4322 Adjustment disorder with anxiety: Secondary | ICD-10-CM | POA: Diagnosis not present

## 2022-10-08 NOTE — Patient Instructions (Signed)
[facility] [address] [place], [place] [address] [phone] (main office) [phone] (Windell Musson's office)  New Parent Support Groups [email or web address]   

## 2022-10-08 NOTE — BH Specialist Note (Signed)
Integrated Behavioral Health via Telemedicine Visit  10/08/2022 Nancy Garrett [id number]  Number of Integrated Behavioral Health Clinician visits: Additional Visit  Session Start time: 1047   Session End time: 1123  Total time in minutes: 36   Referring Provider: Gerrit Heck, CNM Patient/Family location: Family home/[facility] Provider location: [facility]  All persons participating in visit: [name] and [facility] Nancy Garrett   Types of Service: Individual psychotherapy and Video visit  I connected with Nancy Garrett and/or Nancy Garrett  n/a  via  Telephone or Video Enabled Telemedicine Application  (Video is Caregility application) and verified that I am speaking with the correct person using two identifiers. Discussed confidentiality: Yes   I discussed the limitations of telemedicine and the availability of in person appointments.  Discussed there is a possibility of technology failure and discussed alternative modes of communication if that failure occurs.  I discussed that engaging in this telemedicine visit, they consent to the provision of behavioral healthcare and the services will be billed under their insurance.  Patient and/or legal guardian expressed understanding and consented to Telemedicine visit: Yes   Presenting Concerns: Patient and/or family reports the following symptoms/concerns: Processing positive birthing experience, followed by stress over having to change pediatricians due to new vaccine policy; concern about continued painful uterine contractions (once daily, followed by discharge) and limitations with left leg (still "dragging it")postpartum; pt is feeling well supported by family.  Duration of problem: Postpartum; Severity of problem: mild  Patient and/or Family's Strengths/Protective Factors: Social connections, Concrete supports in place (healthy food, safe  environments, etc.), Sense of purpose, and Physical Health (exercise, healthy diet, medication compliance, etc.)  Goals Addressed: Patient will:  Maintain reduction of symptoms of: anxiety    Demonstrate ability to: Increase healthy adjustment to current life circumstances and Increase adequate support systems for patient/family  Progress towards Goals: Ongoing  Interventions: Interventions utilized:  Link to [employer] and Supportive Reflection Standardized Assessments completed: Not Needed  Patient and/or Family Response: Patient agrees with treatment plan.   Assessment: Patient currently experiencing Adjustment disorder with anxious mood.   Patient may benefit from continued therapeutic interventions.  Plan: Follow up with behavioral health clinician on : Three weeks Behavioral recommendations:  -Continue prioritizing healthy self-care (regular meals, adequate rest; allowing practical help from supportive friends and family) until at least postpartum medical appointment -Consider new mom support group as needed at either [email or web address] or [email or web address]  -Continue plan to discuss concerns about leg and uterine contractions at upcoming medical visits Referral(s): Integrated Art gallery manager (In Clinic) and [employer]:  new parent support  I discussed the assessment and treatment plan with the patient and/or parent/guardian. They were provided an opportunity to ask questions and all were answered. They agreed with the plan and demonstrated an understanding of the instructions.   They were advised to call back or seek an in-person evaluation if the symptoms worsen or if the condition fails to improve as anticipated.  [name], LCSW     08/19/2022   11:15 AM 06/17/2022    9:27 AM 03/09/2022    9:36 AM 02/24/2022   10:22 AM  Depression screen PHQ 2/9  Decreased Interest 0 0 0 0  Down, Depressed, Hopeless 0 0 0 0  PHQ - 2 Score 0 0  0 0  Altered sleeping 0 Tired, decreased energy 2 0 2 3  Change in appetite  0 0 0 3  Feeling bad or failure about yourself  0 0 0 0  Trouble concentrating 0 0 1 1  Moving slowly or fidgety/restless 0 0 0 2  Suicidal thoughts 0 0 0 0  PHQ-9 Score 08/19/2022   11:15 AM 06/17/2022    9:27 AM 06/16/2022    9:56 AM 03/09/2022    9:36 AM  GAD 7 : Generalized Anxiety Score  Nervous, Anxious, on Edge 0 1 1 0  Control/stop worrying 0 0 2 0  Worry too much - different things 0 0 0 0  Trouble relaxing 0 1 1 0  Restless 0 0 0 0  Easily annoyed or irritable 0  Afraid - awful might happen 0 0 1 0  Total GAD 7 Score 0       09/28/2022    3:49 PM 09/18/2022   10:15 PM  Edinburgh Postnatal Depression Scale Screening Tool  I have been able to laugh and see the funny side of things. 0 0  I have looked forward with enjoyment to things. 0 0  I have blamed myself unnecessarily when things went wrong. 0 1  I have been anxious or worried for no good reason. 0 0  I have felt scared or panicky for no good reason. 0 0  Things have been getting on top of me. 0 1  I have been so unhappy that I have had difficulty sleeping. 0 0  I have felt sad or miserable. 0 0  I have been so unhappy that I have been crying. 0 0  The thought of harming myself has occurred to me. 0 0  Edinburgh Postnatal Depression Scale Total 0 2

## 2022-10-12 ENCOUNTER — Ambulatory Visit (INDEPENDENT_AMBULATORY_CARE_PROVIDER_SITE_OTHER): Payer: Commercial Managed Care - HMO | Admitting: Family Medicine

## 2022-10-12 VITALS — BP 110/70 | Ht 66.0 in | Wt 242.0 lb

## 2022-10-12 DIAGNOSIS — M5416 Radiculopathy, lumbar region: Secondary | ICD-10-CM | POA: Diagnosis not present

## 2022-10-12 NOTE — Patient Instructions (Signed)
Good to see you Please use heat as needed  You can try going back to physical therapy  We'll get the MRI at [facility] imaging   Please send me a message in MyChart with any questions or updates.  We'll setup a virtual visit once the MRI is resulted.   --Dr. Jordan Likes

## 2022-10-12 NOTE — Assessment & Plan Note (Signed)
Acute on chronic in nature.  Symptoms been ongoing for about a year now.  Continues to have leg leg pain.  She has completed greater than 6 weeks of physician directed home exercise therapy.  This included lower trunk rotation and double knee-to-chest that were performed 10-20 times and held for 5 seconds on a daily basis.  Her symptoms did not improve with these exercises.  She also was initiated to physical therapy.  Previous imaging has been unrevealing. -Counseled on home exercise therapy and supportive care. -MRI of the lumbar spine evaluate for nerve impingement and consideration of epidural use.

## 2022-10-12 NOTE — Progress Notes (Signed)
  Nancy Garrett - 32 y.o. female MRN [id number]  Date of birth: 08-26-1990  SUBJECTIVE:  Including CC & ROS.  No chief complaint on file.   Nancy Garrett is a 32 y.o. female that is presenting with acute on chronic left-sided radicular pain.  The pain is going down the left leg.  She continues to have pain.  She recently had her baby on 4/5.  She had some prolonged left leg weakness after this procedure.  Pain is worse with prolonged walking and weightbearing.    Review of Systems See HPI   HISTORY: Past Medical, Surgical, Social, and Family History Reviewed & Updated per EMR.   Pertinent Historical Findings include:  Past Medical History:  Diagnosis Date   Chronic abdominal pain    Head ache     Past Surgical History:  Procedure Laterality Date   GASTRECTOMY  2019   Dr. Darcella Gasman.   LAPAROSCOPIC GASTRIC SLEEVE RESECTION     TONSILLECTOMY       PHYSICAL EXAM:  VS: BP 110/70   Ht 5\' 6"  (1.676 m)   Wt 242 lb (109.8 kg)   LMP 12/17/2021   BMI 39.06 kg/m  Physical Exam Gen: NAD, alert, cooperative with exam, well-appearing MSK:  Back/left leg:  Limited back flexion and extension. Weakness with resistance to hip flexion and extension. Diminished deep tendon reflexes at the patella. Positive straight leg raise Neurovascularly intact       ASSESSMENT & PLAN:   Lumbar radiculopathy Acute on chronic in nature.  Symptoms been ongoing for about a year now.  Continues to have leg leg pain.  She has completed greater than 6 weeks of physician directed home exercise therapy.  This included lower trunk rotation and double knee-to-chest that were performed 10-20 times and held for 5 seconds on a daily basis.  Her symptoms did not improve with these exercises.  She also was initiated to physical therapy.  Previous imaging has been unrevealing. -Counseled on home exercise therapy and supportive care. -MRI of the lumbar spine evaluate for nerve impingement and  consideration of epidural use.

## 2022-10-16 ENCOUNTER — Encounter: Payer: Self-pay | Admitting: Family Medicine

## 2022-10-16 NOTE — BH Specialist Note (Signed)
Integrated Behavioral Health via Telemedicine Visit  10/16/2022 Nancy Garrett [id number]  Number of Integrated Behavioral Health Clinician visits: Additional Visit  Session Start time: 1047   Session End time: 1123  Total time in minutes: 36   Referring Provider: Gerrit Heck, CNM Patient/Family location: Home [facility] Provider location: [facility]  All persons participating in visit: [name] and [facility] Balinda Heacock   Types of Service: Individual psychotherapy and Video visit  I connected with Laurell Roof and/or Dennison Mascot Alonzo's  daughter(s)  via  Telephone or Video Enabled Telemedicine Application  (Video is Caregility application) and verified that I am speaking with the correct person using two identifiers. Discussed confidentiality: Yes   I discussed the limitations of telemedicine and the availability of in person appointments.  Discussed there is a possibility of technology failure and discussed alternative modes of communication if that failure occurs.  I discussed that engaging in this telemedicine visit, they consent to the provision of behavioral healthcare and the services will be billed under their insurance.  Patient and/or legal guardian expressed understanding and consented to Telemedicine visit: Yes   Presenting Concerns: Patient and/or family reports the following symptoms/concerns: Grieving loss of father's best friend, as well as adjusting to baby's feeding/pumping schedule, while preparing to go back to work next week.  Duration of problem: Postpartum; Severity of problem: moderate  Patient and/or Family's Strengths/Protective Factors: Social connections, Social and Emotional competence, Concrete supports in place (healthy food, safe environments, etc.), Sense of purpose, and Physical Health (exercise, healthy diet, medication compliance, etc.)  Goals Addressed: Patient  will:  Reduce symptoms of: anxiety, depression, and stress   Increase knowledge and/or ability of: stress reduction   Demonstrate ability to: Increase healthy adjustment to current life circumstances and Continue healthy grieving over loss  Progress towards Goals: Ongoing  Interventions: Interventions utilized:  Supportive Reflection Standardized Assessments completed: Not Needed  Patient and/or Family Response: Patient agrees with treatment plan.   Assessment: Patient currently experiencing Adjustment disorder with anxiety; Grief.   Patient may benefit from continued therapeutic interventions.  Plan: Follow up with behavioral health clinician on : Three weeks Behavioral recommendations:  -Continue prioritizing healthy self-care (regular meals, adequate rest; allowing practical help from supportive friends and family)  -Consider new mom support group as needed at either [email or web address] or [email or web address]  -Continue setting healthy boundaries with others -Continue plan to start back part-time at work; allowing time to pump as needed throughout the day and kindness to self Referral(s): Integrated Art gallery manager (In Clinic) and [employer]:  new mom support  I discussed the assessment and treatment plan with the patient and/or parent/guardian. They were provided an opportunity to ask questions and all were answered. They agreed with the plan and demonstrated an understanding of the instructions.   They were advised to call back or seek an in-person evaluation if the symptoms worsen or if the condition fails to improve as anticipated.  [name], LCSW     08/19/2022   11:15 AM 06/17/2022    9:27 AM 03/09/2022    9:36 AM 02/24/2022   10:22 AM  Depression screen PHQ 2/9  Decreased Interest 0 0 0 0  Down, Depressed, Hopeless 0 0 0 0  PHQ - 2 Score 0 0 0 0  Altered sleeping 0 1 3 3   Tired, decreased energy 2 0 2 3  Change in appetite 0 0 0 3   Feeling  bad or failure about yourself  0 0 0 0  Trouble concentrating 0 0 1 1  Moving slowly or fidgety/restless 0 0 0 2  Suicidal thoughts 0 0 0 0  PHQ-9 Score 2 1 6 12       08/19/2022   11:15 AM 06/17/2022    9:27 AM 06/16/2022    9:56 AM 03/09/2022    9:36 AM  GAD 7 : Generalized Anxiety Score  Nervous, Anxious, on Edge 0 1 1 0  Control/stop worrying 0 0 2 0  Worry too much - different things 0 0 0 0  Trouble relaxing 0 1 1 0  Restless 0 0 0 0  Easily annoyed or irritable 1 1 1  0  Afraid - awful might happen 0 0 1 0  Total GAD 7 Score 1 3 6  0       10/22/2022   10:43 AM 09/28/2022    3:49 PM 09/18/2022   10:15 PM  Edinburgh Postnatal Depression Scale Screening Tool  I have been able to laugh and see the funny side of things. 0 0 0  I have looked forward with enjoyment to things. 0 0 0  I have blamed myself unnecessarily when things went wrong. 0 0 1  I have been anxious or worried for no good reason. 0 0 0  I have felt scared or panicky for no good reason. 0 0 0  Things have been getting on top of me. 0 0 1  I have been so unhappy that I have had difficulty sleeping. 0 0 0  I have felt sad or miserable. 0 0 0  I have been so unhappy that I have been crying. 0 0 0  The thought of harming myself has occurred to me. 0 0 0  Edinburgh Postnatal Depression Scale Total 0 0 2

## 2022-10-17 ENCOUNTER — Other Ambulatory Visit: Payer: Commercial Managed Care - HMO

## 2022-10-22 ENCOUNTER — Ambulatory Visit (INDEPENDENT_AMBULATORY_CARE_PROVIDER_SITE_OTHER): Payer: Commercial Managed Care - HMO | Admitting: Family Medicine

## 2022-10-22 ENCOUNTER — Encounter: Payer: Self-pay | Admitting: Family Medicine

## 2022-10-22 NOTE — Progress Notes (Signed)
Post Partum Visit Note  Nancy Garrett is a 32 y.o. G59P2002 female who presents for a postpartum visit. She is 4 weeks postpartum following a normal spontaneous vaginal delivery.  I have fully reviewed the prenatal and intrapartum course. The delivery was at 39 gestational weeks.  Anesthesia: epidural. Postpartum course has been normal. Baby is doing well. Baby is feeding by both breast and bottle - Kendamil . Bleeding staining only. Bowel function is normal. Bladder function is normal. Patient is not sexually active. Contraception method is  undecided . Postpartum depression screening: negative.   The pregnancy intention screening data noted above was reviewed. Potential methods of contraception were discussed. The patient elected to proceed with No data recorded.   Edinburgh Postnatal Depression Scale - 10/22/22 1043       Edinburgh Postnatal Depression Scale:  In the Past 7 Days   I have been able to laugh and see the funny side of things. 0    I have looked forward with enjoyment to things. 0    I have blamed myself unnecessarily when things went wrong. 0    I have been anxious or worried for no good reason. 0    I have felt scared or panicky for no good reason. 0    Things have been getting on top of me. 0    I have been so unhappy that I have had difficulty sleeping. 0    I have felt sad or miserable. 0    I have been so unhappy that I have been crying. 0    The thought of harming myself has occurred to me. 0    Edinburgh Postnatal Depression Scale Total 0             Health Maintenance Due  Topic Date Due   DTaP/Tdap/Td (1 - Tdap) Never done    The following portions of the patient's history were reviewed and updated as appropriate: allergies, current medications, past family history, past medical history, past social history, past surgical history, and problem list.  Review of Systems Pertinent items noted in HPI and remainder of comprehensive ROS otherwise  negative.  Objective:  BP 97/72   Pulse 93   Wt 244 lb (110.7 kg)   LMP 12/17/2021   Breastfeeding Yes   BMI 39.38 kg/m    General:  alert, cooperative, and appears stated age  Lungs: Normal effort  Heart:  regular rate and rhythm  Abdomen: soft, non-tender; bowel sounds normal; no masses,  no organomegaly   GU exam:  not indicated       Assessment:    Normal postpartum exam.   Plan:   Essential components of care per ACOG recommendations:  1.  Mood and well being: Patient with negative depression screening today. Reviewed local resources for support.  - Patient tobacco use? No.   - hx of drug use? No.    2. Infant care and feeding:  -Patient currently breastmilk feeding? Yes. Discussed returning to work and pumping.  -Social determinants of health (SDOH) reviewed in EPIC. No concerns  3. Sexuality, contraception and birth spacing - Patient does not want a pregnancy in the next year.  Desired family size is 3 children.  - Reviewed reproductive life planning. Reviewed contraceptive methods based on pt preferences and effectiveness.  Patient desired Withdrawal or Other Method today.   - Discussed birth spacing of 18 months  4. Sleep and fatigue -Encouraged family/partner/community support of 4 hrs of uninterrupted sleep to  help with mood and fatigue  5. Physical Recovery  - Discussed patients delivery and complications. She describes her labor as good. - Patient had a Vaginal, no problems at delivery. Patient had no laceration. Perineal healing reviewed. Patient expressed understanding - Patient has urinary incontinence? No. - Patient is safe to resume physical and sexual activity  6.  Health Maintenance - HM due items addressed Yes - Last pap smear  Diagnosis  Date Value Ref Range Status  02/24/2022 (A)  Final   - Atypical squamous cells of undetermined significance (ASC-US)   Pap smear not done at today's visit.  -Breast Cancer screening indicated? No.   7.  Chronic Disease/Pregnancy Condition follow up: None  - PCP follow up  Reva Bores, MD [facility] for [facility], [facility]

## 2022-10-26 ENCOUNTER — Encounter: Payer: Self-pay | Admitting: Family

## 2022-10-26 ENCOUNTER — Ambulatory Visit (INDEPENDENT_AMBULATORY_CARE_PROVIDER_SITE_OTHER): Payer: Commercial Managed Care - HMO | Admitting: Family

## 2022-10-26 VITALS — BP 116/72 | HR 86 | Temp 98.6°F | Ht 66.0 in | Wt 247.6 lb

## 2022-10-26 DIAGNOSIS — B353 Tinea pedis: Secondary | ICD-10-CM | POA: Diagnosis not present

## 2022-10-26 DIAGNOSIS — M545 Low back pain, unspecified: Secondary | ICD-10-CM

## 2022-10-26 DIAGNOSIS — Z6839 Body mass index (BMI) 39.0-39.9, adult: Secondary | ICD-10-CM

## 2022-10-26 DIAGNOSIS — Z0000 Encounter for general adult medical examination without abnormal findings: Secondary | ICD-10-CM

## 2022-10-26 DIAGNOSIS — D509 Iron deficiency anemia, unspecified: Secondary | ICD-10-CM

## 2022-10-26 DIAGNOSIS — G8929 Other chronic pain: Secondary | ICD-10-CM

## 2022-10-26 DIAGNOSIS — G43009 Migraine without aura, not intractable, without status migrainosus: Secondary | ICD-10-CM

## 2022-10-26 LAB — CBC WITH DIFFERENTIAL/PLATELET
Absolute Monocytes: 363 cells/uL (ref 200–950)
Basophils Absolute: 22 cells/uL (ref 0–200)
Basophils Relative: 0.3 %
Eosinophils Absolute: 59 cells/uL (ref 15–500)
Eosinophils Relative: 0.8 %
HCT: 36.5 % (ref 35.0–45.0)
Hemoglobin: 11.3 g/dL — ABNORMAL LOW (ref 11.7–15.5)
Lymphs Abs: 2886 cells/uL (ref 850–3900)
MCH: 24.5 pg — ABNORMAL LOW (ref 27.0–33.0)
MCHC: 31 g/dL — ABNORMAL LOW (ref 32.0–36.0)
MCV: 79.2 fL — ABNORMAL LOW (ref 80.0–100.0)
MPV: 12 fL (ref 7.5–12.5)
Monocytes Relative: 4.9 %
Neutro Abs: 4070 cells/uL (ref 1500–7800)
Neutrophils Relative %: 55 %
Platelets: 201 10*3/uL (ref 140–400)
RBC: 4.61 10*6/uL (ref 3.80–5.10)
RDW: 17.7 % — ABNORMAL HIGH (ref 11.0–15.0)
Total Lymphocyte: 39 %
WBC: 7.4 10*3/uL (ref 3.8–10.8)

## 2022-10-26 MED ORDER — TERBINAFINE HCL 1 % EX CREA
1.0000 | TOPICAL_CREAM | Freq: Two times a day (BID) | CUTANEOUS | 0 refills | Status: DC
Start: 2022-10-26 — End: 2023-10-13

## 2022-10-26 NOTE — Progress Notes (Signed)
Provider: Richarda Blade FNP-C   Maylene Crocker, Donalee Citrin, NP  Patient Care Team: Mell Mellott, Donalee Citrin, NP as PCP - General (Family Medicine) Thomasene Ripple, DO as PCP - Cardiology (Cardiology) Gerrit Heck, CNM as PCP - OBGYN (Obstetrics and Gynecology) Patient, No Pcp Per (General Practice)  Extended Emergency Contact Information Primary Emergency Contact: Modena Slater of [place] Home Phone: [phone] Relation: Grandmother Secondary Emergency Contact: Willette Alma [place] of [place] Home Phone: [phone] Relation: Mother  Code Status:  Full Code  Goals of care: Advanced Directive information    09/18/2022    8:55 AM  Advanced Directives  Does Patient Have a Medical Advance Directive? No  Would patient like information on creating a medical advance directive? No - Patient declined     Chief Complaint  Patient presents with   Annual Exam    Patient presents today for a physical    Quality Metric Gaps    TDAP    HPI:  Pt is a 32 y.o. female seen today for annual Physical exam.she is here with her 33 yr old and 25 weeks old daughters.states her delivery went well.Breast feeding the baby but states has had milk reduction compared to few weeks after delivery. Had episode of syncope " Passing out" during delivery.states usually has warning signs of feeling hot,then palpitation  and headache then passes out. Follows up Neurologist for migraine headache.Has up to three migraine per week.On Tylenol  as needed. 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 Also has MRI of the lumbar spine scheduled due to lower back pain with radiation to left leg. States dragging left leg.  Due for Tdap vaccine but would like to wait until done with breast feeding her baby then will get her Tdap.Does not plan to  immunize the baby until later.  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 Also complains of left foot skin peeling and itching thinks has fungal infection.Has been soaking in vinegar solution with warm water.  Past Medical History:  Diagnosis Date   Chronic abdominal pain    Head ache    Past Surgical History:  Procedure Laterality Date   GASTRECTOMY  2019   Dr. Darcella Gasman.   LAPAROSCOPIC GASTRIC SLEEVE RESECTION     TONSILLECTOMY      Allergies  Allergen Reactions   Iodinated Contrast Media    Ivp Dye [Iodinated Contrast Media] Nausea And Vomiting    Allergies as of 10/26/2022       Reactions   Iodinated Contrast Media    Ivp Dye [iodinated Contrast Media] Nausea And Vomiting        Medication List        Accurate as of Oct 26, 2022 10:50 AM. If you have any questions, ask your nurse or doctor.          ferrous sulfate 325 (65 FE) MG tablet Commonly known as: FerrouSul Take 1 tablet (325 mg total) by mouth every other day.   multivitamin-prenatal 27-0.8 MG Tabs tablet Take 1 tablet by mouth daily at 12 noon.        Review of Systems  Constitutional:  Negative for appetite change, chills, fatigue, fever and unexpected weight change.  HENT:  Negative  for congestion, dental problem, ear discharge, ear pain, facial swelling, hearing loss, nosebleeds, postnasal drip, rhinorrhea, sinus pressure, sinus pain, sneezing, sore throat, tinnitus and trouble swallowing.   Eyes:  Negative for pain, discharge, redness, itching and visual disturbance.  Respiratory:  Negative for cough, chest tightness, shortness of breath and wheezing.   Cardiovascular:  Negative for chest pain, palpitations and leg swelling.  Gastrointestinal:  Negative for abdominal distention, abdominal pain, blood in stool, constipation, diarrhea,  nausea and vomiting.  Endocrine: Negative for cold intolerance, heat intolerance, polydipsia, polyphagia and polyuria.  Genitourinary:  Negative for difficulty urinating, dysuria, flank pain, frequency and urgency.  Musculoskeletal:  Positive for back pain. Negative for arthralgias, gait problem, joint swelling, myalgias, neck pain and neck stiffness.  Skin:  Positive for rash. Negative for color change, pallor and wound.       Peeling itchy skin on left foot.   Neurological:  Negative for dizziness, syncope, speech difficulty, weakness, light-headedness, numbness and headaches.  Hematological:  Does not bruise/bleed easily.  Psychiatric/Behavioral:  Negative for agitation, behavioral problems, confusion, hallucinations and sleep disturbance. The patient is not nervous/anxious.     Immunization History  Administered Date(s) Administered   Hepatitis B 03/06/2002, 05/01/2002, 09/04/2002   Pertinent  Health Maintenance Due  Topic Date Due   INFLUENZA VACCINE  01/14/2023   PAP SMEAR-Modifier  02/24/2025      08/04/2020   10:58 PM 08/05/2020    9:40 AM 09/22/2021    9:59 AM 05/13/2022    4:54 PM  Fall Risk  Falls in the past year?   0   Was there an injury with Fall?   0   Fall Risk Category Calculator   0   Fall Risk Category (Retired)   Low   (RETIRED) Patient Fall Risk Level Low fall risk Low fall risk Low fall risk Low fall risk  Patient at Risk for Falls Due to   No Fall Risks   Fall risk Follow up   Falls evaluation completed    Functional Status Survey:    Vitals:   10/26/22 0951  BP: 116/72  Pulse: 86  Temp: 98.6 F (37 C)  SpO2: 99%  Weight: 247 lb 9.6 oz (112.3 kg)  Height: 5\' 6"  (1.676 m)   Body mass index is 39.96 kg/m. Physical Exam Constitutional:      Appearance: She is obese.  Feet:     Right foot:     Skin integrity: No ulcer, skin breakdown, erythema, warmth, callus or fissure.     Left foot:     Skin integrity: No ulcer, skin breakdown, erythema,  callus or fissure.     Toenail Condition: Fungal disease present.    Labs reviewed: Recent Labs    03/23/22 1146 06/17/22 0852 09/15/22 1617  NA 136 137 134*  K 4.7 4.3 3.9  CL 100 101 100  CO2 22 18* 22  GLUCOSE 81 69* 115*  BUN 7 12 10   CREATININE 0.60 0.40* 0.51  CALCIUM 9.6 9.4 9.3   Recent Labs    03/23/22 1146 06/17/22 0852 09/15/22 1617  AST 10 11 17   ALT 8 8 12   ALKPHOS 83 107 172*  BILITOT 0.4 0.3 0.7  PROT 6.8 6.9 6.1*  ALBUMIN 4.2 4.0 2.4*   Recent Labs    02/24/22 1047 05/13/22 1735 09/15/22 1617 09/18/22 0815 09/19/22 0507  WBC 8.9   < > 11.0* 10.8* 14.0*  NEUTROABS 5.9  --   --   --   --  HGB 12.9   < > 9.7* 9.7* 8.8*  HCT 39.2   < > 30.5* 32.4* 27.2*  MCV 85   < > 79.4* 80.6 78.8*  PLT 214   < > 158 177 150   < > = values in this interval not displayed.   Lab Results  Component Value Date   TSH 1.010 03/23/2022   Lab Results  Component Value Date   HGBA1C 5.6 02/24/2022   Lab Results  Component Value Date   CHOL 255 (H) 09/22/2021   HDL 70 09/22/2021   LDLCALC 170 (H) 09/22/2021   TRIG 49 09/22/2021   CHOLHDL 3.6 09/22/2021    Significant Diagnostic Results in last 30 days:  No results found.  Assessment/Plan 1. Iron deficiency anemia, unspecified iron deficiency anemia type Hgb 8.8 post delivery 6 weeks ago  - continue on Ferrous sulfate - Encouraged to increase  - will recheck labs  - CBC with Differential/Platelet  2. Tinea pedis of left foot Left lateral foot dark in color,with peeling skin. - continue to soak foot in warm water and vinegar,pat dry then apply Lamisil cream.  - Notify provider if symptoms worsen or fail to improve - terbinafine (LAMISIL AT) 1 % cream; Apply 1 Application topically 2 (two) times daily.  Dispense: 30 g; Refill: 0  3. BMI 39.0-39.9,adult BMI 39.96  - Dietary modification advised.exercise limited due to lower back pain with radiculopathy.  - Amb Ref to Medical Weight Management  4.  Chronic bilateral low back pain without sciatica Has MRI scheduled for 11/03/2022  - continue to follow up with DR.Clare Gandy   5. Migraine without aura and without status migrainosus, not intractable - Has upcoming appointment for MRI of the brain scheduled 11/24/2022  - continue to follow up with Dr. Ocie Doyne - continue on tylenol as needed not taking any other medication  6. Encounter for preventive health examination Immunization up to date except due for Tdap which she declined today would like to wait until she stops breast feeding her 74 weeks old baby.Medication and labs reviewed patient counselled regarding yearly exam, prevention of dental and periodontal disease and diet, regular sustained exercise limited due to lower back pain with radiation to left leg. recommended schedule for routine labs.  Family/ staff Communication: Reviewed plan of care with patient verbalized understanding   Labs/tests ordered:  - CBC with Differential/Platelet  Next Appointment : Return in about 1 year (around 10/26/2023) for annual Physical examination.   Caesar Bookman, NP

## 2022-10-30 ENCOUNTER — Ambulatory Visit: Payer: Commercial Managed Care - HMO | Admitting: Clinical

## 2022-10-30 DIAGNOSIS — F4322 Adjustment disorder with anxiety: Secondary | ICD-10-CM

## 2022-10-30 DIAGNOSIS — F4321 Adjustment disorder with depressed mood: Secondary | ICD-10-CM

## 2022-11-02 NOTE — BH Specialist Note (Signed)
Integrated Behavioral Health via Telemedicine Visit  11/16/2022 Nancy Garrett [id number]  Number of Integrated Behavioral Health Clinician visits: Additional Visit  Session Start time: 1318   Session End time: 1349  Total time in minutes: 31   Referring Provider: Gerrit Heck, CNM Patient/Family location: Home [facility] Provider [facility]: [facility]  All persons participating in visit: [name] and [name]   Types of Service: Psychotherapy; Video visit  I connected with Nancy Garrett and/or Nancy Garrett's  n/a  via  Telephone or Video Enabled Telemedicine Application  (Video is Caregility application) and verified that I am speaking with the correct person using two identifiers. Discussed confidentiality: Yes   I discussed the limitations of telemedicine and the availability of in person appointments.  Discussed there is a possibility of technology failure and discussed alternative modes of communication if that failure occurs.  I discussed that engaging in this telemedicine visit, they consent to the provision of behavioral healthcare and the services will be billed under their insurance.  Patient and/or legal guardian expressed understanding and consented to Telemedicine visit: Yes   Presenting Concerns: Patient and/or family reports the following symptoms/concerns: Processing feelings regarding lactation problems, 4 passing out episodes postpartum and husband's poor communication.  Duration of problem: Postpartum ; Severity of problem: moderate  Patient and/or Family's Strengths/Protective Factors: Social connections, Concrete supports in place (healthy food, safe environments, etc.), Sense of purpose, and Physical Health (exercise, healthy diet, medication compliance, etc.)  Goals Addressed: Patient will:  Reduce symptoms of: anxiety and stress   Increase knowledge and/or  ability of: stress reduction   Demonstrate ability to: Increase healthy adjustment to current life circumstances and Increase motivation to adhere to plan of care  Progress towards Goals: Ongoing  Interventions: Interventions utilized:  Supportive Reflection Standardized Assessments completed: Not Needed  Patient and/or Family Response: Patient agrees with treatment plan.   Assessment: Patient currently experiencing Adjustment disorder with anxious mood.   Patient may benefit from continued therapeutic intervention  .  Plan: Follow up with behavioral health clinician on : Two weeks Behavioral recommendations:  -Continue plan to use "[employer]" for improved milk production; notice any changes; discuss with PCP as needed -Continue efforts to schedule time with husband in the next two weeks, as discussed -Continue plan to attend upcoming appointments (MRI, Weight/Wellness, Cardiology, Neurology)  Referral(s): Integrated [employer] (In Clinic)  I discussed the assessment and treatment plan with the patient and/or parent/guardian. They were provided an opportunity to ask questions and all were answered. They agreed with the plan and demonstrated an understanding of the instructions.   They were advised to call back or seek an in-person evaluation if the symptoms worsen or if the condition fails to improve as anticipated.  Valetta Close [name], LCSW

## 2022-11-03 ENCOUNTER — Ambulatory Visit
Admission: RE | Admit: 2022-11-03 | Discharge: 2022-11-03 | Disposition: A | Discharge status: Home / Self Care | Payer: Commercial Managed Care - HMO | Source: Ambulatory Visit | Attending: Family Medicine | Admitting: Family Medicine

## 2022-11-03 DIAGNOSIS — M5416 Radiculopathy, lumbar region: Secondary | ICD-10-CM

## 2022-11-16 ENCOUNTER — Ambulatory Visit (INDEPENDENT_AMBULATORY_CARE_PROVIDER_SITE_OTHER): Payer: Commercial Managed Care - HMO | Admitting: Clinical

## 2022-11-16 DIAGNOSIS — F4322 Adjustment disorder with anxiety: Secondary | ICD-10-CM | POA: Diagnosis not present

## 2022-11-17 NOTE — BH Specialist Note (Signed)
Integrated Behavioral Health via Telemedicine Visit  12/01/2022 Aviva Attanasio [id number]  Number of Integrated Behavioral Health Clinician visits: Additional Visit  Session Start time: 0907   Session End time: 0951  Total time in minutes: 44   Referring Provider: Gerrit Heck, CNM Patient/Family location: Home [facility] Provider location: [facility]  All persons participating in visit: [name] and [facility] Tunisha Ruland   Types of Service: Individual psychotherapy and Telephone visit  I connected with Laurell Roof and/or Dennison Mascot Monrreal's  n/a  via  Telephone or Video Enabled Telemedicine Application  (Video is Caregility application) and verified that I am speaking with the correct person using two identifiers. Discussed confidentiality: Yes   I discussed the limitations of telemedicine and the availability of in person appointments.  Discussed there is a possibility of technology failure and discussed alternative modes of communication if that failure occurs.  I discussed that engaging in this telemedicine visit, they consent to the provision of behavioral healthcare and the services will be billed under their insurance.  Patient and/or legal guardian expressed understanding and consented to Telemedicine visit: Yes   Presenting Concerns: Patient and/or family reports the following symptoms/concerns: Recent increase in anxiety with panic and frustration, attributes to feeling overheated during stressful situations, including being turned away from medical appointment due to being 5 minutes late.  Duration of problem: recent increase; Severity of problem: moderate  Patient and/or Family's Strengths/Protective Factors: Social connections, Social and Emotional competence, Concrete supports in place (healthy food, safe environments, etc.), Sense of purpose, and Physical Health (exercise, healthy  diet, medication compliance, etc.)  Goals Addressed: Patient will:  Reduce symptoms of: anxiety   Increase knowledge and/or ability of: self-management skills   Demonstrate ability to: Increase healthy adjustment to current life circumstances  Progress towards Goals: Ongoing  Interventions: Interventions utilized:  Solution-Focused Strategies Standardized Assessments completed: GAD-7 and PHQ 9  Patient and/or Family Response: Patient agrees with treatment plan.   Assessment: Patient currently experiencing Adjustment disorder with anxiety.   Patient may benefit from continued therapeutic intervention  .  Plan: Follow up with behavioral health clinician on : Two weeks Behavioral recommendations:  -Continue plan to obtain mini fans this week; consider keeping cold water/water bottles on hand as well to help manage anxiety with overheating the next two weeks -Continue advocating for appropriate healthcare and following medical advice Referral(s): Integrated [employer] (In Clinic)  I discussed the assessment and treatment plan with the patient and/or parent/guardian. They were provided an opportunity to ask questions and all were answered. They agreed with the plan and demonstrated an understanding of the instructions.   They were advised to call back or seek an in-person evaluation if the symptoms worsen or if the condition fails to improve as anticipated.  [name], LCSW     12/01/2022    9:42 AM 08/19/2022   11:15 AM 06/17/2022    9:27 AM 03/09/2022    9:36 AM 02/24/2022   10:22 AM  Depression screen PHQ 2/9  Decreased Interest 0 0 0 0 0  Down, Depressed, Hopeless 0 0 0 0 0  PHQ - 2 Score 0 0 0 0 0  Altered sleeping 0 0 1 3 3   Tired, decreased energy 0 2 0 2 3  Change in appetite 0 0 0 0 3  Feeling bad or failure about yourself  0 0 0 0 0  Trouble concentrating 0 0 0  1 1  Moving slowly or fidgety/restless 0 0 0 0 2  Suicidal thoughts 0 0 0 0 0   PHQ-9 Score 0 2 1 6 12       12/01/2022    9:40 AM 08/19/2022   11:15 AM 06/17/2022    9:27 AM 06/16/2022    9:56 AM  GAD 7 : Generalized Anxiety Score  Nervous, Anxious, on Edge 1 0 1 1  Control/stop worrying 1 0 0 2  Worry too much - different things 1 0 0 0  Trouble relaxing 0 0 1 1  Restless 0 0 0 0  Easily annoyed or irritable 1 1 1 1   Afraid - awful might happen 1 0 0 1  Total GAD 7 Score 5 1 3  6

## 2022-11-24 ENCOUNTER — Other Ambulatory Visit: Payer: Commercial Managed Care - HMO

## 2022-11-24 ENCOUNTER — Telehealth: Payer: Self-pay

## 2022-11-24 MED ORDER — DIPHENHYDRAMINE HCL 50 MG PO TABS: 50.0000 mg | ORAL_TABLET | Freq: Once | ORAL | 0 refills | Status: DC

## 2022-11-24 MED ORDER — PREDNISONE 50 MG PO TABS: ORAL_TABLET | ORAL | 0 refills | Status: DC

## 2022-11-24 NOTE — Telephone Encounter (Signed)
Phone call to patient to review instructions for 13 hr prep for MRI w/ contrast on 12/07/22 at 11:10 AM. Prescription called into [facility]. Pt stated she was breastfeeding and asked if this medication was safe to take while breastfeeding. I advised the patient to reach out to her OBGYN for guidance. Pt aware and verbalized understanding of instructions.  Prescription: Pt to take 50 mg of prednisone on 12/06/22 at 10:10 PM, 50 mg of prednisone on 12/07/22 at 4:10 AM, and 50 mg of prednisone on 12/07/22 at 10:10 AM. Pt is also to take 50 mg of benadryl on 12/07/22 at 10:10 AM. Please call [phone] with any questions.   Benadryl also called in as a separate prescription at the patients request. Pt advised not to drive the day of taking this medication. Pt verbalized understanding.

## 2022-11-30 ENCOUNTER — Encounter (INDEPENDENT_AMBULATORY_CARE_PROVIDER_SITE_OTHER): Payer: Self-pay

## 2022-11-30 ENCOUNTER — Encounter (INDEPENDENT_AMBULATORY_CARE_PROVIDER_SITE_OTHER): Payer: Commercial Managed Care - HMO | Admitting: Physician Assistant

## 2022-12-01 ENCOUNTER — Ambulatory Visit (INDEPENDENT_AMBULATORY_CARE_PROVIDER_SITE_OTHER): Payer: Commercial Managed Care - HMO | Admitting: Clinical

## 2022-12-01 DIAGNOSIS — F4322 Adjustment disorder with anxiety: Secondary | ICD-10-CM

## 2022-12-07 ENCOUNTER — Inpatient Hospital Stay: Admission: RE | Admit: 2022-12-07 | Payer: Commercial Managed Care - HMO | Source: Ambulatory Visit

## 2022-12-08 ENCOUNTER — Telehealth: Payer: Self-pay

## 2022-12-08 NOTE — Telephone Encounter (Signed)
Pt called into [facility] stating she had to cancel her MRI appointment yesterday due to not having her 13 hr prep. A 13 hr prep had been sent for this patient on 11/24/22 but the pharmacy stated they did not receive it. Therefor, this patient was rescheduled for a MRI on 12/13/22 @ 1030 and I called the pharmacy to ensure they received the 13 hr prep prescription. I notified the patient of this and she verbalized understanding of taking the medication. Benadryl was also called in as a separate prescription at the patients request.  Prescription: 12/12/22 @ 9:30 PM- 50mg  Prednisone 12/13/22 @ 3:30 AM- 50mg  Prednisone 12/13/22 @ 9:30 AM - 50mg  Prednisone and 50mg  Benadryl

## 2022-12-08 NOTE — BH Specialist Note (Signed)
Integrated Behavioral Health via Telemedicine Visit  12/15/2022 Nancy Garrett [id number]  Number of Integrated Behavioral Health Clinician visits: Additional Visit  Session Start time: [phone]   Session End time: 1001  Total time in minutes: 36   Referring Provider: Gerrit Heck, CNM Patient/Family location: Home [facility] Provider [facility]  All persons participating in visit: [name] and [facility] Aydn Ferrara   Types of Service: Individual psychotherapy and Video visit  I connected with Laurell Roof and/or Dennison Mascot Patin's  daughters  via  Telephone or Video Enabled Telemedicine Application  (Video is Caregility application) and verified that I am speaking with the correct person using two identifiers. Discussed confidentiality: Yes   I discussed the limitations of telemedicine and the availability of in person appointments.  Discussed there is a possibility of technology failure and discussed alternative modes of communication if that failure occurs.  I discussed that engaging in this telemedicine visit, they consent to the provision of behavioral healthcare and the services will be billed under their insurance.  Patient and/or legal guardian expressed understanding and consented to Telemedicine visit: Yes   Presenting Concerns: Patient and/or family reports the following symptoms/concerns: Concern about passing out four times postpartum, interest in using in vitro for last pregnancy, as well as obtaining next surgery in [place] due to high [place] cost; noticing impact on daughter after her "paw paw" passed.  Duration of problem: Ongoing; Severity of problem: moderate  Patient and/or Family's Strengths/Protective Factors: Social connections, Concrete supports in place (healthy food, safe environments, etc.), Sense of purpose, and Physical Health (exercise, healthy diet, medication compliance, etc.)  Goals Addressed: Patient will:   Reduce symptoms of: anxiety and stress    Demonstrate ability to: Increase healthy adjustment to current life circumstances  Progress towards Goals: Ongoing  Interventions: Interventions utilized:  Motivational Interviewing and Supportive Reflection Standardized Assessments completed: Not Needed  Patient and/or Family Response: Patient agrees with treatment plan.   Assessment: Patient currently experiencing Adjustment disorder with anxious mood  Patient may benefit from continued therapeutic intervention  .  Plan: Follow up with behavioral health clinician on : Two weeks Behavioral recommendations:  -Continue prioritizing healthy self care and work-life balance daily -Continue advocating for self with appropriate healthcare; attending upcoming scheduled medical appointments -Continue enrolling daughter in summer classes for August (gymnastics, track, kickboxing for healthy outlet to help cope with grief); consider additional grief resource on After Visit Summary to share with family as needed Referral(s): Integrated [employer] (In Clinic)  I discussed the assessment and treatment plan with the patient and/or parent/guardian. They were provided an opportunity to ask questions and all were answered. They agreed with the plan and demonstrated an understanding of the instructions.   They were advised to call back or seek an in-person evaluation if the symptoms worsen or if the condition fails to improve as anticipated.  Valetta Close Lopez Dentinger, LCSW

## 2022-12-09 ENCOUNTER — Encounter: Payer: Self-pay | Admitting: Cardiology

## 2022-12-09 ENCOUNTER — Ambulatory Visit: Payer: Commercial Managed Care - HMO | Attending: Cardiology | Admitting: Cardiology

## 2022-12-09 VITALS — BP 114/78 | HR 83 | Ht 66.0 in | Wt 250.2 lb

## 2022-12-09 DIAGNOSIS — R55 Syncope and collapse: Secondary | ICD-10-CM | POA: Diagnosis not present

## 2022-12-09 NOTE — Patient Instructions (Signed)
Medication Instructions:  Your physician recommends that you continue on your current medications as directed. Please refer to the Current Medication list given to you today.  *If you need a refill on your cardiac medications before your next appointment, please call your pharmacy*   Lab Work: None   Testing/Procedures: None   Follow-Up: At [facility], you and your health needs are our priority.  As part of our continuing mission to provide you with exceptional heart care, we have created designated Provider Care Teams.  These Care Teams include your primary Cardiologist (physician) and Advanced Practice Providers (APPs -  Physician Assistants and Nurse Practitioners) who all work together to provide you with the care you need, when you need it.  We recommend signing up for the patient portal called "MyChart".  Sign up information is provided on this After Visit Summary.  MyChart is used to connect with patients for Virtual Visits (Telemedicine).  Patients are able to view lab/test results, encounter notes, upcoming appointments, etc.  Non-urgent messages can be sent to your provider as well.   To learn more about what you can do with MyChart, go to [email or web address].    Your next appointment:   4 month(s)  Provider:   Thomasene Ripple, DO     Other instructions: Please get an abdominal binder, remove it before going to bed.    KardiaMobile [email or web address]        FDA-cleared, clinical grade mobile EKG monitor: Lourena Simmonds is the most clinically-validated mobile EKG used by the world's leading cardiac care medical professionals With Basic service, know instantly if your heart rhythm is normal or if atrial fibrillation is detected, and email the last single EKG recording to yourself or your doctor Premium service, available for purchase through the Kardia app for $9.99 per month or $99 per year, includes unlimited history and storage of your  EKG recordings, a monthly EKG summary report to share with your doctor, along with the ability to track your blood pressure, activity and weight Includes one KardiaMobile phone clip FREE SHIPPING: Standard delivery 1-3 business days. Orders placed by 11:00am PST will ship that afternoon. Otherwise, will ship next business day. All orders ship via [employer] from [place], [place]    [employer] - sending an EKG Download app and set up profile. Run EKG - by placing 1-2 fingers on the silver plates After EKG is complete - Download PDF  - Skip password (if you apply a password the provider will need it to view the EKG) Click share button (square with upward arrow) in bottom left corner To send: choose MyChart (first time log into MyChart)  Pop up window about sending ECG Click continue Choose type of message Choose provider Type subject and message Click send (EKG should be attached)  - To send additional EKGs in one message click the paperclip image and bottom of page to attach.

## 2022-12-09 NOTE — Progress Notes (Signed)
Cardio-Obstetrics Clinic  New Evaluation  Date:  12/11/2022   ID:  Nancy Garrett, DOB [birth year], MRN [id number]  PCP:  Caesar Bookman, NP   [facility] Providers Cardiologist:  Thomasene Ripple, DO  Electrophysiologist:  None       Referring MD: Caesar Bookman, NP   Chief Complaint: " I am having palpitations"  History of Present Illness:    Nancy Garrett is a 32 y.o. female [G2P2002] who is being seen today for the evaluation of shortness of breath and palpitations at the request of Ngetich, Dinah C, NP.   Medical history includes history of pre-eclampsia in her first pregnancy which was 6 years ago, gestational diabetes and headaches.  Here today for follow-up visit.  Since her last visit she has experienced an episode of syncope- she now follows with neurology - she has a MRI of brain coming up soon.   Prior CV Studies Reviewed: The following studies were reviewed today:  Zio monitor 04/15/2022 Patch Wear Time:  13 days and 8 hours (2023-10-06T16:15:18-0400 to 2023-10-20T00:16:59-0400)   Patient had a min HR of 59 bpm, max HR of 168 bpm, and avg HR of 88 bpm. Predominant underlying rhythm was Sinus Rhythm. Isolated SVEs were rare (<1.0%), and no SVE Couplets or SVE Triplets were present. Isolated VEs were rare (<1.0%), VE Couplets were  rare (<1.0%), and no VE Triplets were present.    Symptoms associated with sinus tachycardia.   Conclusion: Normal/Unremarkable study.  TTE 2023 IMPRESSIONS   1. Left ventricular ejection fraction, by estimation, is 60 to 65%. The left ventricle has normal function. The left ventricle has no regional wall motion abnormalities. Left ventricular diastolic parameters were normal.   2. Right ventricular systolic function is normal. The right ventricular size is normal. There is normal pulmonary artery systolic pressure.   3. Left atrial size was mildly dilated.   4. The mitral valve is abnormal. Trivial mitral valve  regurgitation. No evidence of mitral stenosis.   5. The aortic valve is tricuspid. Aortic valve regurgitation is not visualized. No aortic stenosis is present.   6. The inferior vena cava is normal in size with greater than 50% respiratory variability, suggesting right atrial pressure of 3 mmHg.   FINDINGS   Left Ventricle: Left ventricular ejection fraction, by estimation, is 60 to 65%. The left ventricle has normal function. The left ventricle has no regional wall motion abnormalities. The left ventricular internal cavity size was normal in size. There is  no left ventricular hypertrophy. Left ventricular diastolic parameters were normal.   Right Ventricle: The right ventricular size is normal. No increase in right ventricular wall thickness. Right ventricular systolic function is normal. There is normal pulmonary artery systolic pressure. The tricuspid regurgitant velocity is 2.12 m/s, and  with an assumed right atrial pressure of 3 mmHg, the estimated right ventricular systolic pressure is 21.0 mmHg.   Left Atrium: Left atrial size was mildly dilated.   Right Atrium: Right atrial size was normal in size.   Pericardium: There is no evidence of pericardial effusion.   Mitral Valve: The mitral valve is abnormal. There is mild thickening of the mitral valve leaflet(s). Trivial mitral valve regurgitation. No evidence of mitral valve stenosis.   Tricuspid Valve: The tricuspid valve is normal in structure. Tricuspid valve regurgitation is mild . No evidence of tricuspid stenosis.   Aortic Valve: The aortic valve is tricuspid. Aortic valve regurgitation is not visualized. No aortic stenosis is present.  Pulmonic Valve: The pulmonic valve was normal in structure. Pulmonic valve regurgitation is not visualized. No evidence of pulmonic stenosis.   Aorta: The aortic root is normal in size and structure.   Venous: The inferior vena cava is normal in size with greater than 50% respiratory  variability, suggesting right atrial pressure of 3 mmHg.   IAS/Shunts: No atrial level shunt detected by color flow Doppler.   Past Medical History:  Diagnosis Date   Chronic abdominal pain    Head ache     Past Surgical History:  Procedure Laterality Date   GASTRECTOMY  2019   Dr. Darcella Gasman.   LAPAROSCOPIC GASTRIC SLEEVE RESECTION     TONSILLECTOMY        OB History     Gravida  2   Para  2   Term  2   Preterm      AB      Living  2      SAB      IAB      Ectopic      Multiple  0   Live Births  2               Current Medications: Current Meds  Medication Sig   Prenatal Vit-Fe Fumarate-FA (MULTIVITAMIN-PRENATAL) 27-0.8 MG TABS tablet Take 1 tablet by mouth daily at 12 noon.   terbinafine (LAMISIL AT) 1 % cream Apply 1 Application topically 2 (two) times daily.     Allergies:   Gadolinium, Iodinated contrast media, and Ivp dye [iodinated contrast media]   Social History   Socioeconomic History   Marital status: Married    Spouse name: Not on file   Number of children: Not on file   Years of education: Not on file   Highest education level: Not on file  Occupational History   Not on file  Tobacco Use   Smoking status: Never   Smokeless tobacco: Never  Vaping Use   Vaping Use: Never used  Substance and Sexual Activity   Alcohol use: Not Currently    Alcohol/week: 2.0 standard drinks of alcohol    Types: 2 Glasses of wine per week   Drug use: Never   Sexual activity: Yes  Other Topics Concern   Not on file  Social History Narrative   ** Merged History Encounter **       Tobacco use, amount per day now: Past tobacco use, amount per day: How many years did you use tobacco: Alcohol use (drinks per week): Diet: Do you drink/eat things with caffeine: No Marital status:   Married                               What year    were you married? 2022 Do you live in a house, apartment, assisted living, condo, trailer, etc.? House Is  it one or more stories? One How many persons live in your home? 3  Do you have pets in your home?( please list) No Highest Level of education c   ompleted? College Current or past profession: Associate Professor, Scientist, research (physical sciences). Do you exercise? No                                 Type and how often? Do you have a living will? Yes Do you have a DNR form?   No  If not, do you    want to discuss one? Do you have signed POA/HPOA forms?    No                    If so, please bring to you appointment  Do you have any difficulty bathing or dressing yourself? Yes Do you have any difficulty preparing food or eating? No Do you hav   e any difficulty managing your medications? No Do you have any difficulty managing your finances? No Do you have any difficulty affording your medications? No   Social Determinants of Health   Financial Resource Strain: Not on file  Food Insecurity: Food Insecurity Present (09/18/2022)   Hunger Vital Sign    Worried About Running Out of Food in the Last Year: Never true    Ran Out of Food in the Last Year: Sometimes true  Transportation Needs: No Transportation Needs (09/18/2022)   PRAPARE - Administrator, Civil Service (Medical): No    Lack of Transportation (Non-Medical): No  Physical Activity: Not on file  Stress: Not on file  Social Connections: Not on file      Family History  Problem Relation Age of Onset   High blood pressure Mother    Diabetes Father    Diabetes Sister    Stroke Sister    High blood pressure Sister       ROS:   Please see the history of present illness.    Reports palpiations and shortness of breath All other systems reviewed and are negative.   Labs/EKG Reviewed:    EKG:   EKG is was ordered today.  The ekg ordered today demonstrates NSR HR   Recent Labs: 03/23/2022: TSH 1.010 09/15/2022: ALT 12; BUN 10; Creatinine, Ser 0.51; Potassium 3.9; Sodium 134 10/26/2022: Hemoglobin 11.3; Platelets  201   Recent Lipid Panel Lab Results  Component Value Date/Time   CHOL 255 (H) 09/22/2021 11:10 AM   TRIG 49 09/22/2021 11:10 AM   HDL 70 09/22/2021 11:10 AM   CHOLHDL 3.6 09/22/2021 11:10 AM   LDLCALC 170 (H) 09/22/2021 11:10 AM    Physical Exam:    VS:  BP 114/78 (BP Location: Left Arm, Patient Position: Sitting, Cuff Size: Normal)   Pulse 83   Ht 5\' 6"  (1.676 m)   Wt 250 lb 3.2 oz (113.5 kg)   SpO2 96%   BMI 40.38 kg/m     Wt Readings from Last 3 Encounters:  12/09/22 250 lb 3.2 oz (113.5 kg)  10/26/22 247 lb 9.6 oz (112.3 kg)  10/22/22 244 lb (110.7 kg)     GEN:  Well nourished, well developed in no acute distress HEENT: Normal NECK: No JVD; No carotid bruits LYMPHATICS: No lymphadenopathy CARDIAC: RRR, no murmurs, rubs, gallops RESPIRATORY:  Clear to auscultation without rales, wheezing or rhonchi  ABDOMEN: Soft, non-tender, non-distended MUSCULOSKELETAL:  No edema; No deformity  SKIN: Warm and dry NEUROLOGIC:  Alert and oriented x 3 PSYCHIATRIC:  Normal affect    Risk Assessment/Risk Calculators:                  ASSESSMENT & PLAN:    Syncope Palpitations  She did have an syncope episode - is now following neurology. MRI brain scheduled.  Advised use of abdominal binder.  Zio - no evidence of arrhythmia, advised the patient to get [facility]   FU in 16 weeks postpartum.  Patient Instructions  Medication Instructions:  Your physician recommends  that you continue on your current medications as directed. Please refer to the Current Medication list given to you today.  *If you need a refill on your cardiac medications before your next appointment, please call your pharmacy*   Lab Work: None   Testing/Procedures: None   Follow-Up: At [facility], you and your health needs are our priority.  As part of our continuing mission to provide you with exceptional heart care, we have created designated Provider Care Teams.  These Care  Teams include your primary Cardiologist (physician) and Advanced Practice Providers (APPs -  Physician Assistants and Nurse Practitioners) who all work together to provide you with the care you need, when you need it.  We recommend signing up for the patient portal called "MyChart".  Sign up information is provided on this After Visit Summary.  MyChart is used to connect with patients for Virtual Visits (Telemedicine).  Patients are able to view lab/test results, encounter notes, upcoming appointments, etc.  Non-urgent messages can be sent to your provider as well.   To learn more about what you can do with MyChart, go to [email or web address].    Your next appointment:   4 month(s)  Provider:   Thomasene Ripple, DO     Other instructions: Please get an abdominal binder, remove it before going to bed.    KardiaMobile [email or web address]        FDA-cleared, clinical grade mobile EKG monitor: Lourena Simmonds is the most clinically-validated mobile EKG used by the world's leading cardiac care medical professionals With Basic service, know instantly if your heart rhythm is normal or if atrial fibrillation is detected, and email the last single EKG recording to yourself or your doctor Premium service, available for purchase through the K[employer]ia app for $9.99 per month or $99 per year, includes unlimited history and storage of your EKG recordings, a monthly EKG summary report to share with your doctor, along with the ability to track your blood pressure, activity and weight Includes one KardiaMobile phone clip FREE SHIPPING: Standard delivery 1-3 business days. Orders placed by 11:00am PST will ship that afternoon. Otherwise, will ship next business day. All orders ship via [employer] from [place], [place]    [employer] - sending an EKG Download app and set up profile. Run EKG - by placing 1-2 fingers on the silver plates After EKG is complete - Download PDF  - Skip  password (if you apply a password the provider will need it to view the EKG) Click share button (square with upward arrow) in bottom left corner To send: choose MyChart (first time log into MyChart)  Pop up window about sending ECG Click continue Choose type of message Choose provider Type subject and message Click send (EKG should be attached)  - To send additional EKGs in one message click the paperclip image and bottom of page to attach.      Dispo:  No follow-ups on file.   Medication Adjustments/Labs and Tests Ordered: Current medicines are reviewed at length with the patient today.  Concerns regarding medicines are outlined above.  Tests Ordered: No orders of the defined types were placed in this encounter.  Medication Changes: No orders of the defined types were placed in this encounter.

## 2022-12-13 ENCOUNTER — Ambulatory Visit
Admission: RE | Admit: 2022-12-13 | Discharge: 2022-12-13 | Disposition: A | Discharge status: Home / Self Care | Payer: Commercial Managed Care - HMO | Source: Ambulatory Visit | Attending: Psychiatry | Admitting: Psychiatry

## 2022-12-13 DIAGNOSIS — R9389 Abnormal findings on diagnostic imaging of other specified body structures: Secondary | ICD-10-CM

## 2022-12-13 MED ORDER — GADOPICLENOL 0.5 MMOL/ML IV SOLN
10.0000 mL | Freq: Once | INTRAVENOUS | Status: AC | PRN
  Administered 2022-12-13: 10 mL via INTRAVENOUS

## 2022-12-14 ENCOUNTER — Telehealth: Payer: Self-pay | Admitting: *Deleted

## 2022-12-14 NOTE — Telephone Encounter (Signed)
Called and informed Nancy Garrett with results. Nancy Garrett said she still concerned about the headaches and passing out. Nancy Garrett had her baby on 10-08-2022 and states she has passed out 5 times since then, she states she gets a headache and then passes out. She out for about 30 seconds, the longest has been 2 minutes. She is still taking magnesium, and  riboflavin 200 mg. Nancy Garrett is breast feeding currently as well. She is concerned about the "There is a 16 x 12 x 12 mm extra-axial cystic lesion" that was noted on MRI.   Please advise

## 2022-12-14 NOTE — Telephone Encounter (Signed)
Patient informed with all the below. Pt said she is seeing her PCP and cardiology.

## 2022-12-14 NOTE — Telephone Encounter (Signed)
-----   Message from Huston Foley, MD sent at 12/14/2022  3:45 PM EDT ----- MRI brain shows stable findings, compared to Nov 2023.

## 2022-12-14 NOTE — Telephone Encounter (Signed)
I would recommend for passing out spells that she go to the emergency room.  She should also make an appointment with her primary care to get her passing out spells investigated further, low blood pressure or low heart rate.    As far as the cystic lesion, it appears to be stable which is generally speaking a good sign.  Once she is done with breast-feeding, we can order a CT scan of the brain with contrast as a follow-up test to see if it shows up as a bony lesion.  So far, we have no signs that it is a growing abnormality.  In the meantime, if she has any new symptoms or sudden onset of severe headache, sudden onset of one-sided weakness or numbness or tingling or droopy face or slurring of speech, she is to call 911 or go to the emergency room immediately.

## 2022-12-15 ENCOUNTER — Ambulatory Visit (INDEPENDENT_AMBULATORY_CARE_PROVIDER_SITE_OTHER): Payer: Commercial Managed Care - HMO | Admitting: Clinical

## 2022-12-15 DIAGNOSIS — F4322 Adjustment disorder with anxiety: Secondary | ICD-10-CM | POA: Diagnosis not present

## 2022-12-15 NOTE — BH Specialist Note (Unsigned)
Integrated Behavioral Health via Telemedicine Visit  12/15/2022 Nancy Garrett [id number]  Number of Integrated Behavioral Health Clinician visits: Additional Visit  Session Start time: 0907   Session End time: 0951  Total time in minutes: 44   Referring Provider: Gerrit Heck, CNM Patient/Family location: Home*** [facility] Provider location: [facility]  All persons participating in visit: [name] and [facility] Nancy Garrett ***  Types of Service: {CHL AMB TYPE OF SERVICE:[phone]}  I connected with Laurell Roof and/or Dennison Mascot Bowring's {family members:20773} via  Telephone or Video Enabled Telemedicine Application  (Video is Caregility application) and verified that I am speaking with the correct person using two identifiers. Discussed confidentiality: Yes   I discussed the limitations of telemedicine and the availability of in person appointments.  Discussed there is a possibility of technology failure and discussed alternative modes of communication if that failure occurs.  I discussed that engaging in this telemedicine visit, they consent to the provision of behavioral healthcare and the services will be billed under their insurance.  Patient and/or legal guardian expressed understanding and consented to Telemedicine visit: Yes   Presenting Concerns: Patient and/or family reports the following symptoms/concerns: *** Duration of problem: ***; Severity of problem: {Mild/Moderate/Severe:20260}  Patient and/or Family's Strengths/Protective Factors: {CHL AMB BH PROTECTIVE FACTORS:[phone]}  Goals Addressed: Patient will:  Reduce symptoms of: {IBH Symptoms:21014056}   Increase knowledge and/or ability of: {IBH Patient Tools:21014057}   Demonstrate ability to: {IBH Goals:21014053}  Progress towards Goals: {CHL AMB BH PROGRESS TOWARDS [phone]}  Interventions: Interventions  utilized:  {IBH Interventions:21014054} Standardized Assessments completed: {IBH Screening Tools:21014051}  Patient and/or Family Response: Patient agrees with treatment plan. ***  Assessment: Patient currently experiencing ***.   Patient may benefit from continued therapeutic intervention *** .  Plan: Follow up with behavioral health clinician on : *** Behavioral recommendations:  -*** -*** Referral(s): {IBH Referrals:21014055}  I discussed the assessment and treatment plan with the patient and/or parent/guardian. They were provided an opportunity to ask questions and all were answered. They agreed with the plan and demonstrated an understanding of the instructions.   They were advised to call back or seek an in-person evaluation if the symptoms worsen or if the condition fails to improve as anticipated.  [name], LCSW     12/01/2022    9:42 AM 08/19/2022   11:15 AM 06/17/2022    9:27 AM 03/09/2022    9:36 AM 02/24/2022   10:22 AM  Depression screen PHQ 2/9  Decreased Interest 0 0 0 0 0  Down, Depressed, Hopeless 0 0 0 0 0  PHQ - 2 Score 0 0 0 0 0  Altered sleeping 0 0 1 3 3   Tired, decreased energy 0 2 0 2 3  Change in appetite 0 0 0 0 3  Feeling bad or failure about yourself  0 0 0 0 0  Trouble concentrating 0 0 0 1 1  Moving slowly or fidgety/restless 0 0 0 0 2  Suicidal thoughts 0 0 0 0 0  PHQ-9 Score 0 2 1 6 12       12/01/2022    9:40 AM 08/19/2022   11:15 AM 06/17/2022    9:27 AM 06/16/2022    9:56 AM  GAD 7 : Generalized Anxiety Score  Nervous, Anxious, on Edge 1 0 1 1  Control/stop worrying 1 0 0 2  Worry too much - different things 1 0 0 0  Trouble relaxing  0 0 1 1  Restless 0 0 0 0  Easily annoyed or irritable 1 1 1 1   Afraid - awful might happen 1 0 0 1  Total GAD 7 Score 5 1 3  6

## 2022-12-15 NOTE — Patient Instructions (Signed)
[facility] [address] [place], [place] [address] [phone] (main office) [phone] (Deania Siguenza's office)  [facility] (Individual and group grief support)   [facility]  [phone]     

## 2022-12-29 ENCOUNTER — Ambulatory Visit: Payer: Commercial Managed Care - HMO | Admitting: Clinical

## 2022-12-29 DIAGNOSIS — F4322 Adjustment disorder with anxiety: Secondary | ICD-10-CM

## 2022-12-29 NOTE — BH Specialist Note (Signed)
Integrated Behavioral Health via Telemedicine Visit  01/11/2023 [name] [id number]  Number of Integrated Behavioral Health Clinician visits: Additional Visit  Session Start time: 1024   Session End time: 1054  Total time in minutes: 30   Referring Provider: Gerrit Heck, CNM Patient/Family location: Home [facility] Provider location: [facility]  All persons participating in visit: [name] and [facility] [name]   Types of Service: Individual psychotherapy and Video visit  I connected with Laurell Roof and/or Dennison Mascot Cina's  n/a  via  Telephone or Video Enabled Telemedicine Application  (Video is Caregility application) and verified that I am speaking with the correct person using two identifiers. Discussed confidentiality: Yes   I discussed the limitations of telemedicine and the availability of in person appointments.  Discussed there is a possibility of technology failure and discussed alternative modes of communication if that failure occurs.  I discussed that engaging in this telemedicine visit, they consent to the provision of behavioral healthcare and the services will be billed under their insurance.  Patient and/or legal guardian expressed understanding and consented to Telemedicine visit: Yes   Presenting Concerns: Patient and/or family reports the following symptoms/concerns: Panic incident in the past week, along with new life stress (in charge of father's will with negative reaction from his other children). Duration of problem: Ongoing with recent increase; Severity of problem: moderate  Patient and/or Family's Strengths/Protective Factors: Social connections, Concrete supports in place (healthy food, safe environments, etc.), Sense of purpose, and Physical Health (exercise, healthy diet, medication compliance, etc.)  Goals Addressed: Patient will:  Reduce symptoms of:  anxiety and stress    Demonstrate ability to: Increase motivation to adhere to plan of care  Progress towards Goals: Ongoing  Interventions: Interventions utilized:  Motivational Interviewing Standardized Assessments completed: Not Needed  Patient and/or Family Response: Patient agrees with treatment plan.   Assessment: Patient currently experiencing Adjustment disorder with anxiety.   Patient may benefit from continued therapeutic intervention  .  Plan: Follow up with behavioral health clinician on : Call Kingdavid Leinbach at [phone], as needed. Behavioral recommendations:  -Continue healthy self-coping strategies and healthy self-care daily to manage emotions in the midst of life changes/extended family conflict  Referral(s): Integrated [employer] (In Clinic)  I discussed the assessment and treatment plan with the patient and/or parent/guardian. They were provided an opportunity to ask questions and all were answered. They agreed with the plan and demonstrated an understanding of the instructions.   They were advised to call back or seek an in-person evaluation if the symptoms worsen or if the condition fails to improve as anticipated.  [name], LCSW     12/01/2022    9:42 AM 08/19/2022   11:15 AM 06/17/2022    9:27 AM 03/09/2022    9:36 AM 02/24/2022   10:22 AM  Depression screen PHQ 2/9  Decreased Interest 0 0 0 0 0  Down, Depressed, Hopeless 0 0 0 0 0  PHQ - 2 Score 0 0 0 0 0  Altered sleeping 0 0 1 3 3   Tired, decreased energy 0 2 0 2 3  Change in appetite 0 0 0 0 3  Feeling bad or failure about yourself  0 0 0 0 0  Trouble concentrating 0 0 0 1 1  Moving slowly or fidgety/restless 0 0 0 0 2  Suicidal thoughts 0 0 0 0 0  PHQ-9 Score 0 2 1 6  12      12/01/2022    9:40 AM 08/19/2022   11:15 AM 06/17/2022    9:27 AM 06/16/2022    9:56 AM  GAD 7 : Generalized Anxiety Score  Nervous, Anxious, on Edge 1 0 1 1  Control/stop worrying 1 0 0 2  Worry too  much - different things 1 0 0 0  Trouble relaxing 0 0 1 1  Restless 0 0 0 0  Easily annoyed or irritable 1 1 1 1   Afraid - awful might happen 1 0 0 1  Total GAD 7 Score 5 1 3  6

## 2023-01-11 ENCOUNTER — Ambulatory Visit (INDEPENDENT_AMBULATORY_CARE_PROVIDER_SITE_OTHER): Payer: Commercial Managed Care - HMO | Admitting: Clinical

## 2023-01-11 DIAGNOSIS — F4322 Adjustment disorder with anxiety: Secondary | ICD-10-CM | POA: Diagnosis not present

## 2023-01-18 ENCOUNTER — Encounter: Payer: Self-pay | Admitting: Family Medicine

## 2023-01-18 ENCOUNTER — Ambulatory Visit (INDEPENDENT_AMBULATORY_CARE_PROVIDER_SITE_OTHER): Payer: Commercial Managed Care - HMO | Admitting: Family Medicine

## 2023-01-18 VITALS — BP 118/77 | HR 85 | Wt 253.0 lb

## 2023-01-18 DIAGNOSIS — N920 Excessive and frequent menstruation with regular cycle: Secondary | ICD-10-CM | POA: Diagnosis not present

## 2023-01-18 DIAGNOSIS — Z3009 Encounter for other general counseling and advice on contraception: Secondary | ICD-10-CM

## 2023-01-18 DIAGNOSIS — Z302 Encounter for sterilization: Secondary | ICD-10-CM | POA: Insufficient documentation

## 2023-01-18 MED ORDER — NORETHINDRONE ACETATE 5 MG PO TABS
5.0000 mg | ORAL_TABLET | Freq: Every day | ORAL | 2 refills | Status: DC
Start: 2023-01-18 — End: 2023-10-13

## 2023-01-18 NOTE — Assessment & Plan Note (Signed)
Patient counseled, r.e. Risks benefits of BTL, including permanency of procedure, discussed benefits of salpingectomy including almost no failure and prevention of ovarian cancer.  Patient verbalized understanding and desires to proceed. Risks include but are not limited to bleeding, infection, injury to surrounding structures, including bowel, bladder and ureters, blood clots, and death.  Likelihood of success is high.

## 2023-01-18 NOTE — Progress Notes (Signed)
   Subjective:    Patient ID: Nancy Garrett is a 32 y.o. female presenting with No chief complaint on file.  on 01/18/2023  HPI: Reports heavy menstrual bleeding. Since the birth of her baby. Bleeding through depends. Has h/o heavy cycles in the past. Also, has tried IUD in the past and this has had intermittent success. Desires permanent sterilization.  Review of Systems  Constitutional:  Negative for chills and fever.  Respiratory:  Negative for shortness of breath.   Cardiovascular:  Negative for chest pain.  Gastrointestinal:  Negative for abdominal pain, nausea and vomiting.  Genitourinary:  Positive for menstrual problem. Negative for dysuria.  Skin:  Negative for rash.      Objective:    BP 118/77   Pulse 85   Wt 253 lb (114.8 kg)   LMP 12/27/2022 (Exact Date)   Breastfeeding Yes   BMI 40.84 kg/m  Physical Exam Exam conducted with a chaperone present.  Constitutional:      General: She is not in acute distress.    Appearance: She is well-developed.  HENT:     Head: Normocephalic and atraumatic.  Eyes:     General: No scleral icterus. Cardiovascular:     Rate and Rhythm: Normal rate.  Pulmonary:     Effort: Pulmonary effort is normal.  Abdominal:     Palpations: Abdomen is soft.  Musculoskeletal:     Cervical back: Neck supple.  Skin:    General: Skin is warm and dry.  Neurological:     Mental Status: She is alert and oriented to person, place, and time.         Assessment & Plan:   Problem List Items Addressed This Visit       Unprioritized   Menorrhagia with regular cycle - Primary    Check labs and u/s. Add Aygestin to help with bleeding.  Book for HTA       Relevant Medications   norethindrone (AYGESTIN) 5 MG tablet   Other Relevant Orders   CBC   TSH   US PELVIC COMPLETE WITH TRANSVAGINAL   Encounter for sterilization    Patient counseled, r.e. Risks benefits of BTL, including permanency of procedure, discussed benefits of  salpingectomy including almost no failure and prevention of ovarian cancer.  Patient verbalized understanding and desires to proceed. Risks include but are not limited to bleeding, infection, injury to surrounding structures, including bowel, bladder and ureters, blood clots, and death.  Likelihood of success is high.         Return in about 2 months (around 03/20/2023) for postop check.  Reva Bores, MD 01/18/2023 10:50 AM

## 2023-01-18 NOTE — BH Specialist Note (Signed)
Integrated Behavioral Health via Telemedicine Visit  02/01/2023 Mikki Morgans [id number]  Number of Integrated Behavioral Health Clinician visits: Additional Visit  Session Start time: 1046   Session End time: 1132  Total time in minutes: 46   Referring Provider: Gerrit Heck, CNM Patient/Family location: Home [facility] Provider location: [facility]  All persons participating in visit: [name] and [facility] Vee Bahe   Types of Service: Individual psychotherapy and Video visit  I connected with Laurell Roof and/or Dennison Mascot Hinderliter's  n/a  via  Telephone or Video Enabled Telemedicine Application  (Video is Caregility application) and verified that I am speaking with the correct person using two identifiers. Discussed confidentiality: Yes   I discussed the limitations of telemedicine and the availability of in person appointments.  Discussed there is a possibility of technology failure and discussed alternative modes of communication if that failure occurs.  I discussed that engaging in this telemedicine visit, they consent to the provision of behavioral healthcare and the services will be billed under their insurance.  Patient and/or legal guardian expressed understanding and consented to Telemedicine visit: Yes   Presenting Concerns: Patient and/or family reports the following symptoms/concerns: Ongoing extended family conflict and ongoing health issues; processing options for managing both.  Duration of problem: Ongoing; Severity of problem: moderate  Patient and/or Family's Strengths/Protective Factors: Social connections, Concrete supports in place (healthy food, safe environments, etc.), Sense of purpose, and Physical Health (exercise, healthy diet, medication compliance, etc.)  Goals Addressed: Patient will:  Reduce symptoms of: anxiety and stress   Increase knowledge and/or ability  of: stress reduction   Demonstrate ability to: Increase healthy adjustment to current life circumstances  Progress towards Goals: Ongoing  Interventions: Interventions utilized:  Supportive Reflection Standardized Assessments completed: Not Needed  Patient and/or Family Response: Patient agrees with treatment plan.   Assessment: Patient currently experiencing Adjustment disorder with anxiety  Patient may benefit from continued therapeutic intervention  .  Plan: Follow up with behavioral health clinician on : Three weeks Behavioral recommendations:  -Continue daily healthy self-coping strategies to manage emotions (including maintaining healthy boundaries)  Referral(s): Integrated [employer] (In Clinic)  I discussed the assessment and treatment plan with the patient and/or parent/guardian. They were provided an opportunity to ask questions and all were answered. They agreed with the plan and demonstrated an understanding of the instructions.   They were advised to call back or seek an in-person evaluation if the symptoms worsen or if the condition fails to improve as anticipated.  [name], LCSW     12/01/2022    9:42 AM 08/19/2022   11:15 AM 06/17/2022    9:27 AM 03/09/2022    9:36 AM 02/24/2022   10:22 AM  Depression screen PHQ 2/9  Decreased Interest 0 0 0 0 0  Down, Depressed, Hopeless 0 0 0 0 0  PHQ - 2 Score 0 0 0 0 0  Altered sleeping 0 0 1 3 3   Tired, decreased energy 0 2 0 2 3  Change in appetite 0 0 0 0 3  Feeling bad or failure about yourself  0 0 0 0 0  Trouble concentrating 0 0 0 1 1  Moving slowly or fidgety/restless 0 0 0 0 2  Suicidal thoughts 0 0 0 0 0  PHQ-9 Score 0 2 1 6 12       12/01/2022    9:40 AM 08/19/2022   11:15 AM  06/17/2022    9:27 AM 06/16/2022    9:56 AM  GAD 7 : Generalized Anxiety Score  Nervous, Anxious, on Edge 1 0 1 1  Control/stop worrying 1 0 0 2  Worry too much - different things 1 0 0 0  Trouble relaxing 0 0  1 1  Restless 0 0 0 0  Easily annoyed or irritable 1 1 1 1   Afraid - awful might happen 1 0 0 1  Total GAD 7 Score 5 1 3  6

## 2023-01-18 NOTE — Assessment & Plan Note (Addendum)
Check labs and u/s. Add Aygestin to help with bleeding.  Book for HTA

## 2023-01-25 ENCOUNTER — Ambulatory Visit (HOSPITAL_BASED_OUTPATIENT_CLINIC_OR_DEPARTMENT_OTHER)
Admission: RE | Admit: 2023-01-25 | Discharge: 2023-01-25 | Disposition: A | Discharge status: Home / Self Care | Payer: Commercial Managed Care - HMO | Source: Ambulatory Visit | Attending: Family Medicine | Admitting: Family Medicine

## 2023-01-25 DIAGNOSIS — N920 Excessive and frequent menstruation with regular cycle: Secondary | ICD-10-CM | POA: Diagnosis present

## 2023-02-01 ENCOUNTER — Ambulatory Visit (INDEPENDENT_AMBULATORY_CARE_PROVIDER_SITE_OTHER): Payer: Commercial Managed Care - HMO | Admitting: Clinical

## 2023-02-01 DIAGNOSIS — F4322 Adjustment disorder with anxiety: Secondary | ICD-10-CM
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
## 2023-02-22 ENCOUNTER — Ambulatory Visit (INDEPENDENT_AMBULATORY_CARE_PROVIDER_SITE_OTHER): Payer: Commercial Managed Care - HMO | Admitting: Clinical

## 2023-02-22 DIAGNOSIS — F4322 Adjustment disorder with anxiety: Secondary | ICD-10-CM

## 2023-02-22 DIAGNOSIS — F4321 Adjustment disorder with depressed mood: Secondary | ICD-10-CM

## 2023-03-08 NOTE — BH Specialist Note (Unsigned)
Integrated Behavioral Health via Telemedicine Visit  03/08/2023 Nancy Garrett [id number]  Number of Integrated Behavioral Health Clinician visits: Additional Visit  Session Start time: 1045   Session End time: 1138  Total time in minutes: 53   Referring Provider: Gerrit Heck, CNM Patient/Family location: Home*** [facility] Provider [facility]: [facility] for [facility]  All persons participating in visit: [name] and [facility] Nancy Garrett ***  Types of Service: {CHL AMB TYPE OF [phone]}  I connected with Nancy Garrett and/or Nancy Garrett's {family members:[id number]} via  Telephone or Video Enabled Telemedicine Application  (Video is Caregility application) and verified that I am speaking with the correct person using two identifiers. Discussed confidentiality: Yes   I discussed the limitations of telemedicine and the availability of in person appointments.  Discussed there is a possibility of technology failure and discussed alternative modes of communication if that failure occurs.  I discussed that engaging in this telemedicine visit, they consent to the provision of behavioral healthcare and the services will be billed under their insurance.  Patient and/or legal guardian expressed understanding and consented to Telemedicine visit: Yes   Presenting Concerns: Patient and/or family reports the following symptoms/concerns: *** Duration of problem: ***; Severity of problem: {Mild/Moderate/Severe:20260}  Patient and/or Family's Strengths/Protective Factors: {CHL AMB BH PROTECTIVE FACTORS:[phone]}  Goals Addressed: Patient will:  Reduce symptoms of: {IBH Symptoms:21014056}   Increase knowledge and/or ability of: {IBH Patient Tools:21014057}   Demonstrate ability to: {IBH Goals:21014053}  Progress towards Goals: {CHL AMB BH PROGRESS TOWARDS GOALS:[phone]}  Interventions: Interventions  utilized:  {IBH Interventions:21014054} Standardized Assessments completed: {IBH Screening Tools:21014051}  Patient and/or Family Response: Patient agrees with treatment plan. ***  Assessment: Patient currently experiencing ***.   Patient may benefit from 03/22/2023 .  Plan: Follow up with behavioral health clinician on : *** Behavioral recommendations:  -*** -*** Referral(s): {IBH Referrals:21014055}  I discussed the assessment and treatment plan with the patient and/or parent/guardian. They were provided an opportunity to ask questions and all were answered. They agreed with the plan and demonstrated an understanding of the instructions.   They were advised to call back or seek an in-person evaluation if the symptoms worsen or if the condition fails to improve as anticipated.  [name], LCSW     12/01/2022    9:42 AM 08/19/2022   11:15 AM 06/17/2022    9:27 AM 03/09/2022    9:36 AM 02/24/2022   10:22 AM  Depression screen PHQ 2/9  Decreased Interest 0 0 0 0 0  Down, Depressed, Hopeless 0 0 0 0 0  PHQ - 2 Score 0 0 0 0 0  Altered sleeping 0 0 1 3 3   Tired, decreased energy 0 2 0 2 3  Change in appetite 0 0 0 0 3  Feeling bad or failure about yourself  0 0 0 0 0  Trouble concentrating 0 0 0 1 1  Moving slowly or fidgety/restless 0 0 0 0 2  Suicidal thoughts 0 0 0 0 0  PHQ-9 Score 0 2 1 6 12       12/01/2022    9:40 AM 08/19/2022   11:15 AM 06/17/2022    9:27 AM 06/16/2022    9:56 AM  GAD 7 : Generalized Anxiety Score  Nervous, Anxious, on Edge 1 0 1 1  Control/stop worrying 1 0 0 2  Worry too much - different things 1 0 0 0  Trouble relaxing 0 0 1  1  Restless 0 0 0 0  Easily annoyed or irritable 1 1 1 1   Afraid - awful might happen 1 0 0 1  Total GAD 7 Score 5 1 3  6

## 2023-03-15 ENCOUNTER — Telehealth: Payer: Self-pay

## 2023-03-15 NOTE — Telephone Encounter (Signed)
Called patient to schedule surgery with Dr. Shawnie Pons on 04/06/23. Left a voicemail asking for a return call at [phone].

## 2023-03-16 HISTORY — PX: TUBAL LIGATION: SHX77

## 2023-03-22 ENCOUNTER — Ambulatory Visit (INDEPENDENT_AMBULATORY_CARE_PROVIDER_SITE_OTHER): Payer: 59 | Admitting: Clinical

## 2023-03-22 DIAGNOSIS — F4322 Adjustment disorder with anxiety: Secondary | ICD-10-CM | POA: Diagnosis not present

## 2023-03-24 ENCOUNTER — Encounter: Payer: Self-pay | Admitting: Cardiology

## 2023-03-24 ENCOUNTER — Ambulatory Visit: Payer: Medicaid Other | Attending: Cardiology | Admitting: Cardiology

## 2023-03-24 VITALS — BP 100/68 | HR 75 | Ht 66.0 in | Wt 259.0 lb

## 2023-03-24 DIAGNOSIS — R55 Syncope and collapse: Secondary | ICD-10-CM

## 2023-03-24 DIAGNOSIS — R002 Palpitations: Secondary | ICD-10-CM | POA: Diagnosis not present

## 2023-03-24 NOTE — Patient Instructions (Signed)
Medication Instructions:  Your physician recommends that you continue on your current medications as directed. Please refer to the Current Medication list given to you today. Your physician recommends that you continue on your current medications as directed. Please refer to the Current Medication list given to you today.  *If you need a refill on your cardiac medications before your next appointment, please call your pharmacy*   Lab Work: None    Testing/Procedures: None   Follow-Up: At [facility], you and your health needs are our priority.  As part of our continuing mission to provide you with exceptional heart care, we have created designated Provider Care Teams.  These Care Teams include your primary Cardiologist (physician) and Advanced Practice Providers (APPs -  Physician Assistants and Nurse Practitioners) who all work together to provide you with the care you need, when you need it.    Your next appointment:   6 month(s)  Provider:   Thomasene Ripple, DO

## 2023-03-25 NOTE — Progress Notes (Signed)
Cardio-Obstetrics Clinic  New Evaluation  Date:  03/25/2023   ID:  Nancy Garrett, DOB [birth year], MRN [id number]  PCP:  Caesar Bookman, NP   [facility] Providers Cardiologist:  Thomasene Ripple, DO  Electrophysiologist:  None       Referring MD: Caesar Bookman, NP   Chief Complaint: " I am ok"  History of Present Illness:    Nancy Garrett is a 32 y.o. female [G2P2002] who is being seen today for the evaluation of shortness of breath and palpitations at the request of Ngetich, Dinah C, NP.   Medical history includes history of pre-eclampsia in her first pregnancy which was 6 years ago, gestational diabetes and headaches.  Here today for follow-up visit.  She has delivered since her last visit. She reports that she still is experiencing recurrent syncope episodes.  The most recent episode was different from previous ones, with the patient experiencing palpitations, a sensation of heat and then passing out. During this episode, the patient could hear but was unable to respond. The patient also hit her head on a nightstand during the episode.      Prior CV Studies Reviewed: The following studies were reviewed today:  Zio monitor 04/15/2022 Patch Wear Time:  13 days and 8 hours (2023-10-06T16:15:18-0400 to 2023-10-20T00:16:59-0400)   Patient had a min HR of 59 bpm, max HR of 168 bpm, and avg HR of 88 bpm. Predominant underlying rhythm was Sinus Rhythm. Isolated SVEs were rare (<1.0%), and no SVE Couplets or SVE Triplets were present. Isolated VEs were rare (<1.0%), VE Couplets were  rare (<1.0%), and no VE Triplets were present.    Symptoms associated with sinus tachycardia.   Conclusion: Normal/Unremarkable study.  TTE 2023 IMPRESSIONS   1. Left ventricular ejection fraction, by estimation, is 60 to 65%. The left ventricle has normal function. The left ventricle has no regional wall motion abnormalities. Left ventricular diastolic parameters were  normal.   2. Right ventricular systolic function is normal. The right ventricular size is normal. There is normal pulmonary artery systolic pressure.   3. Left atrial size was mildly dilated.   4. The mitral valve is abnormal. Trivial mitral valve regurgitation. No evidence of mitral stenosis.   5. The aortic valve is tricuspid. Aortic valve regurgitation is not visualized. No aortic stenosis is present.   6. The inferior vena cava is normal in size with greater than 50% respiratory variability, suggesting right atrial pressure of 3 mmHg.   FINDINGS   Left Ventricle: Left ventricular ejection fraction, by estimation, is 60 to 65%. The left ventricle has normal function. The left ventricle has no regional wall motion abnormalities. The left ventricular internal cavity size was normal in size. There is  no left ventricular hypertrophy. Left ventricular diastolic parameters were normal.   Right Ventricle: The right ventricular size is normal. No increase in right ventricular wall thickness. Right ventricular systolic function is normal. There is normal pulmonary artery systolic pressure. The tricuspid regurgitant velocity is 2.12 m/s, and  with an assumed right atrial pressure of 3 mmHg, the estimated right ventricular systolic pressure is 21.0 mmHg.   Left Atrium: Left atrial size was mildly dilated.   Right Atrium: Right atrial size was normal in size.   Pericardium: There is no evidence of pericardial effusion.   Mitral Valve: The mitral valve is abnormal. There is mild thickening of the mitral valve leaflet(s). Trivial mitral valve regurgitation. No evidence of mitral valve stenosis.   Tricuspid Valve:  The tricuspid valve is normal in structure. Tricuspid valve regurgitation is mild . No evidence of tricuspid stenosis.   Aortic Valve: The aortic valve is tricuspid. Aortic valve regurgitation is not visualized. No aortic stenosis is present.   Pulmonic Valve: The pulmonic valve was normal in  structure. Pulmonic valve regurgitation is not visualized. No evidence of pulmonic stenosis.   Aorta: The aortic root is normal in size and structure.   Venous: The inferior vena cava is normal in size with greater than 50% respiratory variability, suggesting right atrial pressure of 3 mmHg.   IAS/Shunts: No atrial level shunt detected by color flow Doppler.   Past Medical History:  Diagnosis Date   Chronic abdominal pain    Head ache     Past Surgical History:  Procedure Laterality Date   GASTRECTOMY  2019   Dr. Darcella Gasman.   LAPAROSCOPIC GASTRIC SLEEVE RESECTION     TONSILLECTOMY        OB History     Gravida  2   Para  2   Term  2   Preterm      AB      Living  2      SAB      IAB      Ectopic      Multiple  0   Live Births  2               Current Medications: Current Meds  Medication Sig   norethindrone (AYGESTIN) 5 MG tablet Take 1 tablet (5 mg total) by mouth daily.   Prenatal Vit-Fe Fumarate-FA (MULTIVITAMIN-PRENATAL) 27-0.8 MG TABS tablet Take 1 tablet by mouth daily.     Allergies:   Gadolinium, Iodinated contrast media, and Ivp dye [iodinated contrast media]   Social History   Socioeconomic History   Marital status: Married    Spouse name: Not on file   Number of children: Not on file   Years of education: Not on file   Highest education level: Not on file  Occupational History   Not on file  Tobacco Use   Smoking status: Never   Smokeless tobacco: Never  Vaping Use   Vaping status: Never Used  Substance and Sexual Activity   Alcohol use: Not Currently    Alcohol/week: 2.0 standard drinks of alcohol    Types: 2 Glasses of wine per week   Drug use: Never   Sexual activity: Yes  Other Topics Concern   Not on file  Social History Narrative   ** Merged History Encounter **       Tobacco use, amount per day now: Past tobacco use, amount per day: How many years did you use tobacco: Alcohol use (drinks per  week): Diet: Do you drink/eat things with caffeine: No Marital status:   Married                               What year    were you married? 2022 Do you live in a house, apartment, assisted living, condo, trailer, etc.? House Is it one or more stories? One How many persons live in your home? 3  Do you have pets in your home?( please list) No Highest Level of education c   ompleted? College Current or past profession: Associate Professor, Scientist, research (physical sciences). Do you exercise? No  Type and how often? Do you have a living will? Yes Do you have a DNR form?   No                               If not, do you    want to discuss one? Do you have signed POA/HPOA forms?    No                    If so, please bring to you appointment  Do you have any difficulty bathing or dressing yourself? Yes Do you have any difficulty preparing food or eating? No Do you hav   e any difficulty managing your medications? No Do you have any difficulty managing your finances? No Do you have any difficulty affording your medications? No   Social Determinants of Health   Financial Resource Strain: Not on file  Food Insecurity: Food Insecurity Present (09/18/2022)   Hunger Vital Sign    Worried About Running Out of Food in the Last Year: Never true    Ran Out of Food in the Last Year: Sometimes true  Transportation Needs: No Transportation Needs (09/18/2022)   PRAPARE - Administrator, Civil Service (Medical): No    Lack of Transportation (Non-Medical): No  Physical Activity: Not on file  Stress: Not on file  Social Connections: Unknown (10/13/2021)   [facility]   Social Network    Social Network: Not on file      Family History  Problem Relation Age of Onset   High blood pressure Mother    Diabetes Father    Diabetes Sister    Stroke Sister    High blood pressure Sister       ROS:   Please see the history of present illness.    Reports  palpiations and shortness of breath All other systems reviewed and are negative.   Labs/EKG Reviewed:    EKG:   EKG is was ordered today.  The ekg ordered today demonstrates NSR HR   Recent Labs: 09/15/2022: ALT 12; BUN 10; Creatinine, Ser 0.51; Potassium 3.9; Sodium 134 01/18/2023: Hemoglobin 11.3; Platelets 260; TSH 0.864   Recent Lipid Panel Lab Results  Component Value Date/Time   CHOL 255 (H) 09/22/2021 11:10 AM   TRIG 49 09/22/2021 11:10 AM   HDL 70 09/22/2021 11:10 AM   CHOLHDL 3.6 09/22/2021 11:10 AM   LDLCALC 170 (H) 09/22/2021 11:10 AM    Physical Exam:    VS:  BP 100/68 (BP Location: Left Arm, Patient Position: Sitting, Cuff Size: Large)   Pulse 75   Ht 5\' 6"  (1.676 m)   Wt 259 lb (117.5 kg)   SpO2 97%   BMI 41.80 kg/m     Wt Readings from Last 3 Encounters:  03/24/23 259 lb (117.5 kg)  01/18/23 253 lb (114.8 kg)  12/09/22 250 lb 3.2 oz (113.5 kg)     GEN:  Well nourished, well developed in no acute distress HEENT: Normal NECK: No JVD; No carotid bruits LYMPHATICS: No lymphadenopathy CARDIAC: RRR, no murmurs, rubs, gallops RESPIRATORY:  Clear to auscultation without rales, wheezing or rhonchi  ABDOMEN: Soft, non-tender, non-distended MUSCULOSKELETAL:  No edema; No deformity  SKIN: Warm and dry NEUROLOGIC:  Alert and oriented x 3 PSYCHIATRIC:  Normal affect    Risk Assessment/Risk Calculators:  ASSESSMENT & PLAN:    Syncope Recurrent episodes of syncope,  previous zio monitor normal. EKG today normal  Refer to electrophysiology for loop recorder evaluation.  Breastfeeding Patient reports decreased milk production, supplementing with formula.  No signs of postpartum depression.  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 No current plan for intervention. -Follow-up with neurology      Patient Instructions  Medication Instructions:  Your physician recommends that you continue on your current medications  as directed. Please refer to the Current Medication list given to you today. Your physician recommends that you continue on your current medications as directed. Please refer to the Current Medication list given to you today.  *If you need a refill on your cardiac medications before your next appointment, please call your pharmacy*   Lab Work: None    Testing/Procedures: None   Follow-Up: At [facility], you and your health needs are our priority.  As part of our continuing mission to provide you with exceptional heart care, we have created designated Provider Care Teams.  These Care Teams include your primary Cardiologist (physician) and Advanced Practice Providers (APPs -  Physician Assistants and Nurse Practitioners) who all work together to provide you with the care you need, when you need it.    Your next appointment:   6 month(s)  Provider:   Thomasene Ripple, DO     Dispo:  No follow-ups on file.   Medication Adjustments/Labs and Tests Ordered: Current medicines are reviewed at length with the patient today.  Concerns regarding medicines are outlined above.  Tests Ordered: Orders Placed This Encounter  Procedures   Ambulatory referral to Cardiac Electrophysiology   EKG 12-Lead   Medication Changes: No orders of the defined types were placed in this encounter.

## 2023-03-26 NOTE — Progress Notes (Signed)
Surgical Instructions   Your procedure is scheduled on Tuesday, 04/06/23. Report to [facility] Main Entrance "A" at 10:34 A.M., then check in with the Admitting office. Any questions or running late day of surgery: call [phone]  Questions prior to your surgery date: call [phone], Monday-Friday, 8am-4pm. If you experience any cold or flu symptoms such as cough, fever, chills, shortness of breath, etc. between now and your scheduled surgery, please notify us at the above number.     Remember:  Do not eat after midnight the night before your surgery  You may drink clear liquids until 9:30am the morning of your surgery.   Clear liquids allowed are: Water, Non-Citrus Juices (without pulp), Carbonated Beverages, Clear Tea, Black Coffee Only (NO MILK, CREAM OR POWDERED CREAMER of any kind), and Gatorade.    Take these medicines the morning of surgery with A SIP OF WATER  norethindrone (AYGESTIN)   One week prior to surgery, STOP taking any Aspirin (unless otherwise instructed by your surgeon) Aleve, Naproxen, Ibuprofen, Motrin, Advil, Goody's, BC's, all herbal medications, fish oil, and non-prescription vitamins.                     Do NOT Smoke (Tobacco/Vaping) for 24 hours prior to your procedure.  If you use a CPAP at night, you may bring your mask/headgear for your overnight stay.   You will be asked to remove any contacts, glasses, piercing's, hearing aid's, dentures/partials prior to surgery. Please bring cases for these items if needed.    Patients discharged the day of surgery will not be allowed to drive home, and someone needs to stay with them for 24 hours.  SURGICAL WAITING ROOM VISITATION Patients may have no more than 2 support people in the waiting area - these visitors may rotate.   Pre-op nurse will coordinate an appropriate time for 1 ADULT support person, who may not rotate, to accompany patient in pre-op.  Children under the age of 52 must have an adult with  them who is not the patient and must remain in the main waiting area with an adult.  If the patient needs to stay at the hospital during part of their recovery, the visitor guidelines for inpatient rooms apply.  Please refer to the [facility] website for the visitor guidelines for any additional information.   If you received a COVID test during your pre-op visit  it is requested that you wear a mask when out in public, stay away from anyone that may not be feeling well and notify your surgeon if you develop symptoms. If you have been in contact with anyone that has tested positive in the last 10 days please notify you surgeon.      Pre-operative CHG Bathing Instructions   You can play a key role in reducing the risk of infection after surgery. Your skin needs to be as free of germs as possible. You can reduce the number of germs on your skin by washing with CHG (chlorhexidine gluconate) soap before surgery. CHG is an antiseptic soap that kills germs and continues to kill germs even after washing.   DO NOT use if you have an allergy to chlorhexidine/CHG or antibacterial soaps. If your skin becomes reddened or irritated, stop using the CHG and notify one of our RNs at [phone].              TAKE A SHOWER THE NIGHT BEFORE SURGERY AND THE DAY OF SURGERY    Please keep  in mind the following:  DO NOT shave, including legs and underarms, 48 hours prior to surgery.   You may shave your face before/day of surgery.  Place clean sheets on your bed the night before surgery Use a clean washcloth (not used since being washed) for each shower. DO NOT sleep with pet's night before surgery.  CHG Shower Instructions:  Wash your face and private area with normal soap. If you choose to wash your hair, wash first with your normal shampoo.  After you use shampoo/soap, rinse your hair and body thoroughly to remove shampoo/soap residue.  Turn the water OFF and apply half the bottle of CHG soap to a CLEAN  washcloth.  Apply CHG soap ONLY FROM YOUR NECK DOWN TO YOUR TOES (washing for 3-5 minutes)  DO NOT use CHG soap on face, private areas, open wounds, or sores.  Pay special attention to the area where your surgery is being performed.  If you are having back surgery, having someone wash your back for you may be helpful. Wait 2 minutes after CHG soap is applied, then you may rinse off the CHG soap.  Pat dry with a clean towel  Put on clean pajamas    Additional instructions for the day of surgery: DO NOT APPLY any lotions, deodorants, cologne, or perfumes.   Do not wear jewelry or makeup Do not wear nail polish, gel polish, artificial nails, or any other type of covering on natural nails (fingers and toes) Do not bring valuables to the hospital. [facility] is not responsible for valuables/personal belongings. Put on clean/comfortable clothes.  Please brush your teeth.  Ask your nurse before applying any prescription medications to the skin.

## 2023-03-27 NOTE — H&P (Signed)
Nancy Garrett is an 31 y.o. [phone] female.   Chief Complaint: undesired fertility and abnormal bleeding HPI: Reports heavy menstrual bleeding. Since the birth of her baby. Bleeding through depends. Has h/o heavy cycles in the past. Also, has tried IUD in the past and this has had intermittent success. Desires permanent sterilization.   Past Medical History:  Diagnosis Date   Chronic abdominal pain    Head ache     Past Surgical History:  Procedure Laterality Date   GASTRECTOMY  2019   Dr. Darcella Gasman.   LAPAROSCOPIC GASTRIC SLEEVE RESECTION     TONSILLECTOMY      Family History  Problem Relation Age of Onset   High blood pressure Mother    Diabetes Father    Diabetes Sister    Stroke Sister    High blood pressure Sister    Social History:  reports that she has never smoked. She has never used smokeless tobacco. She reports that she does not currently use alcohol after a past usage of about 2.0 standard drinks of alcohol per week. She reports that she does not use drugs.  Allergies:  Allergies  Allergen Reactions   Gadolinium Hives, Itching and Nausea And Vomiting    Pt reports she has allergy to MRI contrast not CT contrast. AV, RN 11/24/22   Iodinated Contrast Media    Ivp Dye [Iodinated Contrast Media] Nausea And Vomiting    No medications prior to admission.    A comprehensive review of systems was negative.  currently breastfeeding. General appearance: alert, cooperative, and appears stated age Head: Normocephalic, without obvious abnormality, atraumatic Neck: supple, symmetrical, trachea midline Lungs:  normal effort Heart: regular rate and rhythm Abdomen: soft, non-tender; bowel sounds normal; no masses,  no organomegaly Extremities: extremities normal, atraumatic, no cyanosis or edema Skin: Skin color, texture, turgor normal. No rashes or lesions Neurologic: Grossly normal   No results found for this or any previous visit (from the past 24  hour(s)). No results found.  Assessment/Plan Active Problems:   Menorrhagia with regular cycle   Encounter for sterilization  For laparoscopic BTL with salpingectomy and HTA endometrial ablation. Patient counseled, r.e. Risks benefits of BTL, including permanency of procedure, discussed benefits of salpingectomy including almost no failure and prevention of ovarian cancer. Patient verbalized understanding and desires to proceed  Risks include but are not limited to bleeding, infection, injury to surrounding structures, including bowel, bladder and ureters, blood clots, and death.  Likelihood of success is high.    Reva Bores 03/27/2023, 11:32 PM

## 2023-03-29 ENCOUNTER — Encounter (HOSPITAL_COMMUNITY)
Admission: RE | Admit: 2023-03-29 | Discharge: 2023-03-29 | Disposition: A | Discharge status: Home / Self Care | Payer: Medicaid Other | Source: Ambulatory Visit | Attending: Family Medicine

## 2023-03-29 ENCOUNTER — Encounter (HOSPITAL_COMMUNITY): Payer: Self-pay

## 2023-03-29 ENCOUNTER — Other Ambulatory Visit: Payer: Self-pay

## 2023-03-29 VITALS — BP 106/68 | HR 70 | Temp 98.4°F | Resp 16 | Ht 66.0 in | Wt 256.6 lb

## 2023-03-29 DIAGNOSIS — Z01812 Encounter for preprocedural laboratory examination: Secondary | ICD-10-CM | POA: Diagnosis present

## 2023-03-29 DIAGNOSIS — R109 Unspecified abdominal pain: Secondary | ICD-10-CM | POA: Insufficient documentation

## 2023-03-29 DIAGNOSIS — G8929 Other chronic pain: Secondary | ICD-10-CM | POA: Insufficient documentation

## 2023-03-29 DIAGNOSIS — Z6841 Body Mass Index (BMI) 40.0 and over, adult: Secondary | ICD-10-CM | POA: Insufficient documentation

## 2023-03-29 DIAGNOSIS — R519 Headache, unspecified: Secondary | ICD-10-CM | POA: Diagnosis not present

## 2023-03-29 DIAGNOSIS — D649 Anemia, unspecified: Secondary | ICD-10-CM | POA: Insufficient documentation

## 2023-03-29 DIAGNOSIS — Z01818 Encounter for other preprocedural examination: Secondary | ICD-10-CM

## 2023-03-29 DIAGNOSIS — I2510 Atherosclerotic heart disease of native coronary artery without angina pectoris: Secondary | ICD-10-CM | POA: Insufficient documentation

## 2023-03-29 DIAGNOSIS — N920 Excessive and frequent menstruation with regular cycle: Secondary | ICD-10-CM | POA: Diagnosis not present

## 2023-03-29 DIAGNOSIS — Z9884 Bariatric surgery status: Secondary | ICD-10-CM | POA: Insufficient documentation

## 2023-03-29 HISTORY — DX: Anxiety disorder, unspecified: F41.9

## 2023-03-29 HISTORY — DX: Anemia, unspecified: D64.9

## 2023-03-29 HISTORY — DX: Syncope and collapse: R55

## 2023-03-29 LAB — BASIC METABOLIC PANEL
Anion gap: 7 (ref 5–15)
BUN: 13 mg/dL (ref 6–20)
CO2: 25 mmol/L (ref 22–32)
Calcium: 9.1 mg/dL (ref 8.9–10.3)
Chloride: 107 mmol/L (ref 98–111)
Creatinine, Ser: 0.66 mg/dL (ref 0.44–1.00)
GFR, Estimated: >60 mL/min (ref >60)
Glucose, Bld: 102 mg/dL — ABNORMAL HIGH (ref 70–99)
Potassium: 3.8 mmol/L (ref 3.5–5.1)
Sodium: 139 mmol/L (ref 135–145)

## 2023-03-29 LAB — CBC
HCT: 34.6 % — ABNORMAL LOW (ref 36.0–46.0)
Hemoglobin: 10.6 g/dL — ABNORMAL LOW (ref 12.0–15.0)
MCH: 25.5 pg — ABNORMAL LOW (ref 26.0–34.0)
MCHC: 30.6 g/dL (ref 30.0–36.0)
MCV: 83.2 fL (ref 80.0–100.0)
Platelets: 295 10*3/uL (ref 150–400)
RBC: 4.16 MIL/uL (ref 3.87–5.11)
RDW: 16.5 % — ABNORMAL HIGH (ref 11.5–15.5)
WBC: 10.3 10*3/uL (ref 4.0–10.5)
nRBC: 0 % (ref 0.0–0.2)

## 2023-03-29 NOTE — Progress Notes (Signed)
PCP - Caesar Bookman, NP Cardiologist - Thomasene Ripple, DO Neurology - Was Dr. Delena Bali, but MD left practice. Pt waiting to be set up with someone else.  PPM/ICD - Denies Device Orders - n/a Rep Notified - n/a  Chest x-ray - Denies EKG - 03/24/2023 Stress Test - Denies ECHO - 04/14/2022 Cardiac Cath - Denies  Sleep Study - Denies CPAP - n/a  No DM  Last dose of GLP1 agonist- n/a GLP1 instructions: n/a  Blood Thinner Instructions: n/a Aspirin Instructions: n/a  ERAS Protcol - Clear liquids until 0930 morning of surgery PRE-SURGERY Ensure or G2- n/a  COVID TEST- n/a   Anesthesia review: Yes. Pt has an ongoing history of syncopal episodes with all cardiac and neurologic testing negative (benign brain tumor found but deemed unrelated to syncopal episodes). Per cardiology, pt next step was to be set up with EP doctor to determine if loop recorder needed. Discussed with Antionette Poles, PA-C, pt instructed to call cardiology office and make them aware of upcoming surgery in case any additional testing/visits are needed. Pt understood instructions.  Patient denies shortness of breath, fever, cough and chest pain at PAT appointment. Pt denies any respiratory illness/infection in the last two months.   All instructions explained to the patient, with a verbal understanding of the material. Patient agrees to go over the instructions while at home for a better understanding. Patient also instructed to self quarantine after being tested for COVID-19. The opportunity to ask questions was provided.

## 2023-03-30 NOTE — Progress Notes (Signed)
Anesthesia Chart Review:  Case: [id number] Date/Time: 04/06/23 1219   Procedures:      DILATATION & CURETTAGE/HYSTEROSCOPY WITH HYDROTHERMAL ABLATION     LAPAROSCOPIC BILATERAL SALPINGECTOMY (Bilateral)   Anesthesia type: Choice   Pre-op diagnosis:      Menorrhagia     Undesired Fertility   Location: [facility] ROOM 07 / [facility]   Surgeons: Reva Bores, MD       DISCUSSION: Patient is a 32 year old female scheduled for the above procedure.   History includes never smoker, anemia, morbid obesity (s/p sleeve gastrectomy 08/24/17), chronic abdominal pain, headaches, pre-eclampsia (2017).  She has been followed by cardiologist Dr. Servando Salina since 03/20/22 at the [facility] when she was evaluated for dyspnea and palpitations. 14 day monitor did not show any significant arrhythmias. 03/17/22 echo showed LVEF 60 to 65%, no regional wall motion abnormalities, normal LV diastolic parameters, normal RV systolic function, normal PASP, mildly dilated LA, trivial MR.  She reported syncope x2 associated with palpitations prior to 05/2022 visit and was started on propranolol with improvement in her palpitations and syncope. She had recurrent episodes of syncope x5 after the birth of her baby on 09/18/22. She saw Dr. Servando Salina on 03/24/23 with referral to EP planned for consideration of a loop recorder.   She was evaluated by neurologist Dr. Delena Bali on 04/08/22 for headaches. 05/02/22 MRI Brain showed a right anterolateral middle fossa lesion felt consistent with a benign osteoma or a heavily calcified meningioma. MRV was normal. MRI with contrast was recommended for better clarification following delivery of her infant. Marland Kitchen MRV was normal. Follow-up Brain MRI on 12/13/22 with and without contrast showed stable appearance of the cystic lesion along the anterolateral right middle cranial fossa of indeterminate etiology but atypical for arachnoid or epidermoid cyst.  Per Dr. Tawni Levy Progress Notes, patient with heavy  menstrual cycles with intermittent success with IUDs and now desires permanent sterilization. H/H 10.6/34.6. she is still breastfeeding per PAT RN notations.   Since she has pending EP evaluation, will reach out to cardiology to see if Dr. Servando Salina is aware of surgery plans.    VS: BP 106/68   Pulse 70   Temp 36.9 C   Resp 16   Ht 5\' 6"  (1.676 m)   Wt 116.4 kg   LMP 03/29/2023 (Exact Date)   SpO2 100%   BMI 41.42 kg/m    PROVIDERS: Ngetich, Donalee Citrin, NP is PCP Thomasene Ripple, DO is cardiologist. She has pending EP evaluation with Elberta Fortis, Will, MD on 05/03/23.  Ocie Doyne, MD is neurologist. Next visit scheduled on 04/15/23, although patient says it will be with another provider as Dr. Delena Bali is not longer with that practice. Marland Kitchen   LABS: Labs reviewed: Acceptable for surgery. (all labs ordered are listed, but only abnormal results are displayed)  Labs Reviewed  BASIC METABOLIC PANEL - Abnormal; Notable for the following components:      Result Value   Glucose, Bld 102 (*)    All other components within normal limits  CBC - Abnormal; Notable for the following components:   Hemoglobin 10.6 (*)    HCT 34.6 (*)    MCH 25.5 (*)    RDW 16.5 (*)    All other components within normal limits    IMAGES: US transabdominal/transvaginal Pelvis 01/25/23: IMPRESSION: 1. No acute process. 2. Endometrium measures 8.1 mm. If bleeding remains unresponsive to hormonal or medical therapy, sonohysterogram should be considered for focal lesion work-up. (Ref: Radiological Reasoning: Algorithmic Workup  of Abnormal Vaginal Bleeding with Endovaginal Sonography and Sonohysterography. AJR 2008; 161:W96-04)   MRI Brain (with and without contrast) 12/13/22:  IMPRESSION: MRI scan of the brain with and without contrast showing stable appearance of the cystic lesion along the anterior lateral aspect of the right middle cranial fossa of indeterminate etiology.  Signal characteristics are atypical for  arachnoid or epidermoid cyst.  No abnormal abnormal enhancement is noted.   MRI L-spine 11/03/22: IMPRESSION: 1. Mild degenerative disc bulging with superimposed tiny central disc protrusion at L5-S1 without significant stenosis or overt neural impingement. 2. Mild multilevel facet hypertrophy throughout the lumbar spine, which could contribute to lower back pain.  MRV Head 05/02/22: IMPRESSION: This is a normal MR venogram of the intracranial veins and sinuses.    EKG: 03/24/23: NSR   CV: Long term monitor 03/20/22 - 04/03/22: - Patient had a min HR of 59 bpm, max HR of 168 bpm, and avg HR of 88 bpm. Predominant underlying rhythm was Sinus Rhythm. Isolated SVEs were rare (<1.0%), and no SVE Couplets or SVE Triplets were present. Isolated VEs were rare (<1.0%), VE Couplets were  rare (<1.0%), and no VE Triplets were present.  - Symptoms associated with sinus tachycardia. - Conclusion: Normal/Unremarkable study.   Echo 03/17/22 (during pregnancy): IMPRESSIONS   1. Left ventricular ejection fraction, by estimation, is 60 to 65%. The  left ventricle has normal function. The left ventricle has no regional  wall motion abnormalities. Left ventricular diastolic parameters were  normal.   2. Right ventricular systolic function is normal. The right ventricular  size is normal. There is normal pulmonary artery systolic pressure.   3. Left atrial size was mildly dilated.   4. The mitral valve is abnormal. Trivial mitral valve regurgitation. No  evidence of mitral stenosis.   5. The aortic valve is tricuspid. Aortic valve regurgitation is not  visualized. No aortic stenosis is present.   6. The inferior vena cava is normal in size with greater than 50%  respiratory variability, suggesting right atrial pressure of 3 mmHg.    Past Medical History:  Diagnosis Date   Anemia    Hx   Anxiety    Chronic abdominal pain    Head ache     Past Surgical History:  Procedure Laterality Date    LAPAROSCOPIC GASTRIC SLEEVE RESECTION  08/24/2017   Dr. Darcella Gasman. Done in [place]   TONSILLECTOMY     As a child    MEDICATIONS:  diphenhydrAMINE (BENADRYL) 50 MG tablet   ferrous sulfate (FERROUSUL) 325 (65 FE) MG tablet   norethindrone (AYGESTIN) 5 MG tablet   predniSONE (DELTASONE) 50 MG tablet   Prenatal Vit-Fe Fumarate-FA (MULTIVITAMIN-PRENATAL) 27-0.8 MG TABS tablet   terbinafine (LAMISIL AT) 1 % cream   No current facility-administered medications for this encounter.   She is not currently taking prednisone.    Shonna Chock, PA-C Surgical Short Stay/Anesthesiology [facility] Phone [phone] [facility] Phone [phone] 03/30/2023 5:54 PM

## 2023-03-31 ENCOUNTER — Encounter (HOSPITAL_COMMUNITY): Payer: Self-pay

## 2023-03-31 NOTE — Anesthesia Preprocedure Evaluation (Addendum)
Anesthesia Evaluation  Patient identified by MRN, date of birth, ID band Patient awake    Reviewed: Allergy & Precautions, H&P , NPO status , Patient's Chart, lab work & pertinent test results  Airway Mallampati: III  TM Distance: >3 FB Neck ROM: Full    Dental  (+) Teeth Intact, Dental Advisory Given   Pulmonary neg pulmonary ROS   Pulmonary exam normal breath sounds clear to auscultation       Cardiovascular Normal cardiovascular exam Rhythm:Regular Rate:Normal  has been followed by cardiologist Dr. Servando Salina since 03/20/22 at the [facility] when she was evaluated for dyspnea and palpitations. 14 day monitor did not show any significant arrhythmias. 03/17/22 echo showed LVEF 60 to 65%, no regional wall motion abnormalities, normal LV diastolic parameters, normal RV systolic function, normal PASP, mildly dilated LA, trivial MR.  She reported syncope x2 associated with palpitations prior to 05/2022 visit and was started on propranolol with improvement in her palpitations and syncope. She had recurrent episodes of syncope x5 after the birth of her baby on 09/18/22. She saw Dr. Servando Salina on 03/24/23 with referral to EP planned for consideration of a loop recorder- scheduled for this in the next few weeks   Neuro/Psych  Headaches PSYCHIATRIC DISORDERS Anxiety     evaluated by neurologist Dr. Delena Bali on 04/08/22 for headaches. 05/02/22 MRI Brain showed a right anterolateral middle fossa lesion felt consistent with a benign osteoma or a heavily calcified meningioma. MRV was normal. MRI with contrast was recommended for better clarification following delivery of her infant. Marland Kitchen MRV was normal. Follow-up Brain MRI on 12/13/22 with and without contrast showed stable appearance of the cystic lesion along the anterolateral right middle cranial fossa of indeterminate etiology but atypical for arachnoid or epidermoid cyst.    GI/Hepatic negative GI ROS, Neg  liver ROS,,,  Endo/Other    Morbid obesityBMI 41  Renal/GU negative Renal ROS  negative genitourinary   Musculoskeletal  (+) Arthritis , Osteoarthritis,    Abdominal  (+) + obese  Peds negative pediatric ROS (+)  Hematology  (+) Blood dyscrasia, anemia Hb 10.6, plt 295   Anesthesia Other Findings   Reproductive/Obstetrics negative OB ROS                              Anesthesia Physical Anesthesia Plan  ASA: 3  Anesthesia Plan: General   Post-op Pain Management: Tylenol PO (pre-op)* and Toradol IV (intra-op)*   Induction: Intravenous  PONV Risk Score and Plan: 4 or greater and Ondansetron, Dexamethasone, Midazolam and Treatment may vary due to age or medical condition  Airway Management Planned: Oral ETT  Additional Equipment: None  Intra-op Plan:   Post-operative Plan: Extubation in OR  Informed Consent: I have reviewed the patients History and Physical, chart, labs and discussed the procedure including the risks, benefits and alternatives for the proposed anesthesia with the patient or authorized representative who has indicated his/her understanding and acceptance.     Dental advisory given  Plan Discussed with: CRNA  Anesthesia Plan Comments: ( )        Anesthesia Quick Evaluation

## 2023-04-05 NOTE — BH Specialist Note (Signed)
Integrated Behavioral Health via Telemedicine Visit  04/19/2023 Nancy Garrett [id number]  Number of Integrated Behavioral Health Clinician visits: Additional Visit  Session Start time: 1050   Session End time: 1152  Total time in minutes: 62   Referring Provider: Gerrit Heck, CNM Patient/Family location: Home [facility] Provider location: [facility]  All persons participating in visit: [name] and [facility] Carrolyn Hilmes   Types of Service: Individual psychotherapy and Video visit  I connected with Nancy Garrett and/or Nancy Mascot Fettes's  Garrett  via  Telephone or Video Enabled Telemedicine Application  (Video is Caregility application) and verified that I am speaking with the correct person using two identifiers. Discussed confidentiality: Yes   I discussed the limitations of telemedicine and the availability of in person appointments.  Discussed there is a possibility of technology failure and discussed alternative modes of communication if that failure occurs.  I discussed that engaging in this telemedicine visit, they consent to the provision of behavioral healthcare and the services will be billed under their insurance.  Patient and/or legal guardian expressed understanding and consented to Telemedicine visit: Yes   Presenting Concerns: Patient and/or family reports the following symptoms/concerns: Processing decision for recent tubal ligation, ongoing health concerns (headaches, heart issues, overheating, back pain,etc) and waking up from scary dreams.  Duration of problem: Ongoing; Severity of problem: moderate  Patient and/or Family's Strengths/Protective Factors: Social connections, Concrete supports in place (healthy food, safe environments, etc.), Sense of purpose, and Physical Health (exercise, healthy diet, medication compliance, etc.)  Goals Addressed: Patient will:  Reduce  symptoms of: anxiety and stress   Increase knowledge and/or ability of: stress reduction   Demonstrate ability to: Increase healthy adjustment to current life circumstances  Progress towards Goals: Ongoing  Interventions: Interventions utilized:  Supportive Reflection Standardized Assessments completed: Not Needed  Patient and/or Family Response: Patient agrees with treatment plan.   Assessment: Patient currently experiencing Adjustment disorder with anxious mood.   Patient may benefit from continued therapeutic intervention  .  Plan: Follow up with behavioral health clinician on : One month Behavioral recommendations:  -Continue daily healthy self-care and self-coping strategies (including fans/ice packs to prevent overheating; attending all medical appointments, routine meals and sleep; setting healthy boundaries daily, etc.) -Continue to consider options for housing to reduce stress, as discussed  Referral(s): Integrated [employer] (In Clinic)  I discussed the assessment and treatment plan with the patient and/or parent/guardian. They were provided an opportunity to ask questions and all were answered. They agreed with the plan and demonstrated an understanding of the instructions.   They were advised to call back or seek an in-person evaluation if the symptoms worsen or if the condition fails to improve as anticipated.  Nancy Lips, LCSW     12/01/2022    9:42 AM 08/19/2022   11:15 AM 06/17/2022    9:27 AM 03/09/2022    9:36 AM 02/24/2022   10:22 AM  Depression screen PHQ 2/9  Decreased Interest 0 0 0 0 0  Down, Depressed, Hopeless 0 0 0 0 0  PHQ - 2 Score 0 0 0 0 0  Altered sleeping 0 0 1 3 3   Tired, decreased energy 0 2 0 2 3  Change in appetite 0 0 0 0 3  Feeling bad or failure about yourself  0 0 0 0 0  Trouble concentrating 0 0 0 1 1  Moving slowly or fidgety/restless 0 0  0 0 2  Suicidal thoughts 0 0 0 0 0  PHQ-9 Score 0 2 1 6 12        12/01/2022    9:40 AM 08/19/2022   11:15 AM 06/17/2022    9:27 AM 06/16/2022    9:56 AM  GAD 7 : Generalized Anxiety Score  Nervous, Anxious, on Edge 1 0 1 1  Control/stop worrying 1 0 0 2  Worry too much - different things 1 0 0 0  Trouble relaxing 0 0 1 1  Restless 0 0 0 0  Easily annoyed or irritable 1 1 1 1   Afraid - awful might happen 1 0 0 1  Total GAD 7 Score 5 1 3  6

## 2023-04-06 ENCOUNTER — Ambulatory Visit (HOSPITAL_COMMUNITY): Payer: Medicaid Other | Admitting: Vascular Surgery

## 2023-04-06 ENCOUNTER — Other Ambulatory Visit: Payer: Self-pay

## 2023-04-06 ENCOUNTER — Encounter (HOSPITAL_COMMUNITY): Payer: Self-pay | Admitting: Family Medicine

## 2023-04-06 ENCOUNTER — Ambulatory Visit (HOSPITAL_COMMUNITY)
Admission: RE | Admit: 2023-04-06 | Discharge: 2023-04-06 | Disposition: A | Discharge status: Home / Self Care | Payer: Medicaid Other | Attending: Family Medicine | Admitting: Family Medicine

## 2023-04-06 ENCOUNTER — Encounter (HOSPITAL_COMMUNITY)
Admission: RE | Disposition: A | Discharge status: Home / Self Care | Payer: Self-pay | Source: Home / Self Care | Attending: Family Medicine

## 2023-04-06 ENCOUNTER — Other Ambulatory Visit (HOSPITAL_COMMUNITY): Payer: Self-pay

## 2023-04-06 ENCOUNTER — Ambulatory Visit (HOSPITAL_BASED_OUTPATIENT_CLINIC_OR_DEPARTMENT_OTHER): Payer: Medicaid Other | Admitting: Anesthesiology

## 2023-04-06 DIAGNOSIS — Z6841 Body Mass Index (BMI) 40.0 and over, adult: Secondary | ICD-10-CM | POA: Insufficient documentation

## 2023-04-06 DIAGNOSIS — Z302 Encounter for sterilization: Secondary | ICD-10-CM | POA: Diagnosis present

## 2023-04-06 DIAGNOSIS — N920 Excessive and frequent menstruation with regular cycle: Secondary | ICD-10-CM

## 2023-04-06 DIAGNOSIS — Z01818 Encounter for other preprocedural examination: Secondary | ICD-10-CM

## 2023-04-06 DIAGNOSIS — N939 Abnormal uterine and vaginal bleeding, unspecified: Secondary | ICD-10-CM | POA: Insufficient documentation

## 2023-04-06 HISTORY — PX: DILITATION & CURRETTAGE/HYSTROSCOPY WITH HYDROTHERMAL ABLATION: SHX5570

## 2023-04-06 HISTORY — PX: LAPAROSCOPIC BILATERAL SALPINGECTOMY: SHX5889

## 2023-04-06 LAB — POCT PREGNANCY, URINE: Preg Test, Ur: NEGATIVE

## 2023-04-06 SURGERY — DILATATION & CURETTAGE/HYSTEROSCOPY WITH HYDROTHERMAL ABLATION
Anesthesia: General | Site: Uterus

## 2023-04-06 MED ORDER — ACETAMINOPHEN 500 MG PO TABS
ORAL_TABLET | ORAL | Status: AC
  Administered 2023-04-06: 1000 mg via ORAL
  Filled 2023-04-06: qty 2

## 2023-04-06 MED ORDER — LIDOCAINE 2% (20 MG/ML) 5 ML SYRINGE
INTRAMUSCULAR | Status: AC
  Filled 2023-04-06: qty 5

## 2023-04-06 MED ORDER — ONDANSETRON HCL 4 MG/2ML IJ SOLN
INTRAMUSCULAR | Status: AC
  Filled 2023-04-06: qty 2

## 2023-04-06 MED ORDER — LACTATED RINGERS IV SOLN: INTRAVENOUS | Status: DC

## 2023-04-06 MED ORDER — HYDROMORPHONE HCL 1 MG/ML IJ SOLN
0.2500 mg | INTRAMUSCULAR | Status: DC | PRN
  Administered 2023-04-06: 0.5 mg via INTRAVENOUS

## 2023-04-06 MED ORDER — MIDAZOLAM HCL 2 MG/2ML IJ SOLN
INTRAMUSCULAR | Status: DC | PRN
  Administered 2023-04-06: 2 mg via INTRAVENOUS

## 2023-04-06 MED ORDER — FENTANYL CITRATE (PF) 250 MCG/5ML IJ SOLN
INTRAMUSCULAR | Status: DC | PRN
  Administered 2023-04-06 (×5): 50 ug via INTRAVENOUS

## 2023-04-06 MED ORDER — PROPOFOL 10 MG/ML IV BOLUS
INTRAVENOUS | Status: DC | PRN
  Administered 2023-04-06: 200 mg via INTRAVENOUS

## 2023-04-06 MED ORDER — PROPOFOL 500 MG/50ML IV EMUL
INTRAVENOUS | Status: DC | PRN
  Administered 2023-04-06: 150 ug/kg/min via INTRAVENOUS

## 2023-04-06 MED ORDER — ONDANSETRON HCL 4 MG/2ML IJ SOLN: 4.0000 mg | Freq: Once | INTRAMUSCULAR | Status: DC | PRN

## 2023-04-06 MED ORDER — LIDOCAINE 2% (20 MG/ML) 5 ML SYRINGE
INTRAMUSCULAR | Status: DC | PRN
  Administered 2023-04-06: 60 mg via INTRAVENOUS

## 2023-04-06 MED ORDER — OXYCODONE HCL 5 MG PO TABS: 5.0000 mg | ORAL_TABLET | Freq: Once | ORAL | Status: DC | PRN

## 2023-04-06 MED ORDER — CHLORHEXIDINE GLUCONATE 0.12 % MT SOLN
OROMUCOSAL | Status: AC
  Administered 2023-04-06: 15 mL via OROMUCOSAL
  Filled 2023-04-06: qty 15

## 2023-04-06 MED ORDER — BACITRACIN ZINC 500 UNIT/GM EX OINT
TOPICAL_OINTMENT | CUTANEOUS | Status: AC
  Filled 2023-04-06: qty 28.35

## 2023-04-06 MED ORDER — CHLORHEXIDINE GLUCONATE 0.12 % MT SOLN: 15.0000 mL | Freq: Once | OROMUCOSAL | Status: AC

## 2023-04-06 MED ORDER — MIDAZOLAM HCL 2 MG/2ML IJ SOLN
INTRAMUSCULAR | Status: AC
  Filled 2023-04-06: qty 2

## 2023-04-06 MED ORDER — POVIDONE-IODINE 10 % EX SWAB: 2.0000 | Freq: Once | CUTANEOUS | Status: DC

## 2023-04-06 MED ORDER — ONDANSETRON HCL 4 MG/2ML IJ SOLN
INTRAMUSCULAR | Status: DC | PRN
  Administered 2023-04-06: 4 mg via INTRAVENOUS

## 2023-04-06 MED ORDER — ORAL CARE MOUTH RINSE: 15.0000 mL | Freq: Once | OROMUCOSAL | Status: AC

## 2023-04-06 MED ORDER — KETOROLAC TROMETHAMINE 30 MG/ML IJ SOLN
INTRAMUSCULAR | Status: DC | PRN
Start: 2023-04-06 — End: 2023-04-06
  Administered 2023-04-06: 30 mg via INTRAVENOUS

## 2023-04-06 MED ORDER — OXYCODONE HCL 5 MG/5ML PO SOLN: 5.0000 mg | Freq: Once | ORAL | Status: DC | PRN

## 2023-04-06 MED ORDER — ACETAMINOPHEN 500 MG PO TABS: 1000.0000 mg | ORAL_TABLET | ORAL | Status: AC

## 2023-04-06 MED ORDER — DEXAMETHASONE SODIUM PHOSPHATE 10 MG/ML IJ SOLN
INTRAMUSCULAR | Status: DC | PRN
  Administered 2023-04-06: 10 mg via INTRAVENOUS

## 2023-04-06 MED ORDER — DEXAMETHASONE SODIUM PHOSPHATE 10 MG/ML IJ SOLN
INTRAMUSCULAR | Status: AC
  Filled 2023-04-06: qty 1

## 2023-04-06 MED ORDER — LIDOCAINE HCL 1 % IJ SOLN
INTRAMUSCULAR | Status: DC | PRN
  Administered 2023-04-06: 20 mL

## 2023-04-06 MED ORDER — FENTANYL CITRATE (PF) 250 MCG/5ML IJ SOLN
INTRAMUSCULAR | Status: AC
  Filled 2023-04-06: qty 5

## 2023-04-06 MED ORDER — SUGAMMADEX SODIUM 200 MG/2ML IV SOLN: INTRAVENOUS | Status: DC | PRN

## 2023-04-06 MED ORDER — SODIUM CHLORIDE 0.9 % IR SOLN
Status: DC | PRN
  Administered 2023-04-06: 3000 mL

## 2023-04-06 MED ORDER — BUPIVACAINE HCL (PF) 0.25 % IJ SOLN
INTRAMUSCULAR | Status: DC | PRN
  Administered 2023-04-06: 27 mL

## 2023-04-06 MED ORDER — ROCURONIUM BROMIDE 10 MG/ML (PF) SYRINGE
PREFILLED_SYRINGE | INTRAVENOUS | Status: DC | PRN
  Administered 2023-04-06: 80 mg via INTRAVENOUS
  Administered 2023-04-06: 20 mg via INTRAVENOUS

## 2023-04-06 MED ORDER — SUGAMMADEX SODIUM 200 MG/2ML IV SOLN
INTRAVENOUS | Status: DC | PRN
  Administered 2023-04-06: 300 mg via INTRAVENOUS

## 2023-04-06 MED ORDER — PROPOFOL 10 MG/ML IV BOLUS
INTRAVENOUS | Status: AC
  Filled 2023-04-06: qty 20

## 2023-04-06 MED ORDER — HYDROMORPHONE HCL 1 MG/ML IJ SOLN
INTRAMUSCULAR | Status: AC
  Filled 2023-04-06: qty 1

## 2023-04-06 MED ORDER — LIDOCAINE HCL (PF) 1 % IJ SOLN
INTRAMUSCULAR | Status: AC
  Filled 2023-04-06: qty 30

## 2023-04-06 MED ORDER — AMISULPRIDE (ANTIEMETIC) 5 MG/2ML IV SOLN: 10.0000 mg | Freq: Once | INTRAVENOUS | Status: DC | PRN

## 2023-04-06 MED ORDER — ROCURONIUM BROMIDE 10 MG/ML (PF) SYRINGE
PREFILLED_SYRINGE | INTRAVENOUS | Status: AC
  Filled 2023-04-06: qty 10

## 2023-04-06 MED ORDER — SODIUM CHLORIDE 0.9 % IV SOLN: INTRAVENOUS | Status: DC | PRN

## 2023-04-06 MED ORDER — OXYCODONE HCL 5 MG PO TABS
5.0000 mg | ORAL_TABLET | Freq: Four times a day (QID) | ORAL | 0 refills | Status: DC | PRN
  Filled 2023-04-06: qty 5, 2d supply, fill #0

## 2023-04-06 SURGICAL SUPPLY — 39 items
ADH SKN CLS APL DERMABOND .7 (GAUZE/BANDAGES/DRESSINGS) ×2
CANISTER SUCT 3000ML PPV (MISCELLANEOUS) ×2 IMPLANT
CATH ROBINSON RED A/P 16FR (CATHETERS) ×2 IMPLANT
DERMABOND ADVANCED .7 DNX12 (GAUZE/BANDAGES/DRESSINGS) ×2 IMPLANT
DRSG OPSITE POSTOP 3X4 (GAUZE/BANDAGES/DRESSINGS) IMPLANT
DURAPREP 26ML APPLICATOR (WOUND CARE) ×2 IMPLANT
GLOVE BIOGEL PI IND STRL 7.0 (GLOVE) ×8 IMPLANT
GLOVE ECLIPSE 7.0 STRL STRAW (GLOVE) ×2 IMPLANT
GLOVE SURG ENC MOIS LTX SZ7 (GLOVE) ×2 IMPLANT
GLOVE SURG UNDER POLY LF SZ7 (GLOVE) ×4 IMPLANT
GOWN STRL REUS W/ TWL LRG LVL3 (GOWN DISPOSABLE) ×6 IMPLANT
GOWN STRL REUS W/TWL LRG LVL3 (GOWN DISPOSABLE) ×6
IRRIG SUCT STRYKERFLOW 2 WTIP (MISCELLANEOUS) ×2
IRRIGATION SUCT STRKRFLW 2 WTP (MISCELLANEOUS) ×2 IMPLANT
KIT PINK PAD W/HEAD ARE REST (MISCELLANEOUS) ×2
KIT PINK PAD W/HEAD ARM REST (MISCELLANEOUS) ×2 IMPLANT
KIT TURNOVER KIT B (KITS) ×2 IMPLANT
PACK LAPAROSCOPY BASIN (CUSTOM PROCEDURE TRAY) ×2 IMPLANT
PACK VAGINAL MINOR WOMEN LF (CUSTOM PROCEDURE TRAY) ×2 IMPLANT
PAD OB MATERNITY 4.3X12.25 (PERSONAL CARE ITEMS) ×2 IMPLANT
PROTECTOR NERVE ULNAR (MISCELLANEOUS) ×4 IMPLANT
SET GENESYS HTA PROCERVA (MISCELLANEOUS) ×2 IMPLANT
SET TUBE SMOKE EVAC HIGH FLOW (TUBING) ×2 IMPLANT
SHEARS HARMONIC ACE PLUS 36CM (ENDOMECHANICALS) IMPLANT
SLEEVE Z-THREAD 5X100MM (TROCAR) ×2 IMPLANT
SPONGE T-LAP 18X18 ~~LOC~~+RFID (SPONGE) ×2 IMPLANT
SUT VIC AB 3-0 PS2 18 (SUTURE) ×2
SUT VIC AB 3-0 PS2 18XBRD (SUTURE) ×2 IMPLANT
SUT VICRYL 0 UR6 27IN ABS (SUTURE) ×4 IMPLANT
SUT VICRYL 4-0 PS2 18IN ABS (SUTURE) ×2 IMPLANT
SYS BAG RETRIEVAL 10MM (BASKET)
SYSTEM BAG RETRIEVAL 10MM (BASKET) IMPLANT
TOWEL GREEN STERILE FF (TOWEL DISPOSABLE) ×4 IMPLANT
TRAY FOLEY W/BAG SLVR 14FR (SET/KITS/TRAYS/PACK) ×2 IMPLANT
TROCAR 11X100 Z THREAD (TROCAR) IMPLANT
TROCAR BALLN 12MMX100 BLUNT (TROCAR) ×2 IMPLANT
TROCAR XCEL NON-BLD 5MMX100MML (ENDOMECHANICALS) ×2 IMPLANT
UNDERPAD 30X36 HEAVY ABSORB (UNDERPADS AND DIAPERS) ×2 IMPLANT
WARMER LAPAROSCOPE (MISCELLANEOUS) ×2 IMPLANT

## 2023-04-06 NOTE — Anesthesia Procedure Notes (Signed)
Procedure Name: Intubation Date/Time: 04/06/2023 12:40 PM  Performed by: Stanton Kidney, CRNAPre-anesthesia Checklist: Patient identified, Patient being monitored, Timeout performed, Emergency Drugs available and Suction available Patient Re-evaluated:Patient Re-evaluated prior to induction Oxygen Delivery Method: Circle system utilized Preoxygenation: Pre-oxygenation with 100% oxygen Induction Type: IV induction Ventilation: Mask ventilation without difficulty Laryngoscope Size: Mac and 3 Grade View: Grade I Tube type: Oral Tube size: 7.0 mm Number of attempts: 1 Airway Equipment and Method: Stylet Placement Confirmation: ETT inserted through vocal cords under direct vision, positive ETCO2 and breath sounds checked- equal and bilateral Secured at: 21 cm Tube secured with: Tape Dental Injury: Teeth and Oropharynx as per pre-operative assessment

## 2023-04-06 NOTE — Anesthesia Postprocedure Evaluation (Signed)
Anesthesia Post Note  Patient: Nancy Garrett  Procedure(s) Performed: DILATATION & CURETTAGE/HYSTEROSCOPY WITH HYDROTHERMAL ABLATION (Uterus) LAPAROSCOPIC BILATERAL SALPINGECTOMY (Bilateral: Abdomen)     Patient location during evaluation: Phase II Anesthesia Type: General Level of consciousness: awake and alert, oriented and patient cooperative Pain management: pain level controlled Vital Signs Assessment: post-procedure vital signs reviewed and stable Respiratory status: spontaneous breathing, nonlabored ventilation and respiratory function stable Cardiovascular status: blood pressure returned to baseline and stable Postop Assessment: no apparent nausea or vomiting Anesthetic complications: no   No notable events documented.  Last Vitals:  Vitals:   04/06/23 1445 04/06/23 1500  BP: 117/73 119/81  Pulse: 79 66  Resp: 17 13  Temp:  36.8 C  SpO2: 93% 100%    Last Pain:  Vitals:   04/06/23 1500  TempSrc:   PainSc: 0-No pain   Pain Goal:                   Lannie Fields

## 2023-04-06 NOTE — Transfer of Care (Signed)
Immediate Anesthesia Transfer of Care Note  Patient: Nancy Garrett  Procedure(s) Performed: DILATATION & CURETTAGE/HYSTEROSCOPY WITH HYDROTHERMAL ABLATION (Uterus) LAPAROSCOPIC BILATERAL SALPINGECTOMY (Bilateral: Abdomen)  Patient Location: PACU  Anesthesia Type:General  Level of Consciousness: awake, alert , oriented, and patient cooperative  Airway & Oxygen Therapy: Patient Spontanous Breathing and Patient connected to face mask oxygen  Post-op Assessment: Report given to RN, Post -op Vital signs reviewed and stable, and Patient moving all extremities X 4  Post vital signs: Reviewed and stable  Last Vitals:  Vitals Value Taken Time  BP 117/80 04/06/23 1431  Temp    Pulse 90 04/06/23 1437  Resp 13 04/06/23 1437  SpO2 96 % 04/06/23 1437  Vitals shown include unfiled device data.  Last Pain:  Vitals:   04/06/23 1106  TempSrc:   PainSc: 0-No pain         Complications: No notable events documented.

## 2023-04-06 NOTE — Interval H&P Note (Signed)
History and Physical Interval Note:  04/06/2023 11:37 AM  Nancy Garrett  has presented today for surgery, with the diagnosis of Menorrhagia, Undesired Fertility.  The various methods of treatment have been discussed with the patient and family. After consideration of risks, benefits and other options for treatment, the patient has consented to  Procedure(s): DILATATION & CURETTAGE/HYSTEROSCOPY WITH HYDROTHERMAL ABLATION (N/A) LAPAROSCOPIC BILATERAL SALPINGECTOMY (Bilateral) as a surgical intervention.  The patient's history has been reviewed, patient examined, no change in status, stable for surgery.  I have reviewed the patient's chart and labs.  Questions were answered to the patient's satisfaction.     Reva Bores

## 2023-04-06 NOTE — Op Note (Signed)
PROCEDURE DATE: 04/06/2023  PREOPERATIVE DIAGNOSES: Abnormal uterine bleeding, Undesired fertility  POSTOPERATIVE DIAGNOSES: The same   PROCEDURE: Laparoscopic bilateral salpingectomy, dilation and curettage with hysteroscopy and hydrothermal ablation.   SURGEON: Dr. Reva Bores   ASSISTANT: None  ANESTHESIOLOGIST: Lannie Fields, DO MD - GETT  INDICATIONS: 32 y.o. [phone] who has heavy bleeding, and desired treatment and also desired permanent sterilization.  FINDINGS: Normal appearing uterus, tubes and ovaries, normal appearing uterine cavity with both ostia seen   ESTIMATED BLOOD LOSS: 25 ml   SPECIMENS: Bilateral fallopian tubes and endometrial curetting's to pathology  COMPLICATIONS: None immediately known   PROCEDURE IN DETAIL: The patient had sequential compression devices applied to her lower extremities while in the preoperative area. She was then taken to the operating room where general anesthesia was administered and was found to be adequate. She was placed in the dorsal lithotomy position, and was prepped and draped in a sterile manner. A Red rubber catheter was inserted into her bladder and drained a clear unknown amount of urine. A speculum was placed inside the vagina, and the cervix grasped with an single tooth tenaculum. A Hulka tenaculum was placed through the cervix for uterine manipulation. Attention was then turned to the patient's abdomen where a 5-mm skin incision was made in the umbilicus.  A viewable trocar used to confirm entry.  Intraperitoneal placement was confirmed and insufflation done. A survey of the patient's pelvis and abdomen revealed the findings above. Two left sided 5-mm lower quadrant ports were then placed under direct visualization. On the rightt side, the tube was separated from the mesosalpinx with the Harmonic device.  On the leftt side, the tube was separated from the mesosalpinx with the Harmonic device.  The specimen was then removed  from the abdomen through a 5-mm port, under direct visualization. The operative site was surveyed, and found to be hemostatic. No intraoperative injury to other surrounding organs was noted. The abdomen was desufflated and all instruments were then removed from the patient's abdomen.  All skin incisions were closed with 3-0 Vicryl subcuticular stitches/Dermabond.   Attention was turned to the vagina. The Hulka was removed from the uterus. A speculum was placed inside the vagina and the cervix visualized. The cervix was grasped anteriorly with a single-tooth tenaculum. 20 cc of one percent Lidocaine were injected for paracervical block. Sequential dilation was done to a #17 dilator, and the HTA with hysteroscope was introduced into the uterine cavity. The cervix and endometrial lining appeared normal both ostia were seen there was no deformity of the cavity. A seal test was done and was passed. The uterine cavity was heated to between 80 and 90C and HTA was performed for 10 minutes. Cooling was then performed and the instrument removed. Sharp curettage was then performed and sample sent to pathology. All instrument, needle, and lap counts were correct x2. The patient was awakened taken to recovery room in stable condition.  Reva Bores MD 04/06/2023 2:16 PM

## 2023-04-07 ENCOUNTER — Encounter (HOSPITAL_COMMUNITY): Payer: Self-pay | Admitting: Family Medicine

## 2023-04-08 LAB — SURGICAL PATHOLOGY

## 2023-04-12 ENCOUNTER — Ambulatory Visit: Payer: Commercial Managed Care - HMO | Admitting: Cardiology

## 2023-04-14 ENCOUNTER — Ambulatory Visit: Payer: Commercial Managed Care - HMO | Admitting: Psychiatry

## 2023-04-15 ENCOUNTER — Encounter: Payer: Self-pay | Admitting: Neurology

## 2023-04-15 ENCOUNTER — Ambulatory Visit: Payer: Managed Care, Other (non HMO) | Admitting: Neurology

## 2023-04-15 VITALS — BP 123/79 | HR 98 | Ht 66.0 in | Wt 258.5 lb

## 2023-04-15 DIAGNOSIS — R9389 Abnormal findings on diagnostic imaging of other specified body structures: Secondary | ICD-10-CM

## 2023-04-15 DIAGNOSIS — G43709 Chronic migraine without aura, not intractable, without status migrainosus: Secondary | ICD-10-CM | POA: Insufficient documentation

## 2023-04-15 MED ORDER — SUMATRIPTAN SUCCINATE 50 MG PO TABS: 50.0000 mg | ORAL_TABLET | ORAL | 6 refills | Status: DC | PRN

## 2023-04-15 NOTE — Progress Notes (Signed)
Chief Complaint  Patient presents with   Migraine    Rm14, two daughters present, Migraine: 4-5 daily, triggers unidentifiable      ASSESSMENT AND PLAN  Nancy Garrett is a 32 y.o. adult    Chronic migraines Frequent headaches Obesity  Risk for intracranial hypertension, refer to ophthalmologist for further evaluation  Sumatriptan 50 mg as needed may discard her breast milk 24 hours after use  Once she stopped breast-feeding, may consider daily preventive medication  Return To Clinic With NP In 6 Months  DIAGNOSTIC DATA (LABS, IMAGING, TESTING) - I reviewed patient records, labs, notes, testing and imaging myself where available.   MEDICAL HISTORY:  Nancy Garrett, is a 32 year old female seen in request by her primary care nurse practitioner Ngetich, Dinah for evaluation of migraine headache initial evaluation April 15, 2023   History is obtained from the patient and review of electronic medical records. I personally reviewed pertinent available imaging films in PACS.   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,   She had long history of chronic migraine, increased migraine headache during her first pregnancy in 2017, could not function well, to the point have bed resting, her headache improved postpartum  Began to have frequent headaches again during her recent pregnancy in November 2023, has been persistent despite delivery in April 2024  3 to 4 days out of the week she would have moderate headaches, with weather changes in September 2024 she began to have daily pressure headaches, multiple times a day, pressure, bilateral frontal headache, with some light noise sensitivity, tried over-the-counter medication with limited help  She denies significant visual change, had a gastric bypass surgery in the past, with significant weight loss with some weight gain again with her second pregnancy,      PHYSICAL EXAM:   Vitals:   04/15/23 1016  BP:  123/79  Pulse: 98  Weight: 258 lb 8 oz (117.3 kg)  Height: 5\' 6"  (1.676 m)   Not recorded     Body mass index is 41.72 kg/m.  PHYSICAL EXAMNIATION:  Gen: NAD, conversant, well nourised, well groomed                     Cardiovascular: Regular rate rhythm, no peripheral edema, warm, nontender. Eyes: Conjunctivae clear without exudates or hemorrhage Neck: Supple, no carotid bruits. Pulmonary: Clear to auscultation bilaterally   NEUROLOGICAL EXAM:  MENTAL STATUS: Speech/cognition: Awake, alert, oriented to history taking and casual conversation CRANIAL NERVES: CN II: Visual fields are full to confrontation. Pupils are round equal and briskly reactive to light.  I was able to take a glimpse of her optic disc, seems to have blurry edge CN III, IV, VI: extraocular movement are normal. No ptosis. CN V: Facial sensation is intact to light touch CN VII: Face is symmetric with normal eye closure  CN VIII: Hearing is normal to causal conversation. CN IX, X: Phonation is normal. CN XI: Head turning and shoulder shrug are intact  MOTOR: There is no pronator drift of out-stretched arms. Muscle bulk and tone are normal. Muscle strength is normal.  REFLEXES: Reflexes are 2+ and symmetric at the biceps, triceps, knees, and ankles. Plantar responses are flexor.  SENSORY: Intact to light touch, pinprick and vibratory sensation are intact in fingers and toes.  COORDINATION: There is no trunk or limb dysmetria noted.  GAIT/STANCE: Posture is normal. Gait is steady with normal steps, base, arm swing, and turning. Heel and toe  walking are normal. Tandem gait is normal.  Romberg is absent.  REVIEW OF SYSTEMS:  Full 14 system review of systems performed and notable only for as above All other review of systems were negative.   ALLERGIES: Allergies  Allergen Reactions   Gadolinium Hives, Itching and Nausea And Vomiting    Pt reports she has allergy to MRI contrast not CT contrast.  AV, RN 11/24/22   Iodinated Contrast Media    Ivp Dye [Iodinated Contrast Media] Nausea And Vomiting    HOME MEDICATIONS: Current Outpatient Medications  Medication Sig Dispense Refill   diphenhydrAMINE (BENADRYL) 50 MG tablet Take 1 tablet (50 mg total) by mouth once for 1 dose. Pt to take 50 mg of benadryl on 12/07/22 at 10:10 AM. Please call [phone] with any questions. 1 tablet 0   ferrous sulfate (FERROUSUL) 325 (65 FE) MG tablet Take 1 tablet (325 mg total) by mouth every other day. 30 tablet 3   norethindrone (AYGESTIN) 5 MG tablet Take 1 tablet (5 mg total) by mouth daily. 60 tablet 2   oxyCODONE (OXY IR/ROXICODONE) 5 MG immediate release tablet Take 1 tablet (5 mg total) by mouth every 6 (six) hours as needed for severe pain (pain score 7-10). 5 tablet 0   predniSONE (DELTASONE) 50 MG tablet Pt to take 50 mg of prednisone on 12/06/22 at 10:10 PM, 50 mg of prednisone on 12/07/22 at 4:10 AM, and 50 mg of prednisone on 12/07/22 at 10:10 AM. Pt is also to take 50 mg of benadryl on 12/07/22 at 10:10 AM. Please call [phone] with any questions. 3 tablet 0   Prenatal Vit-Fe Fumarate-FA (MULTIVITAMIN-PRENATAL) 27-0.8 MG TABS tablet Take 1 tablet by mouth daily.     terbinafine (LAMISIL AT) 1 % cream Apply 1 Application topically 2 (two) times daily. 30 g 0   No current facility-administered medications for this visit.    PAST MEDICAL HISTORY: Past Medical History:  Diagnosis Date   Anemia    Hx   Anxiety    Chronic abdominal pain    Head ache    Syncope    cardiology and neurology evaluations ~ 03/2022    PAST SURGICAL HISTORY: Past Surgical History:  Procedure Laterality Date   DILITATION & CURRETTAGE/HYSTROSCOPY WITH HYDROTHERMAL ABLATION N/A 04/06/2023   Procedure: DILATATION & CURETTAGE/HYSTEROSCOPY WITH HYDROTHERMAL ABLATION;  Surgeon: Reva Bores, MD;  Location: [facility] OR;  Service: Gynecology;  Laterality: N/A;   LAPAROSCOPIC BILATERAL SALPINGECTOMY Bilateral 04/06/2023    Procedure: LAPAROSCOPIC BILATERAL SALPINGECTOMY;  Surgeon: Reva Bores, MD;  Location: [facility] OR;  Service: Gynecology;  Laterality: Bilateral;   LAPAROSCOPIC GASTRIC SLEEVE RESECTION  08/24/2017   Dr. Darcella Gasman. Done in [place]   TONSILLECTOMY     As a child    FAMILY HISTORY: Family History  Problem Relation Age of Onset   High blood pressure Mother    Diabetes Father    Diabetes Sister    Stroke Sister    High blood pressure Sister     SOCIAL HISTORY: Social History   Socioeconomic History   Marital status: Married    Spouse name: Not on file   Number of children: Not on file   Years of education: Not on file   Highest education level: Not on file  Occupational History   Not on file  Tobacco Use   Smoking status: Never   Smokeless tobacco: Never  Vaping Use   Vaping status: Never Used  Substance and Sexual Activity   Alcohol  use: Not Currently    Comment: Occasionally   Drug use: Never   Sexual activity: Yes  Other Topics Concern   Not on file  Social History Narrative   ** Merged History Encounter **       Tobacco use, amount per day now: Past tobacco use, amount per day: How many years did you use tobacco: Alcohol use (drinks per week): Diet: Do you drink/eat things with caffeine: No Marital status:   Married                               What year    were you married? 2022 Do you live in a house, apartment, assisted living, condo, trailer, etc.? House Is it one or more stories? One How many persons live in your home? 3  Do you have pets in your home?( please list) No Highest Level of education c   ompleted? College Current or past profession: Associate Professor, Scientist, research (physical sciences). Do you exercise? No                                 Type and how often? Do you have a living will? Yes Do you have a DNR form?   No                               If not, do you    want to discuss one? Do you have signed POA/HPOA forms?    No                    If so, please  bring to you appointment  Do you have any difficulty bathing or dressing yourself? Yes Do you have any difficulty preparing food or eating? No Do you hav   e any difficulty managing your medications? No Do you have any difficulty managing your finances? No Do you have any difficulty affording your medications? No   Social Determinants of Health   Financial Resource Strain: Not on file  Food Insecurity: Food Insecurity Present (09/18/2022)   Hunger Vital Sign    Worried About Running Out of Food in the Last Year: Never true    Ran Out of Food in the Last Year: Sometimes true  Transportation Needs: No Transportation Needs (09/18/2022)   PRAPARE - Administrator, Civil Service (Medical): No    Lack of Transportation (Non-Medical): No  Physical Activity: Not on file  Stress: Not on file  Social Connections: Unknown (10/13/2021)   [facility] from [facility]   Social Network    Social Network: Not on file  Intimate Partner Violence: Not At Risk (09/18/2022)   Humiliation, Afraid, Rape, and Kick questionnaire    Fear of Current or Ex-Partner: No    Emotionally Abused: No    Physically Abused: No    Sexually Abused: No      Levert Feinstein, M.D. Ph.D.  [facility] [address], [address] [place], [place] [address] Ph: [phone] Fax: [phone]  CC:  Caesar Bookman, NP [address] [place],  [place] [address]  Ngetich, Donalee Citrin, NP

## 2023-04-19 ENCOUNTER — Ambulatory Visit (INDEPENDENT_AMBULATORY_CARE_PROVIDER_SITE_OTHER): Payer: 59 | Admitting: Clinical

## 2023-04-19 ENCOUNTER — Telehealth: Payer: Self-pay | Admitting: Neurology

## 2023-04-19 DIAGNOSIS — F4322 Adjustment disorder with anxiety: Secondary | ICD-10-CM | POA: Diagnosis not present

## 2023-04-19 NOTE — Telephone Encounter (Signed)
Referral for ophthalmology fax to [facility]. Phone: [phone], Fax: [phone]. 

## 2023-05-03 ENCOUNTER — Encounter: Payer: Self-pay | Admitting: Cardiology

## 2023-05-03 ENCOUNTER — Ambulatory Visit: Payer: Medicaid Other | Attending: Cardiology | Admitting: Cardiology

## 2023-05-03 ENCOUNTER — Other Ambulatory Visit (INDEPENDENT_AMBULATORY_CARE_PROVIDER_SITE_OTHER): Payer: Medicaid Other

## 2023-05-03 VITALS — BP 120/86 | HR 70 | Ht 66.0 in | Wt 264.4 lb

## 2023-05-03 DIAGNOSIS — R55 Syncope and collapse: Secondary | ICD-10-CM | POA: Diagnosis not present

## 2023-05-03 DIAGNOSIS — R002 Palpitations: Secondary | ICD-10-CM | POA: Diagnosis not present

## 2023-05-03 NOTE — Progress Notes (Signed)
  Electrophysiology Office Note:   Date:  05/03/2023  ID:  Nancy Garrett, DOB 1991-04-20, MRN [id number]  Primary Cardiologist: Thomasene Ripple, DO Electrophysiologist: None      History of Present Illness:   Nancy Garrett is a 32 y.o. adult with h/o shortness of breath, palpitations, seen today for  for Electrophysiology evaluation of palpitations at the request of Kardie Tobb.    She has been having episodes of palpitations and syncope.  Episodes occurred quite frequently when she was pregnant with her daughter who is 7 months.  Over the course of the last few months, episodes of more frequent.  No exacerbating or alleviating symptoms.  She does have chronic headaches.  Prior to the episodes of syncope, she feels a warm sensation over her entire body.  If she can cool off quickly, she can avoid these episodes.  She states that she has been unconscious for multiple minutes at a time.  Review of systems complete and found to be negative unless listed in HPI.   EP Information / Studies Reviewed:    EKG is not ordered today. EKG from 03/24/23 reviewed which showed sinus rhythm        Risk Assessment/Calculations:              Physical Exam:   VS:  BP 120/86 (BP Location: Left Arm, Patient Position: Sitting, Cuff Size: Large)   Pulse 70   Ht 5\' 6"  (1.676 m)   Wt 264 lb 6.4 oz (119.9 kg)   SpO2 98%   BMI 42.68 kg/m    Wt Readings from Last 3 Encounters:  05/03/23 264 lb 6.4 oz (119.9 kg)  04/15/23 258 lb 8 oz (117.3 kg)  04/06/23 254 lb (115.2 kg)     GEN: Well nourished, well developed in no acute distress NECK: No JVD; No carotid bruits CARDIAC: Regular rate and rhythm, no murmurs, rubs, gallops RESPIRATORY:  Clear to auscultation without rales, wheezing or rhonchi  ABDOMEN: Soft, non-tender, non-distended EXTREMITIES:  No edema; No deformity   ASSESSMENT AND PLAN:    1.  Palpitations with recurrent syncope: Unclear cause to her syncope.  She has worn a  cardiac monitor in the past which was unrevealing.  She is having episodes every few days.  Aldous Housel plan for 14-day monitor.  If this is unrevealing and she has no symptoms while wearing the monitor, she would likely benefit from ILR implant.  We did discuss no driving for 6 months per [facility] restrictions.  Follow up with Dr. Elberta Fortis  pending monitor results   Signed, Philo Kurtz Jorja Loa, MD

## 2023-05-03 NOTE — Progress Notes (Unsigned)
Enrolled for [employer] to mail a ZIO AT Live Telemetry monitor to patients address on file.  

## 2023-05-03 NOTE — Patient Instructions (Signed)
Medication Instructions:  Your physician recommends that you continue on your current medications as directed. Please refer to the Current Medication list given to you today.  *If you need a refill on your cardiac medications before your next appointment, please call your pharmacy*   Lab Work: None ordered   Testing/Procedures:                           ZIO XT- Long Term Monitor Instructions  Your physician has requested you wear a ZIO patch monitor for 14 days.  This is a single patch monitor. [employer] supplies one patch monitor per enrollment. Additional stickers are not available. Please do not apply patch if you will be having a Nuclear Stress Test,  Echocardiogram, Cardiac CT, MRI, or Chest Xray during the period you would be wearing the  monitor. The patch cannot be worn during these tests. You cannot remove and re-apply the  ZIO XT patch monitor.  Your ZIO patch monitor will be mailed 3 day USPS to your address on file. It may take 3-5 days  to receive your monitor after you have been enrolled.  Once you have received your monitor, please review the enclosed instructions. Your monitor  has already been registered assigning a specific monitor serial # to you.  Billing and Patient Assistance Program Information  We have supplied [employer] with any of your insurance information on file for billing purposes. [employer] offers a sliding scale Patient Assistance Program for patients that do not have  insurance, or whose insurance does not completely cover the cost of the ZIO monitor.  You must apply for the Patient Assistance Program to qualify for this discounted rate.  To apply, please call [employer] at [phone], select option 4, select option 2, ask to apply for  Patient Assistance Program. Meredeth Ide will ask your household income, and how many people  are in your household. They will quote your out-of-pocket cost based on that information.  [employer] will also be able to set up a  80-month, interest-free payment plan if needed.  Applying the monitor   Shave hair from upper left chest.  Hold abrader disc by orange tab. Rub abrader in 40 strokes over the upper left chest as  indicated in your monitor instructions.  Clean area with 4 enclosed alcohol pads. Let dry.  Apply patch as indicated in monitor instructions. Patch will be placed under collarbone on left  side of chest with arrow pointing upward.  Rub patch adhesive wings for 2 minutes. Remove white label marked "1". Remove the white  label marked "2". Rub patch adhesive wings for 2 additional minutes.  While looking in a mirror, press and release button in center of patch. A small green light will  flash 3-4 times. This will be your only indicator that the monitor has been turned on.  Do not shower for the first 24 hours. You may shower after the first 24 hours.  Press the button if you feel a symptom. You will hear a small click. Record Date, Time and  Symptom in the Patient Logbook.  When you are ready to remove the patch, follow instructions on the last 2 pages of Patient  Logbook. Stick patch monitor onto the last page of Patient Logbook.  Place Patient Logbook in the blue and white box. Use locking tab on box and tape box closed  securely. The blue and white box has prepaid postage on it. Please place it in the  mailbox as  soon as possible. Your physician should have your test results approximately 7 days after the  monitor has been mailed back to [facility].  Call [facility] Customer Care at [phone] if you have questions regarding  your ZIO XT patch monitor. Call them immediately if you see an orange light blinking on your  monitor.  If your monitor falls off in less than 4 days, contact our Monitor department at [phone].  If your monitor becomes loose or falls off after 4 days call [employer] at [phone] for  suggestions on securing your monitor   Follow-Up: At [facility],  you and your health needs are our priority.  As part of our continuing mission to provide you with exceptional heart care, we have created designated Provider Care Teams.  These Care Teams include your primary Cardiologist (physician) and Advanced Practice Providers (APPs -  Physician Assistants and Nurse Practitioners) who all work together to provide you with the care you need, when you need it.  Your next appointment:   to be  determined  The format for your next appointment:   In Person  Provider:   Loman Brooklyn, MD    Thank you for choosing [facility]!!   Dory Horn, RN [phone]

## 2023-05-05 ENCOUNTER — Ambulatory Visit: Payer: Medicaid Other | Admitting: Family Medicine

## 2023-05-05 ENCOUNTER — Encounter: Payer: Self-pay | Admitting: Family Medicine

## 2023-05-05 VITALS — BP 104/71 | HR 76

## 2023-05-05 DIAGNOSIS — N920 Excessive and frequent menstruation with regular cycle: Secondary | ICD-10-CM

## 2023-05-05 DIAGNOSIS — Z302 Encounter for sterilization: Secondary | ICD-10-CM

## 2023-05-05 NOTE — Assessment & Plan Note (Signed)
Discussed HTA and usual cycles after.

## 2023-05-05 NOTE — Progress Notes (Signed)
Follow up:  Pt stating that she has some spotting to moderate bleeding and still notices a smell

## 2023-05-05 NOTE — Assessment & Plan Note (Signed)
S/p salpingectomy. Healing well.

## 2023-05-05 NOTE — BH Specialist Note (Signed)
ADULT Comprehensive Clinical Assessment (CCA) Note   05/18/2023 Laurell Roof [id number]   Referring Provider: Gerrit Heck, CNM Session Start time: 1017    Session End time: 1103  Total time in minutes: 46  [facility]   SUBJECTIVE: Ladye Woolcock is a 32 y.o.   adult accompanied by  n/a  Laurell Roof was seen in consultation at the request of Ngetich, Dinah C, NP for evaluation of developmental issues.  Types of Service: Comprehensive Clinical Assessment (CCA)  Reason for referral in patient/family's own words:  Doctor sent me, thought I was tired mentally and physically    She likes to be called [place].  She came to the appointment with  infant daughter .  Primary language at home is [place].  Constitutional Appearance: cooperative, well-nourished, well-developed, alert and well-appearing  (Patient to answer as appropriate) Gender identity: Female Sex assigned at birth: Female Pronouns: she   Mental status exam:   General Appearance Luretha Murphy:  Neat Eye Contact:  Good Motor Behavior:  Normal Speech:  Normal Level of Consciousness:  Alert Mood:  NA Affect:  Appropriate Anxiety Level:  Moderate Thought Process:  Coherent Thought Content:  WNL Perception:  Normal Judgment:  Good Insight:  Present   Current Medications and therapies: She is taking:   Outpatient Encounter Medications as of 05/18/2023  Medication Sig   diphenhydrAMINE (BENADRYL) 50 MG tablet Take 1 tablet (50 mg total) by mouth once for 1 dose. Pt to take 50 mg of benadryl on 12/07/22 at 10:10 AM. Please call [phone] with any questions. (Patient not taking: Reported on 05/03/2023)   ferrous sulfate (FERROUSUL) 325 (65 FE) MG tablet Take 1 tablet (325 mg total) by mouth every other day. (Patient not taking: Reported on 05/03/2023)   norethindrone (AYGESTIN) 5 MG tablet Take 1 tablet  (5 mg total) by mouth daily. (Patient not taking: Reported on 05/03/2023)   oxyCODONE (OXY IR/ROXICODONE) 5 MG immediate release tablet Take 1 tablet (5 mg total) by mouth every 6 (six) hours as needed for severe pain (pain score 7-10). (Patient not taking: Reported on 05/03/2023)   predniSONE (DELTASONE) 50 MG tablet Pt to take 50 mg of prednisone on 12/06/22 at 10:10 PM, 50 mg of prednisone on 12/07/22 at 4:10 AM, and 50 mg of prednisone on 12/07/22 at 10:10 AM. Pt is also to take 50 mg of benadryl on 12/07/22 at 10:10 AM. Please call [phone] with any questions. (Patient not taking: Reported on 05/03/2023)   Prenatal Vit-Fe Fumarate-FA (MULTIVITAMIN-PRENATAL) 27-0.8 MG TABS tablet Take 1 tablet by mouth daily.   SUMAtriptan (IMITREX) 50 MG tablet Take 1 tablet (50 mg total) by mouth every 2 (two) hours as needed for migraine. May repeat in 2 hours if headache persists or recurs. (Patient not taking: Reported on 05/03/2023)   terbinafine (LAMISIL AT) 1 % cream Apply 1 Application topically 2 (two) times daily. (Patient not taking: Reported on 05/03/2023)   No facility-administered encounter medications on file as of 05/18/2023.     Therapies:  None  Family history: Family mental illness:  No known history of anxiety disorder, panic disorder, social anxiety disorder, depression, suicide attempt, suicide completion, bipolar disorder, schizophrenia, eating disorder, personality disorder, OCD, PTSD, ADHD School achievement history:   Bachelor's degree Other relevant family history:  No known history of substance use or alcoholism  Social History: Now living with  husband and daughters .Marland Kitchen Employment:  Working  PT Health:   Fair, sees doctor regularly Religious or Spiritual Beliefs: N/A  Mood: She  is often anxious . No mood screens completed  Negative Mood Concerns She does not make negative statements about self. Self-injury:  No Suicidal ideation:  No Suicide attempt:  No  Additional  Anxiety Concerns: Panic attacks:  No Obsessions:  No Compulsions:  No  Stressors:  Family death and Family conflict  Alcohol and/or Substance Use: Have you recently consumed alcohol? no  Have you recently used any drugs?  no  Have you recently consumed any tobacco? no Does patient seem concerned about dependence or abuse of any substance? no  Substance Use Disorder Checklist:  N/A  Severity Risk Scoring based on DSM-5 Criteria for Substance Use Disorder. The presence of at least two (2) criteria in the last 12 months indicate a substance use disorder. The severity of the substance use disorder is defined as:  Mild: Presence of 2-3 criteria Moderate: Presence of 4-5 criteria Severe: Presence of 6 or more criteria  Traumatic Experiences: History or current traumatic events (natural disaster, house fire, etc.)? no History or current physical trauma?  no History or current emotional trauma?  no History or current sexual trauma?  no History or current domestic or intimate partner violence?  no History of bullying:  no  Risk Assessment: Suicidal or homicidal thoughts?   no Self injurious behaviors?  no Guns in the home?  no  Self Harm Risk Factors: Family or marital conflict (family conflict)  Self Harm Thoughts?: No  Patient and/or Family's Strengths/Protective Factors: Social connections, Social and Emotional competence, Concrete supports in place (healthy food, safe environments, etc.), Sense of purpose, and Physical Health (exercise, healthy diet, medication compliance, etc.)  Patient's and/or Family's Goals in their own words: To be healthy, raise my kids  Interventions: Interventions utilized:   Comprehensive Clinical Assessment (CCA)    Patient and/or Family Response: Patient agrees with treatment plan.   Standardized Assessments completed: Not Needed  Patient Centered Plan: Patient is on the following Treatment Plan(s):  IBH  DSM-5 Diagnosis: Grief;  Adjustment disorder with anxious mood  Recommendations for Services/Supports/Treatments: May benefit from ongoing therapy; continue therapeutic interventions  Progress towards Goals: Ongoing  Treatment Plan Summary: Behavioral Health Clinician will: Assess individual's status and evaluate for psychiatric symptoms and Provide coping skills enhancement  Individual will: Report any thoughts or plans of harming themselves or others and Utilize coping skills taught in therapy to reduce symptoms  Referral(s): [facility] [facility] (In Clinic)  [place], [place]

## 2023-05-05 NOTE — Progress Notes (Signed)
    Subjective:    Patient ID: Nancy Garrett is a 32 y.o. adult presenting with Post-op Follow-up  on 05/05/2023  HPI: Pt. Is s/p lap saliongectomy with HTA on 04/06/2023. Pathology reviewed and WNL  Review of Systems  Constitutional:  Negative for chills and fever.  Respiratory:  Negative for shortness of breath.   Gastrointestinal:  Negative for abdominal pain.  Genitourinary:  Negative for dysuria.  Skin:  Negative for rash.      Objective:    BP 104/71   Pulse 76  Physical Exam Vitals reviewed.  Constitutional:      General: She is not in acute distress.    Appearance: She is well-developed.  HENT:     Head: Normocephalic and atraumatic.  Eyes:     General: No scleral icterus. Neck:     Thyroid: No thyromegaly.  Cardiovascular:     Rate and Rhythm: Normal rate and regular rhythm.  Pulmonary:     Effort: Pulmonary effort is normal.  Abdominal:     Palpations: Abdomen is soft.     Tenderness: There is no abdominal tenderness.  Musculoskeletal:        General: No edema.     Cervical back: Neck supple.  Skin:    General: Skin is warm and dry.     Comments: Incisions are well healed  Neurological:     Mental Status: She is alert.  Psychiatric:        Mood and Affect: Mood and affect normal.         Assessment & Plan:   Problem List Items Addressed This Visit       Unprioritized   Menorrhagia with regular cycle    Discussed HTA and usual cycles after.       Encounter for sterilization - Primary    S/p salpingectomy. Healing well.       Return if symptoms worsen or fail to improve.  Reva Bores, MD 05/05/2023 9:24 AM

## 2023-05-13 DIAGNOSIS — R55 Syncope and collapse: Secondary | ICD-10-CM | POA: Diagnosis not present

## 2023-05-18 ENCOUNTER — Ambulatory Visit (INDEPENDENT_AMBULATORY_CARE_PROVIDER_SITE_OTHER): Payer: Medicaid Other | Admitting: Clinical

## 2023-05-18 DIAGNOSIS — F4321 Adjustment disorder with depressed mood: Secondary | ICD-10-CM

## 2023-05-18 DIAGNOSIS — F4322 Adjustment disorder with anxiety: Secondary | ICD-10-CM

## 2023-06-07 NOTE — BH Specialist Note (Signed)
 Integrated Behavioral Health via Telemedicine Visit  06/21/2023 Nancy Garrett [id number]  Number of Integrated Behavioral Health Clinician visits: Additional Visit  Session Start time: 1346   Session End time: 1420  Total time in minutes: 34   Referring Provider: Harlene Duncans, CNM Patient/Family location: Home [facility] Provider location: [facility]  All persons participating in visit: [name] and Nancy Garrett   Types of Service: Individual psychotherapy and Video visit  I connected with Nancy Garrett and/or Nancy Garrett's  n/a  via  Telephone or Video Enabled Telemedicine Application  (Video is Caregility application) and verified that I am speaking with the correct person using two identifiers. Discussed confidentiality: Yes   I discussed the limitations of telemedicine and the availability of in person appointments.  Discussed there is a possibility of technology failure and discussed alternative modes of communication if that failure occurs.  I discussed that engaging in this telemedicine visit, they consent to the provision of behavioral healthcare and the services will be billed under their insurance.  Patient and/or legal guardian expressed understanding and consented to Telemedicine visit: Yes   Presenting Concerns: Patient and/or family reports the following symptoms/concerns: Processing holidays with entire family sick, followed by the loss of two cousins (25yo by heart attack; 18yo by gunshot); pt's upcoming goal remains to maintain emotional wellness during life changes, next will be move into mother's home temporarily.  Duration of problem: Ongoing; Severity of problem: moderate  Patient and/or Family's Strengths/Protective Factors: Social connections, Concrete supports in place (healthy food, safe environments, etc.), and Sense of purpose  Goals Addressed: Patient  will:  Reduce symptoms of: anxiety and stress   Increase knowledge and/or ability of: stress reduction   Demonstrate ability to: Increase healthy adjustment to current life circumstances and Begin healthy grieving over losses  Progress towards Goals: Ongoing  Interventions: Interventions utilized:  Supportive Reflection Standardized Assessments completed: Not Needed  Patient and/or Family Response: Patient agrees with treatment plan.   Assessment: Patient currently experiencing Grief; Adjustment disorder with anxiety.   Patient may benefit from continued therapeutic intervention  .  Plan: Follow up with behavioral health clinician on : One month Behavioral recommendations:  -Continue prioritizing focus on own family's wellbeing; moving forward with plan to move by the end of January -Continue plan to attend cousin's service in [place] this coming weekend; time with extended family while there -Continue setting healthy boundaries with specific members of extended family, as discussed Referral(s): Integrated [employer] (In Clinic)  I discussed the assessment and treatment plan with the patient and/or parent/guardian. They were provided an opportunity to ask questions and all were answered. They agreed with the plan and demonstrated an understanding of the instructions.   They were advised to call back or seek an in-person evaluation if the symptoms worsen or if the condition fails to improve as anticipated.  Warren JAYSON Mering, LCSW     12/01/2022    9:42 AM 08/19/2022   11:15 AM 06/17/2022    9:27 AM 03/09/2022    9:36 AM 02/24/2022   10:22 AM  Depression screen PHQ 2/9  Decreased Interest 0 0 0 0 0  Down, Depressed, Hopeless 0 0 0 0 0  PHQ - 2 Score 0 0 0 0 0  Altered sleeping 0 0 1 3 3   Tired, decreased energy 0 2 0 2 3  Change in appetite 0 0 0 0 3  Feeling bad  or failure about yourself  0 0 0 0 0  Trouble concentrating 0 0 0 1 1  Moving slowly or  fidgety/restless 0 0 0 0 2  Suicidal thoughts 0 0 0 0 0  PHQ-9 Score 0 2 1 6 12       12/01/2022    9:40 AM 08/19/2022   11:15 AM 06/17/2022    9:27 AM 06/16/2022    9:56 AM  GAD 7 : Generalized Anxiety Score  Nervous, Anxious, on Edge 1 0 1 1  Control/stop worrying 1 0 0 2  Worry too much - different things 1 0 0 0  Trouble relaxing 0 0 1 1  Restless 0 0 0 0  Easily annoyed or irritable 1 1 1 1   Afraid - awful might happen 1 0 0 1  Total GAD 7 Score 5 1 3  6

## 2023-06-21 ENCOUNTER — Ambulatory Visit (INDEPENDENT_AMBULATORY_CARE_PROVIDER_SITE_OTHER): Payer: Medicaid Other | Admitting: Clinical

## 2023-06-21 DIAGNOSIS — F4322 Adjustment disorder with anxiety: Secondary | ICD-10-CM

## 2023-06-21 DIAGNOSIS — F4321 Adjustment disorder with depressed mood: Secondary | ICD-10-CM

## 2023-07-05 NOTE — BH Specialist Note (Signed)
Integrated Behavioral Health via Telemedicine Visit  07/19/2023 [name] [id number]  Number of Integrated Behavioral Health Clinician visits: Additional Visit  Session Start time: 1327   Session End time: 1412  Total time in minutes: 45   Referring Provider: Gerrit Heck, CNM Patient/Family location: Home [facility] Provider location: [facility]  All persons participating in visit: [name] and [facility] Murphy Duzan   Types of Service: Individual psychotherapy and Video visit  I connected with Laurell Roof and/or Dennison Mascot Totzke's  daughters  via  Telephone or Video Enabled Telemedicine Application  (Video is Caregility application) and verified that I am speaking with the correct person using two identifiers. Discussed confidentiality: Yes   I discussed the limitations of telemedicine and the availability of in person appointments.  Discussed there is a possibility of technology failure and discussed alternative modes of communication if that failure occurs.  I discussed that engaging in this telemedicine visit, they consent to the provision of behavioral healthcare and the services will be billed under their insurance.  Patient and/or legal guardian expressed understanding and consented to Telemedicine visit: Yes   Presenting Concerns: Patient and/or family reports the following symptoms/concerns: Recent "[employer] meltdown" after neither diapers nor formula needed were available, along with additional life stress (in the process of moving, buying land, baby's excessive crying incident at restaurant, negative comments from others about shopping at boycotted store, past fainting spells, etc.). Duration of problem: Ongoing; Severity of problem: moderate  Patient and/or Family's Strengths/Protective Factors: Social connections, Concrete supports in place (healthy food, safe environments, etc.),  and Sense of purpose  Goals Addressed: Patient will:  Reduce symptoms of: anxiety and stress   Increase knowledge and/or ability of: stress reduction   Demonstrate ability to: Increase healthy adjustment to current life circumstances  Progress towards Goals: Ongoing  Interventions: Interventions utilized:  Motivational Interviewing Standardized Assessments completed: Not Needed  Patient and/or Family Response: Patient agrees with treatment plan.   Assessment: Patient currently experiencing Adjustment disorder with anxiety.   Patient may benefit from continued therapeutic intervention  .  Plan: Follow up with behavioral health clinician on : One month Behavioral recommendations:  -Continue plan to be mindful of body's clues of impending fainting spell; continue working with medical provider to address this issue -Continue plan to finalize packing up and preparing for move -Continue staying in the running for chosen land to purchase -Continue making purchase decisions to benefit family's interests, regardless of other people's opinions; maintain healthy boundaries Referral(s): Integrated [employer] (In Clinic)  I discussed the assessment and treatment plan with the patient and/or parent/guardian. They were provided an opportunity to ask questions and all were answered. They agreed with the plan and demonstrated an understanding of the instructions.   They were advised to call back or seek an in-person evaluation if the symptoms worsen or if the condition fails to improve as anticipated.  [name], LCSW     12/01/2022    9:42 AM 08/19/2022   11:15 AM 06/17/2022    9:27 AM 03/09/2022    9:36 AM 02/24/2022   10:22 AM  Depression screen PHQ 2/9  Decreased Interest 0 0 0 0 0  Down, Depressed, Hopeless 0 0 0 0 0  PHQ - 2 Score 0 0 0 0 0  Altered sleeping 0 0 1 3 3   Tired, decreased energy 0 2 0 2 3  Change in appetite 0 0 0  0 3  Feeling bad or failure  about yourself  0 0 0 0 0  Trouble concentrating 0 0 0 1 1  Moving slowly or fidgety/restless 0 0 0 0 2  Suicidal thoughts 0 0 0 0 0  PHQ-9 Score 0 2 1 6 12       12/01/2022    9:40 AM 08/19/2022   11:15 AM 06/17/2022    9:27 AM 06/16/2022    9:56 AM  GAD 7 : Generalized Anxiety Score  Nervous, Anxious, on Edge 1 0 1 1  Control/stop worrying 1 0 0 2  Worry too much - different things 1 0 0 0  Trouble relaxing 0 0 1 1  Restless 0 0 0 0  Easily annoyed or irritable 1 1 1 1   Afraid - awful might happen 1 0 0 1  Total GAD 7 Score 5 1 3  6

## 2023-07-19 ENCOUNTER — Ambulatory Visit (INDEPENDENT_AMBULATORY_CARE_PROVIDER_SITE_OTHER): Payer: Medicaid Other | Admitting: Clinical

## 2023-07-19 DIAGNOSIS — F4322 Adjustment disorder with anxiety: Secondary | ICD-10-CM | POA: Diagnosis not present

## 2023-08-02 NOTE — BH Specialist Note (Signed)
 Integrated Behavioral Health via Telemedicine Visit  08/16/2023 Nancy Garrett [id number]  Number of Integrated Behavioral Health Clinician visits: Additional Visit  Session Start time: 1052   Session End time: 1135  Total time in minutes: 43   Referring Provider: Gerrit Heck, CNM Patient/Family location: Home [facility] Provider location: [facility]  All persons participating in visit: [name] and [facility] Paula Busenbark   Types of Service: Individual psychotherapy and Telephone visit  I connected with Laurell Roof and/or Dennison Mascot Robertshaw's  daughters  via  Telephone or Video Enabled Telemedicine Application  (Video is Caregility application) and verified that I am speaking with the correct person using two identifiers. Discussed confidentiality: Yes   I discussed the limitations of telemedicine and the availability of in person appointments.  Discussed there is a possibility of technology failure and discussed alternative modes of communication if that failure occurs.  I discussed that engaging in this telemedicine visit, they consent to the provision of behavioral healthcare and the services will be billed under their insurance.  Patient and/or legal guardian expressed understanding and consented to Telemedicine visit: Yes   Presenting Concerns: Patient and/or family reports the following symptoms/concerns: Concern about not hearing back from anyone after ophthalmology referral, as well as ongoing concern regarding health(teeth, skin issue, weight gain); worry about possible family conflict at upcoming summer family reunion.  Duration of problem: Ongoing; Severity of problem: moderate  Patient and/or Family's Strengths/Protective Factors: Social connections, Concrete supports in place (healthy food, safe environments, etc.), Sense of purpose, and Physical Health (exercise, healthy diet, medication  compliance, etc.)  Goals Addressed: Patient will:  Reduce symptoms of: anxiety and stress   Increase knowledge and/or ability of: stress reduction   Demonstrate ability to: Increase healthy adjustment to current life circumstances and Increase motivation to adhere to plan of care  Progress towards Goals: Ongoing  Interventions: Interventions utilized:  Motivational Interviewing and Supportive Reflection Standardized Assessments completed: Not Needed  Patient and/or Family Response: Patient agrees with treatment plan.   Assessment: Patient currently experiencing Adjustment disorder with anxiety.   Patient may benefit from continued therapeutic intervention. .  Plan: Follow up with behavioral health clinician on : Three weeks Behavioral recommendations:  -Continue daily self-coping strategies (including adding jacuzzi three times/week) -Continue plan to discuss medical concerns with medical providers (call dentist; neurology appointment; PCP appointment) -Continue working on goal on moving everything out of home as soon as able Referral(s): Integrated [employer] (In Clinic)  I discussed the assessment and treatment plan with the patient and/or parent/guardian. They were provided an opportunity to ask questions and all were answered. They agreed with the plan and demonstrated an understanding of the instructions.   They were advised to call back or seek an in-person evaluation if the symptoms worsen or if the condition fails to improve as anticipated.  [name], LCSW     12/01/2022    9:42 AM 08/19/2022   11:15 AM 06/17/2022    9:27 AM 03/09/2022    9:36 AM 02/24/2022   10:22 AM  Depression screen PHQ 2/9  Decreased Interest 0 0 0 0 0  Down, Depressed, Hopeless 0 0 0 0 0  PHQ - 2 Score 0 0 0 0 0  Altered sleeping 0 0 1 3 3   Tired, decreased energy 0 2 0 2 3  Change in appetite 0 0 0 0 3  Feeling bad or failure about yourself  0 0 0 0 0  Trouble  concentrating 0 0 0 1 1  Moving slowly or fidgety/restless 0 0 0 0 2  Suicidal thoughts 0 0 0 0 0  PHQ-9 Score 0 2 1 6 12       12/01/2022    9:40 AM 08/19/2022   11:15 AM 06/17/2022    9:27 AM 06/16/2022    9:56 AM  GAD 7 : Generalized Anxiety Score  Nervous, Anxious, on Edge 1 0 1 1  Control/stop worrying 1 0 0 2  Worry too much - different things 1 0 0 0  Trouble relaxing 0 0 1 1  Restless 0 0 0 0  Easily annoyed or irritable 1 1 1 1   Afraid - awful might happen 1 0 0 1  Total GAD 7 Score 5 1 3  6

## 2023-08-16 ENCOUNTER — Ambulatory Visit: Payer: Medicaid Other | Admitting: Clinical

## 2023-08-16 DIAGNOSIS — F4322 Adjustment disorder with anxiety: Secondary | ICD-10-CM | POA: Diagnosis not present

## 2023-09-09 ENCOUNTER — Ambulatory Visit (INDEPENDENT_AMBULATORY_CARE_PROVIDER_SITE_OTHER): Payer: PRIVATE HEALTH INSURANCE | Admitting: Family Medicine

## 2023-09-09 ENCOUNTER — Encounter (INDEPENDENT_AMBULATORY_CARE_PROVIDER_SITE_OTHER): Payer: Self-pay | Admitting: Family Medicine

## 2023-09-09 VITALS — BP 114/75 | HR 77 | Temp 99.2°F | Ht 66.0 in | Wt 262.0 lb

## 2023-09-09 DIAGNOSIS — G8929 Other chronic pain: Secondary | ICD-10-CM

## 2023-09-09 DIAGNOSIS — R739 Hyperglycemia, unspecified: Secondary | ICD-10-CM | POA: Diagnosis not present

## 2023-09-09 DIAGNOSIS — Z6841 Body Mass Index (BMI) 40.0 and over, adult: Secondary | ICD-10-CM

## 2023-09-09 DIAGNOSIS — D649 Anemia, unspecified: Secondary | ICD-10-CM

## 2023-09-09 DIAGNOSIS — M5489 Other dorsalgia: Secondary | ICD-10-CM

## 2023-09-09 DIAGNOSIS — Z903 Acquired absence of stomach [part of]: Secondary | ICD-10-CM | POA: Diagnosis not present

## 2023-09-09 DIAGNOSIS — E669 Obesity, unspecified: Secondary | ICD-10-CM

## 2023-09-09 DIAGNOSIS — D508 Other iron deficiency anemias: Secondary | ICD-10-CM

## 2023-09-09 DIAGNOSIS — Z0289 Encounter for other administrative examinations: Secondary | ICD-10-CM

## 2023-09-09 NOTE — Progress Notes (Signed)
 Office: [phone]  /  Fax: [phone]  WEIGHT SUMMARY AND BIOMETRICS  Anthropometric Measurements Height: 5\' 6"  (1.676 m) Weight: 262 lb (118.8 kg) BMI (Calculated): 42.31 Weight at Last Visit: N/A Weight Lost Since Last Visit: N/A Weight Gained Since Last Visit: N/A Starting Weight: N/A   Body Composition  Body Fat %: 49.7 % Fat Mass (lbs): 130.6 lbs Muscle Mass (lbs): 125.4 lbs Total Body Water (lbs): 90 lbs Visceral Fat Rating : 13   Other Clinical Data Fasting: No Labs: No Today's Visit #: Information Session    Chief Complaint: OBESITY    History of Present Illness   Nancy Garrett is a 33 year old female who presents for initial evaluation of her obesity.  She has a history of obesity, with a peak weight of 308 pounds. She underwent a gastric sleeve procedure in 2019 in [place], which initially resulted in a weight loss of 120 pounds, bringing her down to 187 pounds. However, she has recently experienced weight gain, reaching 269 pounds as of last week. Significant weight fluctuations have been associated with her pregnancies. After her first pregnancy in 2017, she gained over 100 pounds. Following her gastric sleeve surgery in 2019, she lost weight but has since regained weight, particularly after her second pregnancy last year, where she gained 25 pounds, reaching 250 pounds at delivery. Postpartum, she has struggled with weight gain, reaching 269 pounds recently.  She describes dietary challenges, including nausea during meals, which occurs with about two meals per day, though she manages to keep the food down. She has attempted to manage her weight through diet, including a keto diet, but has not seen significant results.  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  She has a history of  constipation, sometimes going two to three months without a bowel movement, which has required hospitalization in the past. There is a family history of colon cancer, which has been a concern for her.  She also reports episodes of passing out, which began during her last pregnancy, characterized by feeling hot, developing a headache, and then losing consciousness for 5 to 10 seconds. 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 She has not been eating these substances for the past two to three months.          PHYSICAL EXAM:  Blood pressure 114/75, pulse 77, temperature 99.2 F (37.3 C), height 5\' 6"  (1.676 m), weight 262 lb (118.8 kg), SpO2 100%, currently breastfeeding. Body mass index is 42.29 kg/m.  DIAGNOSTIC DATA REVIEWED:  BMET    Component Value Date/Time   NA 139 03/29/2023 1500   NA 137 06/17/2022 0852   K 3.8 03/29/2023 1500   CL 107 03/29/2023 1500   CO2 25 03/29/2023 1500   GLUCOSE 102 (H) 03/29/2023 1500   BUN 13 03/29/2023 1500   BUN 12 06/17/2022 0852   CREATININE 0.66 03/29/2023 1500   CREATININE 0.61 09/22/2021 1110   CALCIUM 9.1 03/29/2023 1500   GFRNONAA >60 03/29/2023 1500   GFRAA >60 11/08/2014 0600   Lab Results  Component Value Date   HGBA1C 5.6  02/24/2022   HGBA1C 5.6 07/27/2013   No results found for: "INSULIN" Lab Results  Component Value Date   TSH 0.864 01/18/2023   CBC    Component Value Date/Time   WBC 10.3 03/29/2023 1500   RBC 4.16 03/29/2023 1500   HGB 10.6 (L) 03/29/2023 1500   HGB 11.3 01/18/2023 1120   HCT 34.6 (L) 03/29/2023 1500   HCT 36.0 01/18/2023 1120   PLT 295 03/29/2023 1500   PLT 260 01/18/2023 1120   MCV 83.2 03/29/2023 1500   MCV 83 01/18/2023 1120   MCH 25.5 (L)  03/29/2023 1500   MCHC 30.6 03/29/2023 1500   RDW 16.5 (H) 03/29/2023 1500   RDW 14.7 01/18/2023 1120   Iron Studies    Component Value Date/Time   FERRITIN 11 (L) 07/31/2022 1048   Lipid Panel     Component Value Date/Time   CHOL 255 (H) 09/22/2021 1110   TRIG 49 09/22/2021 1110   HDL 70 09/22/2021 1110   CHOLHDL 3.6 09/22/2021 1110   VLDL 16 07/27/2013 1810   LDLCALC 170 (H) 09/22/2021 1110   Hepatic Function Panel     Component Value Date/Time   PROT 6.1 (L) 09/15/2022 1617   PROT 6.9 06/17/2022 0852   ALBUMIN 2.4 (L) 09/15/2022 1617   ALBUMIN 4.0 06/17/2022 0852   AST 17 09/15/2022 1617   ALT 12 09/15/2022 1617   ALKPHOS 172 (H) 09/15/2022 1617   BILITOT 0.7 09/15/2022 1617   BILITOT 0.3 06/17/2022 0852      Component Value Date/Time   TSH 0.864 01/18/2023 1120   Nutritional No results found for: "VD25OH"   Assessment and Plan    Obesity She has obesity with a peak weight of 308 lbs. After a gastric sleeve procedure in 2019 in [place], she lost 120 lbs, reaching 187 lbs, but has regained weight, currently at 269 lbs. She reports nausea with meals and back pain exacerbated by physical activity. She is interested in weight management options, including potential revision surgery and medication. The multifactorial nature of obesity was discussed, emphasizing the importance of a comprehensive workup to identify mechanisms contributing to weight gain and resistance to weight loss. The practice offers metabolism testing, blood work, customized eating plans, and regular follow-ups. Revisional surgery is an option if desired, with ongoing management post-surgery, including medications if necessary. - Schedule a comprehensive workup visit for obesity management. - Perform metabolism testing and blood work, including anemia assessment. - Develop a customized eating plan based on workup results. - Schedule follow-up visits every two weeks for the first three months. - Discuss  potential weight loss medications if needed. - Provide guidance on revisional surgery options if desired.  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 She has been seeing a specialist for this issue, and aquatic therapy has provided some relief. The pain impacts her ability to perform daily activities, including lifting her children. - Continue with specialist care for back pain management. - Will factor this pain when exercise plans are developed  Anemia She has anemia, particularly during pregnancy, and reports cravings for non-nutritive substances, indicating possible ongoing iron deficiency. Anemia can contribute to decreased metabolism and fatigue. - Include iron studies in the blood work to assess for anemia. - Address any identified iron deficiency to improve symptoms and metabolism.  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 She is under the care of  a neurologist but has not yet been contacted for further imaging. The symptoms may be related to the tumor or other neurological issues. - Encourage her to contact the neurologist's office to follow up on the referral and imaging for the brain tumor. - Will factor this in when considering weight loss medications     Hyperglycemia She has a history of gestational DM and has some elevated glucose readings in Epic - Will plan to check labs and start an eating plan appropriate for this condition    I have personally spent 45 minutes total time today in preparation, patient care, and documentation for this visit, including the following: review of clinical lab tests; review of medical tests/procedures/services.    She was informed of the importance of frequent follow up visits to maximize her success with intensive lifestyle modifications for her multiple health conditions.    Quillian Quince, MD

## 2023-09-17 NOTE — BH Specialist Note (Signed)
 Integrated Behavioral Health via Telemedicine Visit  09/27/2023 Nancy Garrett [id number]  Number of Integrated Behavioral Health Clinician visits: Additional Visit  Session Start time: 1317   Session End time: 1400  Total time in minutes: 43   Referring Provider: Loetta Ringer, CNM Patient/Family location: Home [facility] Provider location: [facility]  All persons participating in visit: [name] and [facility] Tarick Parenteau   Types of Service: Individual psychotherapy and Video visit  I connected with Lorina Faye Martian and/or Shalamar Faye Wilczynski's  n/a  via  Telephone or Video Enabled Telemedicine Application  (Video is Caregility application) and verified that I am speaking with the correct person using two identifiers. Discussed confidentiality: Yes   I discussed the limitations of telemedicine and the availability of in person appointments.  Discussed there is a possibility of technology failure and discussed alternative modes of communication if that failure occurs.  I discussed that engaging in this telemedicine visit, they consent to the provision of behavioral healthcare and the services will be billed under their insurance.  Patient and/or legal guardian expressed understanding and consented to Telemedicine visit: Yes   Presenting Concerns: Patient and/or family reports the following symptoms/concerns: Processing stressful recent experience at ED with 1yo daughter (stayed four days, no known cause for seizure-like shaking and listlessness, etc.), along with pt's own episodes of passing out when overheated at daughter's birthday party. Pt is keeping a hopeful, optimistic outlook to cope.  Duration of problem: Ongoing anxiety, with increase during recent stressful events; Severity of problem: moderate  Patient and/or Family's Strengths/Protective Factors: Social connections, Concrete supports in place  (healthy food, safe environments, etc.), Sense of purpose, and Physical Health (exercise, healthy diet, medication compliance, etc.)  Goals Addressed: Patient will:  Reduce symptoms of: anxiety and stress    Demonstrate ability to: Increase healthy adjustment to current life circumstances  Progress towards Goals: Ongoing  Interventions: Interventions utilized:  Supportive Reflection Standardized Assessments completed: Not Needed  Patient and/or Family Response: Patient agrees with treatment plan.   Assessment: Patient currently experiencing Adjustment disorder with anxious mood.   Patient may benefit from continued therapeutic intervention. .  Plan: Follow up with behavioral health clinician on : One month Behavioral recommendations:  -Continue daily self-coping strategies as needed -Continue plan to attend all upcoming medical appointments (neurology, weight loss clinic, etc.), as well as daughter's upcoming neurology appointment Referral(s): Integrated [employer] (In Clinic)  I discussed the assessment and treatment plan with the patient and/or parent/guardian. They were provided an opportunity to ask questions and all were answered. They agreed with the plan and demonstrated an understanding of the instructions.   They were advised to call back or seek an in-person evaluation if the symptoms worsen or if the condition fails to improve as anticipated.  Georgia Kipper, LCSW     12/01/2022    9:42 AM 08/19/2022   11:15 AM 06/17/2022    9:27 AM 03/09/2022    9:36 AM 02/24/2022   10:22 AM  Depression screen PHQ 2/9  Decreased Interest 0 0 0 0 0  Down, Depressed, Hopeless 0 0 0 0 0  PHQ - 2 Score 0 0 0 0 0  Altered sleeping 0 0 1 3 3   Tired, decreased energy 0 2 0 2 3  Change in appetite 0 0 0 0 3  Feeling bad or failure about yourself  0 0 0 0 0  Trouble concentrating 0 0 0  1 1  Moving slowly or fidgety/restless 0 0 0 0 2  Suicidal thoughts 0 0 0 0 0   PHQ-9 Score 0 2 1 6 12       12/01/2022    9:40 AM 08/19/2022   11:15 AM 06/17/2022    9:27 AM 06/16/2022    9:56 AM  GAD 7 : Generalized Anxiety Score  Nervous, Anxious, on Edge 1 0 1 1  Control/stop worrying 1 0 0 2  Worry too much - different things 1 0 0 0  Trouble relaxing 0 0 1 1  Restless 0 0 0 0  Easily annoyed or irritable 1 1 1 1   Afraid - awful might happen 1 0 0 1  Total GAD 7 Score 5 1 3  6

## 2023-09-20 ENCOUNTER — Encounter

## 2023-09-27 ENCOUNTER — Ambulatory Visit: Admitting: Clinical

## 2023-09-27 DIAGNOSIS — F4322 Adjustment disorder with anxiety: Secondary | ICD-10-CM | POA: Diagnosis not present

## 2023-10-13 ENCOUNTER — Ambulatory Visit (INDEPENDENT_AMBULATORY_CARE_PROVIDER_SITE_OTHER): Payer: PRIVATE HEALTH INSURANCE | Admitting: Family Medicine

## 2023-10-13 ENCOUNTER — Encounter (INDEPENDENT_AMBULATORY_CARE_PROVIDER_SITE_OTHER): Payer: Self-pay | Admitting: Family Medicine

## 2023-10-13 VITALS — BP 96/63 | HR 85 | Temp 98.3°F | Ht 67.0 in | Wt 268.0 lb

## 2023-10-13 DIAGNOSIS — R0602 Shortness of breath: Secondary | ICD-10-CM

## 2023-10-13 DIAGNOSIS — R5383 Other fatigue: Secondary | ICD-10-CM | POA: Diagnosis not present

## 2023-10-13 DIAGNOSIS — E669 Obesity, unspecified: Secondary | ICD-10-CM

## 2023-10-13 DIAGNOSIS — Z1331 Encounter for screening for depression: Secondary | ICD-10-CM | POA: Diagnosis not present

## 2023-10-13 DIAGNOSIS — Z6841 Body Mass Index (BMI) 40.0 and over, adult: Secondary | ICD-10-CM

## 2023-10-13 DIAGNOSIS — E785 Hyperlipidemia, unspecified: Secondary | ICD-10-CM | POA: Insufficient documentation

## 2023-10-13 DIAGNOSIS — Z903 Acquired absence of stomach [part of]: Secondary | ICD-10-CM

## 2023-10-13 DIAGNOSIS — E7849 Other hyperlipidemia: Secondary | ICD-10-CM

## 2023-10-13 DIAGNOSIS — D508 Other iron deficiency anemias: Secondary | ICD-10-CM

## 2023-10-13 DIAGNOSIS — Z8632 Personal history of gestational diabetes: Secondary | ICD-10-CM

## 2023-10-13 DIAGNOSIS — Z8759 Personal history of other complications of pregnancy, childbirth and the puerperium: Secondary | ICD-10-CM

## 2023-10-13 NOTE — Assessment & Plan Note (Signed)
 Patient had term pre eclampsia with first pregnancy then had gastric sleeve and did not have pre eclampsia with second pregnancy.  Already sees cardiology.  Will defer follow up treatment to Dr. Emmette Harms.

## 2023-10-13 NOTE — Assessment & Plan Note (Signed)
 In first pregnancy and needed insulin.  Follow up A1c was within normal limits. No on medications currently.

## 2023-10-13 NOTE — Progress Notes (Signed)
 Chief Complaint:  Obesity   Subjective:  Nancy Garrett (MR# [id number]) is a 33 y.o. adult who presents for evaluation and treatment of obesity and related comorbidities.   Nancy Garrett is currently in the action stage of change and ready to dedicate time achieving and maintaining a healthier weight. Nancy Garrett is interested in becoming our patient and working on intensive lifestyle modifications including (but not limited to) diet and exercise for weight loss.  She works as a Associate Professor and lives at home with her husband and two kids- Nancy Garrett (husband), Nancy Garrett (daughter, 7), Nancy Garrett (daughter, 1).  She is not breastfeeding anymore. She had a gastric sleeve in 2019 but doesn't feel like she has much restriction anymore. Feels like she can eat a full amount about 5x a week.  Biggest issue is eating fast due to her work schedule.  Family is supportive of her, they eat meals together and they will be eating the same foods. Desired weight is 150lbs and last time she was that weight was in high school.    Nancy Garrett has been struggling with her weight. She has been unsuccessful in either losing weight, maintaining weight loss, or reaching her healthy weight goal.  Patient underwent gastric sleeve in March 2019 in [place].  Her starting weight was 308 pounds and her lowest weight after surgery was 187 pounds.  It took 1 year to get to her lowest weight.  She started regaining 1 year postop.  She is not sure why.  She states she eats outside the home 2 times a week with fast food or takeout being 1 time a week.  She does a majority of the grocery shopping once a week and she does use a grocery list.  She likes to cook and has no obstacles to cooking.  She does crave barbecue type foods.  Her food dislikes her avocado and mayonnaise.  She tends to snack on anything but does not have specific times of the day in which she snacks.  She does not skip meals.  Her biggest food obstacles are excessive hunger but she voices she does  not have a problem with portion control or poor food choices.  Food Recall: Eggs in the am and grits- spoonful of grits, 2 eggs with onions and bell peppers with lemonade.  Felt full.  Lunch was leftovers from night before- chick fil a chicken minis, bread off 3 and applesauce, lemonade.  Felt full and satisfied. Dinner broccoli slaw sauteed (2 cups), 4 chicken wings, side basamati rice (1/3 cup).  Felt full.  1/2 cinnamon twist from [employer].  Indirect Calorimeter completed today shows a RMR: 2131. Her calculated basal metabolic rate is 7829 thus her basal metabolic rate is better than expected.  Other Fatigue Nancy Garrett admits to daytime somnolence and admits to waking up still tired. Patient has a history of symptoms of daytime fatigue and morning fatigue. Nancy Garrett generally gets 4-6 hours of sleep per night, and states that she has generally restless sleep. Snoring is present. Apneic episodes are not present. Epworth Sleepiness Score is 6.   Shortness of Breath Stuti notes increasing shortness of breath with exercising and seems to be worsening over time with weight gain. She notes getting out of breath sooner with activity than she used to. This has not gotten worse recently. [name] denies shortness of breath at rest or orthopnea.  Depression Screen Nancy Garrett's Food and Mood (modified PHQ-9) score was 7.     10/13/2023    7:37 AM  Depression screen  PHQ 2/9  Decreased Interest 0  Down, Depressed, Hopeless 0  PHQ - 2 Score 0  Altered sleeping 1  Tired, decreased energy 1  Change in appetite 0  Feeling bad or failure about yourself  0  Trouble concentrating 0  Moving slowly or fidgety/restless 0  PHQ-9 Score 2     Objective:  Vitals Temp: 98.3 F (36.8 C) BP: 96/63 Pulse Rate: 85 SpO2: 100 %   Anthropometric Measurements Height: 5\' 7"  (1.702 m) Weight: 268 lb (121.6 kg) BMI (Calculated): 41.96 Starting Weight: 268 lb Peak Weight: 308 lb Waist Measurement : 43  inches   Body Composition  Body Fat %: 50.3 % Fat Mass (lbs): 135.2 lbs Muscle Mass (lbs): 126.6 lbs Total Body Water (lbs): 95.4 lbs Visceral Fat Rating : 13   Other Clinical Data RMR: 2131 Fasting: yes Labs: yes Today's Visit #: 1 Starting Date: 10/13/23    EKG: Normal sinus rhythm, rate 75 done on 03/24/23 with Dr. Emmette Harms  General: Cooperative, alert, well developed, in no acute distress. HEENT: Conjunctivae and lids unremarkable. Cardiovascular: Regular rhythm.  Lungs: Normal work of breathing. Neurologic: No focal deficits.   Lab Results  Component Value Date   CREATININE 0.66 03/29/2023   BUN 13 03/29/2023   NA 139 03/29/2023   K 3.8 03/29/2023   CL 107 03/29/2023   CO2 25 03/29/2023   Lab Results  Component Value Date   ALT 12 09/15/2022   AST 17 09/15/2022   ALKPHOS 172 (H) 09/15/2022   BILITOT 0.7 09/15/2022   Lab Results  Component Value Date   HGBA1C 5.6 02/24/2022   HGBA1C 5.6 07/27/2013   No results found for: "INSULIN" Lab Results  Component Value Date   TSH 0.864 01/18/2023   Lab Results  Component Value Date   CHOL 255 (H) 09/22/2021   HDL 70 09/22/2021   LDLCALC 170 (H) 09/22/2021   TRIG 49 09/22/2021   CHOLHDL 3.6 09/22/2021   Lab Results  Component Value Date   WBC 10.3 03/29/2023   HGB 10.6 (L) 03/29/2023   HCT 34.6 (L) 03/29/2023   MCV 83.2 03/29/2023   PLT 295 03/29/2023   Lab Results  Component Value Date   FERRITIN 11 (L) 07/31/2022    Assessment and Plan:   Other Fatigue  Wilder does feel that her weight is causing her energy to be lower than it should be. Fatigue may be related to obesity, depression or many other causes. Labs will be ordered, and in the meanwhile, [name] will focus on self care including making healthy food choices, increasing physical activity and focusing on stress reduction.  Shortness of Breath  Nancy Garrett does feel that she gets out of breath more easily that she used to when she exercises.  Nancy Garrett's shortness of breath appears to be obesity related and exercise induced. She has agreed to work on weight loss and gradually increase exercise to treat her exercise induced shortness of breath. Will continue to monitor closely.   Problem List Items Addressed This Visit       Other   H/O gastric sleeve   Weight from 308 to 187.  Not much restriction left.  Needs anemia panel and prealbumin today.      History of gestational diabetes   In first pregnancy and needed insulin.  Follow up A1c was within normal limits. No on medications currently.      Relevant Orders   Comprehensive metabolic panel with GFR   Hemoglobin A1c   Insulin,  random   History of pre-eclampsia   Patient had term pre eclampsia with first pregnancy then had gastric sleeve and did not have pre eclampsia with second pregnancy.  Already sees cardiology.  Will defer follow up treatment to Dr. Emmette Harms.      Hyperlipidemia   Last LDL 170 upon review of labs in Epic.  Not on medication.  Will repeat FLP today.      Relevant Orders   Lipid Panel With LDL/HDL Ratio   Other Visit Diagnoses       Other fatigue    -  Primary   Relevant Orders   T4, free   T3   VITAMIN D 25 Hydroxy (Vit-D Deficiency, Fractures)   TSH     SOBOE (shortness of breath on exertion)         Depression screening         S/P gastric sleeve procedure       Relevant Orders   Folate   Anemia panel   Prealbumin     Other iron deficiency anemia       Relevant Orders   Vitamin B12   CBC with Differential/Platelet   Anemia panel     Obesity, Beginning BMI 42.4         BMI 40.0-44.9, adult (HCC)           Lumen is currently in the action stage of change and her goal is to continue with weight loss efforts. I recommend Clydine begin the structured treatment plan as follows:  She has agreed to Category 3 Plan  Exercise goals: All adults should avoid inactivity. Some activity is better than none, and adults who participate in any  amount of physical activity, gain some health benefits.  Behavioral modification strategies:increasing lean protein intake, decreasing simple carbohydrates, no skipping meals, meal planning and cooking strategies, and keeping healthy foods in the home  She was informed of the importance of frequent follow-up visits to maximize her success with intensive lifestyle modifications for her multiple health conditions. She was informed we would discuss her lab results at her next visit unless there is a critical issue that needs to be addressed sooner. Alyssia agreed to keep her next visit at the agreed upon time to discuss these results.  Labs ordered with plans to discuss at the next visit.   Attestation Statements:  Reviewed by clinician on day of visit: allergies, medications, problem list, medical history, surgical history, family history, social history, and previous encounter notes.  This is the patient's first visit at [facility]. The patient's NEW PATIENT PACKET was reviewed at length. Included in the packet: current and past health history, medications, allergies, ROS, gynecologic history (women only), surgical history, family history, social history, weight history, weight loss surgery history (for those that have had weight loss surgery), nutritional evaluation, mood and food questionnaire, PHQ9, Epworth questionnaire, sleep habits questionnaire, patient life and health improvement goals questionnaire. These will all be scanned into the patient's chart under media.   During the visit, I independently reviewed the patient's EKG, bioimpedance scale results, and indirect calorimeter results. I used this information to tailor a meal plan for the patient that will help her to lose weight and will improve her obesity-related conditions going forward. I performed a medically necessary appropriate examination and/or evaluation. I discussed the assessment and treatment plan with the  patient. The patient was provided an opportunity to ask questions and all were answered. The patient agreed with the plan and  demonstrated an understanding of the instructions. Labs were ordered at this visit and will be reviewed at the next visit unless more critical results need to be addressed immediately. Clinical information was updated and documented in the EMR.     Donaciano Frizzle, MD

## 2023-10-13 NOTE — Assessment & Plan Note (Signed)
 Last LDL 170 upon review of labs in Epic.  Not on medication.  Will repeat FLP today.

## 2023-10-13 NOTE — Assessment & Plan Note (Signed)
 Weight from 308 to 187.  Not much restriction left.  Needs anemia panel and prealbumin today.

## 2023-10-14 LAB — CBC WITH DIFFERENTIAL/PLATELET
Basophils Absolute: 0 10*3/uL (ref 0.0–0.2)
Basos: 0 %
EOS (ABSOLUTE): 0.1 10*3/uL (ref 0.0–0.4)
Eos: 1 %
Hemoglobin: 11.1 g/dL (ref 11.1–15.9)
Immature Grans (Abs): 0 10*3/uL (ref 0.0–0.1)
Immature Granulocytes: 0 %
Lymphocytes Absolute: 2.9 10*3/uL (ref 0.7–3.1)
Lymphs: 32 %
MCH: 25 pg — ABNORMAL LOW (ref 26.6–33.0)
MCHC: 30.1 g/dL — ABNORMAL LOW (ref 31.5–35.7)
MCV: 83 fL (ref 79–97)
Monocytes Absolute: 0.4 10*3/uL (ref 0.1–0.9)
Monocytes: 5 %
Neutrophils Absolute: 5.5 10*3/uL (ref 1.4–7.0)
Neutrophils: 62 %
Platelets: 299 10*3/uL (ref 150–450)
RBC: 4.44 x10E6/uL (ref 3.77–5.28)
RDW: 17.6 % — ABNORMAL HIGH (ref 11.7–15.4)
WBC: 8.9 10*3/uL (ref 3.4–10.8)

## 2023-10-14 LAB — LIPID PANEL WITH LDL/HDL RATIO
Cholesterol, Total: 241 mg/dL — ABNORMAL HIGH (ref 100–199)
HDL: 61 mg/dL (ref >39)
LDL Chol Calc (NIH): 168 mg/dL — ABNORMAL HIGH (ref 0–99)
LDL/HDL Ratio: 2.8 ratio (ref 0.0–3.2)
Triglycerides: 71 mg/dL (ref 0–149)
VLDL Cholesterol Cal: 12 mg/dL (ref 5–40)

## 2023-10-14 LAB — COMPREHENSIVE METABOLIC PANEL WITH GFR
ALT: 6 IU/L (ref 0–32)
AST: 11 IU/L (ref 0–40)
Albumin: 4.3 g/dL (ref 3.9–4.9)
Alkaline Phosphatase: 127 IU/L — ABNORMAL HIGH (ref 44–121)
BUN/Creatinine Ratio: 24 — ABNORMAL HIGH (ref 9–23)
BUN: 14 mg/dL (ref 6–20)
Bilirubin Total: 0.3 mg/dL (ref 0.0–1.2)
CO2: 22 mmol/L (ref 20–29)
Calcium: 9.2 mg/dL (ref 8.7–10.2)
Chloride: 103 mmol/L (ref 96–106)
Creatinine, Ser: 0.59 mg/dL (ref 0.57–1.00)
Globulin, Total: 2.6 g/dL (ref 1.5–4.5)
Glucose: 86 mg/dL (ref 70–99)
Potassium: 4.2 mmol/L (ref 3.5–5.2)
Sodium: 139 mmol/L (ref 134–144)
Total Protein: 6.9 g/dL (ref 6.0–8.5)
eGFR: 122 mL/min/{1.73_m2} (ref >59)

## 2023-10-14 LAB — HEMOGLOBIN A1C
Est. average glucose Bld gHb Est-mCnc: 128 mg/dL
Hgb A1c MFr Bld: 6.1 % — ABNORMAL HIGH (ref 4.8–5.6)

## 2023-10-14 LAB — VITAMIN D 25 HYDROXY (VIT D DEFICIENCY, FRACTURES): Vit D, 25-Hydroxy: 15.2 ng/mL — ABNORMAL LOW (ref 30.0–100.0)

## 2023-10-14 LAB — INSULIN, RANDOM: INSULIN: 10.4 u[IU]/mL (ref 2.6–24.9)

## 2023-10-14 LAB — ANEMIA PANEL
Ferritin: 12 ng/mL — ABNORMAL LOW (ref 15–150)
Folate, Hemolysate: 252 ng/mL
Folate, RBC: 683 ng/mL (ref >498)
Hematocrit: 36.9 % (ref 34.0–46.6)
Iron Saturation: 7 % — CL (ref 15–55)
Iron: 25 ug/dL — ABNORMAL LOW (ref 27–159)
Retic Ct Pct: 0.9 % (ref 0.6–2.6)
Total Iron Binding Capacity: 343 ug/dL (ref 250–450)
UIBC: 318 ug/dL (ref 131–425)
Vitamin B-12: 288 pg/mL (ref 232–1245)

## 2023-10-14 LAB — T3: T3, Total: 97 ng/dL (ref 71–180)

## 2023-10-14 LAB — PREALBUMIN: PREALBUMIN: 19 mg/dL (ref 14–35)

## 2023-10-14 LAB — FOLATE: Folate: 5 ng/mL (ref >3.0)

## 2023-10-14 LAB — T4, FREE: Free T4: 1.12 ng/dL (ref 0.82–1.77)

## 2023-10-14 LAB — TSH: TSH: 1.22 u[IU]/mL (ref 0.450–4.500)

## 2023-10-15 NOTE — BH Specialist Note (Unsigned)
 Integrated Behavioral Health via Telemedicine Visit  10/15/2023 Nancy Garrett [id number]  Number of Integrated Behavioral Health Clinician visits: Additional Visit  Session Start time: 1317   Session End time: 1400  Total time in minutes: 43   Referring Provider: Loetta Ringer, CNM Patient/Family location: Home*** [facility] Provider location: [facility]  All persons participating in visit: [name] and [facility] [name] ***  Types of Service: {CHL AMB TYPE OF SERVICE:[phone]}  I connected with Nancy Garrett and/or Nancy Garrett's {family members:20773} via  Telephone or Video Enabled Telemedicine Application  (Video is Caregility application) and verified that I am speaking with the correct person using two identifiers. Discussed confidentiality: Yes   I discussed the limitations of telemedicine and the availability of in person appointments.  Discussed there is a possibility of technology failure and discussed alternative modes of communication if that failure occurs.  I discussed that engaging in this telemedicine visit, they consent to the provision of behavioral healthcare and the services will be billed under their insurance.  Patient and/or legal guardian expressed understanding and consented to Telemedicine visit: Yes   Presenting Concerns: Patient and/or family reports the following symptoms/concerns: *** Duration of problem: ***; Severity of problem: {Mild/Moderate/Severe:20260}  Patient and/or Family's Strengths/Protective Factors: {CHL AMB BH PROTECTIVE FACTORS:6281228047}  Goals Addressed: Patient will:  Reduce symptoms of: {IBH Symptoms:21014056}   Increase knowledge and/or ability of: {IBH Patient Tools:21014057}   Demonstrate ability to: {IBH Goals:21014053}  Progress towards Goals: {CHL AMB BH PROGRESS TOWARDS GOALS:[id number]}  Interventions: Interventions  utilized:  {IBH Interventions:21014054} Standardized Assessments completed: {IBH Screening Tools:21014051}  Patient and/or Family Response: Patient agrees with treatment plan. ***  Assessment: Patient currently experiencing ***.   Patient may benefit from continued therapeutic intervention *** .  Plan: Follow up with behavioral health clinician on : *** Behavioral recommendations:  -*** -*** Referral(s): {IBH Referrals:21014055}  I discussed the assessment and treatment plan with the patient and/or parent/guardian. They were provided an opportunity to ask questions and all were answered. They agreed with the plan and demonstrated an understanding of the instructions.   They were advised to call back or seek an in-person evaluation if the symptoms worsen or if the condition fails to improve as anticipated.  Georgia Kipper, LCSW     10/13/2023    7:37 AM 12/01/2022    9:42 AM 08/19/2022   11:15 AM 06/17/2022    9:27 AM 03/09/2022    9:36 AM  Depression screen PHQ 2/9  Decreased Interest 0 0 0 0 0  Down, Depressed, Hopeless 0 0 0 0 0  PHQ - 2 Score 0 0 0 0 0  Altered sleeping 1 0 0 1 3  Tired, decreased energy 1 0 2 0 2  Change in appetite 0 0 0 0 0  Feeling bad or failure about yourself  0 0 0 0 0  Trouble concentrating 0 0 0 0 1  Moving slowly or fidgety/restless 0 0 0 0 0  Suicidal thoughts  0 0 0 0  PHQ-9 Score 2 0 2 1 6       12/01/2022    9:40 AM 08/19/2022   11:15 AM 06/17/2022    9:27 AM 06/16/2022    9:56 AM  GAD 7 : Generalized Anxiety Score  Nervous, Anxious, on Edge 1 0 1 1  Control/stop worrying 1 0 0 2  Worry too much - different things 1 0 0 0  Trouble  relaxing 0 0 1 1  Restless 0 0 0 0  Easily annoyed or irritable 1 1 1 1   Afraid - awful might happen 1 0 0 1  Total GAD 7 Score 5 1 3  6

## 2023-10-21 NOTE — Progress Notes (Deleted)
 No chief complaint on file.     ASSESSMENT AND PLAN  Nancy Garrett is a 33 y.o. adult    Chronic migraines Frequent headaches Obesity  Risk for intracranial hypertension, refer to ophthalmologist for further evaluation  Sumatriptan  50 mg as needed may discard her breast milk 24 hours after use  Once she stopped breast-feeding, may consider daily preventive medication       DIAGNOSTIC DATA (LABS, IMAGING, TESTING) - I reviewed patient records, labs, notes, testing and imaging myself where available.   MEDICAL HISTORY:  Update 10/25/2023 JM: Patient returns for follow-up visit.         Consult visit 04/15/2023 Dr. Gracie Lav: Nancy Garrett, is a 33 year old female seen in request by her primary care nurse practitioner Ngetich, Dinah for evaluation of migraine headache initial evaluation April 15, 2023   History is obtained from the patient and review of electronic medical records. I personally reviewed pertinent available imaging films in PACS.   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,   She had long history of chronic migraine, increased migraine headache during her first pregnancy in 2017, could not function well, to the point have bed resting, her headache improved postpartum  Began to have frequent headaches again during her recent pregnancy in November 2023, has been persistent despite delivery in April 2024  3 to 4 days out of the week she would have moderate headaches, with weather changes in September 2024 she began to have daily pressure headaches, multiple times a day, pressure, bilateral frontal headache, with some light noise sensitivity, tried over-the-counter medication with limited help  She denies significant visual change, had a gastric bypass surgery in the past, with significant weight loss with some weight gain again with her second pregnancy,      PHYSICAL EXAM:   There were no vitals filed for this visit.  Not  recorded     There is no height or weight on file to calculate BMI.  PHYSICAL EXAMNIATION:  Gen: NAD, conversant, well nourised, well groomed                     Cardiovascular: Regular rate rhythm, no peripheral edema, warm, nontender. Eyes: Conjunctivae clear without exudates or hemorrhage Neck: Supple, no carotid bruits. Pulmonary: Clear to auscultation bilaterally   NEUROLOGICAL EXAM:  MENTAL STATUS: Speech/cognition: Awake, alert, oriented to history taking and casual conversation CRANIAL NERVES: CN II: Visual fields are full to confrontation. Pupils are round equal and briskly reactive to light.  I was able to take a glimpse of her optic disc, seems to have blurry edge CN III, IV, VI: extraocular movement are normal. No ptosis. CN V: Facial sensation is intact to light touch CN VII: Face is symmetric with normal eye closure  CN VIII: Hearing is normal to causal conversation. CN IX, X: Phonation is normal. CN XI: Head turning and shoulder shrug are intact  MOTOR: There is no pronator drift of out-stretched arms. Muscle bulk and tone are normal. Muscle strength is normal.  REFLEXES: Reflexes are 2+ and symmetric at the biceps, triceps, knees, and ankles. Plantar responses are flexor.  SENSORY: Intact to light touch, pinprick and vibratory sensation are intact in fingers and toes.  COORDINATION: There is no trunk or limb dysmetria noted.  GAIT/STANCE: Posture is normal. Gait is steady with normal steps, base, arm swing, and turning. Heel and toe walking are normal. Tandem gait is normal.  Romberg is absent.  REVIEW  OF SYSTEMS:  Full 14 system review of systems performed and notable only for as above All other review of systems were negative.   ALLERGIES: Allergies  Allergen Reactions   Gadolinium Hives, Itching and Nausea And Vomiting    Pt reports she has allergy to MRI contrast not CT contrast. AV, RN 11/24/22   Iodinated Contrast Media    Ivp Dye [Iodinated  Contrast Media] Nausea And Vomiting    HOME MEDICATIONS: No current outpatient medications on file.   No current facility-administered medications for this visit.    PAST MEDICAL HISTORY: Past Medical History:  Diagnosis Date   Anemia    Hx   Anxiety    Back pain    Chronic abdominal pain    Constipation    Head ache    Hip pain    Prediabetes    Syncope    cardiology and neurology evaluations ~ 03/2022    PAST SURGICAL HISTORY: Past Surgical History:  Procedure Laterality Date   DILITATION & CURRETTAGE/HYSTROSCOPY WITH HYDROTHERMAL ABLATION N/A 04/06/2023   Procedure: DILATATION & CURETTAGE/HYSTEROSCOPY WITH HYDROTHERMAL ABLATION;  Surgeon: Granville Layer, MD;  Location: [facility] OR;  Service: Gynecology;  Laterality: N/A;   LAPAROSCOPIC BILATERAL SALPINGECTOMY Bilateral 04/06/2023   Procedure: LAPAROSCOPIC BILATERAL SALPINGECTOMY;  Surgeon: Granville Layer, MD;  Location: [facility] OR;  Service: Gynecology;  Laterality: Bilateral;   LAPAROSCOPIC GASTRIC SLEEVE RESECTION  08/24/2017   Dr. Dorma Gash. Done in [place]   TONSILLECTOMY     As a child   TUBAL LIGATION  03/2023    FAMILY HISTORY: Family History  Problem Relation Age of Onset   Hypertension Mother    High blood pressure Mother    Diabetes Father    Diabetes Sister    Stroke Sister    High blood pressure Sister     SOCIAL HISTORY: Social History   Socioeconomic History   Marital status: Married    Spouse name: Not on file   Number of children: Not on file   Years of education: Not on file   Highest education level: Not on file  Occupational History   Occupation: Cosmetologist  Tobacco Use   Smoking status: Never   Smokeless tobacco: Never  Vaping Use   Vaping status: Never Used  Substance and Sexual Activity   Alcohol use: Not Currently    Comment: Occasionally   Drug use: Never   Sexual activity: Yes  Other Topics Concern   Not on file  Social History Narrative   ** Merged History Encounter  **       Tobacco use, amount per day now: Past tobacco use, amount per day: How many years did you use tobacco: Alcohol use (drinks per week): Diet: Do you drink/eat things with caffeine: No Marital status:   Married                               What year    were you married? 2022 Do you live in a house, apartment, assisted living, condo, trailer, etc.? House Is it one or more stories? One How many persons live in your home? 3  Do you have pets in your home?( please list) No Highest Level of education c   ompleted? College Current or past profession: Associate Professor, Salon Owner. Do you exercise? No  Type and how often? Do you have a living will? Yes Do you have a DNR form?   No                               If not, do you    want to discuss one? Do you have signed POA/HPOA forms?    No                    If so, please bring to you appointment  Do you have any difficulty bathing or dressing yourself? Yes Do you have any difficulty preparing food or eating? No Do you hav   e any difficulty managing your medications? No Do you have any difficulty managing your finances? No Do you have any difficulty affording your medications? No   Social Drivers of Corporate investment banker Strain: Not on file  Food Insecurity: Food Insecurity Present (09/18/2022)   Hunger Vital Sign    Worried About Running Out of Food in the Last Year: Never true    Ran Out of Food in the Last Year: Sometimes true  Transportation Needs: No Transportation Needs (09/18/2022)   PRAPARE - Administrator, Civil Service (Medical): No    Lack of Transportation (Non-Medical): No  Physical Activity: Not on file  Stress: Not on file  Social Connections: Unknown (10/13/2021)   Received from [facility], [facility]   Social Network    Social Network: Not on file  Intimate Partner Violence: Not At Risk (09/18/2022)   Humiliation, Afraid, Rape, and Kick questionnaire    Fear  of Current or Ex-Partner: No    Emotionally Abused: No    Physically Abused: No    Sexually Abused: No      I spent *** minutes of face-to-face and non-face-to-face time with patient.  This included previsit chart review, lab review, study review, order entry, electronic health record documentation, patient education and discussion regarding above diagnoses and treatment plan and answered all other questions to patient's satisfaction  Johny Nap, [facility]  [facility] [address] [place], [place] [address]  Phone [phone] Fax [phone] Note: This document was prepared with digital dictation and possible smart phrase technology. Any transcriptional errors that result from this process are unintentional.

## 2023-10-25 ENCOUNTER — Ambulatory Visit: Payer: PRIVATE HEALTH INSURANCE | Admitting: Adult Health

## 2023-10-25 ENCOUNTER — Ambulatory Visit: Admitting: Clinical

## 2023-10-26 ENCOUNTER — Ambulatory Visit: Payer: Commercial Managed Care - HMO | Admitting: Family

## 2023-10-27 ENCOUNTER — Ambulatory Visit (INDEPENDENT_AMBULATORY_CARE_PROVIDER_SITE_OTHER): Payer: PRIVATE HEALTH INSURANCE | Admitting: Family Medicine

## 2023-10-27 VITALS — BP 103/71 | HR 103 | Temp 97.7°F | Ht 67.0 in | Wt 269.0 lb

## 2023-10-27 DIAGNOSIS — E669 Obesity, unspecified: Secondary | ICD-10-CM

## 2023-10-27 DIAGNOSIS — E7849 Other hyperlipidemia: Secondary | ICD-10-CM

## 2023-10-27 DIAGNOSIS — R7303 Prediabetes: Secondary | ICD-10-CM | POA: Insufficient documentation

## 2023-10-27 DIAGNOSIS — E785 Hyperlipidemia, unspecified: Secondary | ICD-10-CM

## 2023-10-27 DIAGNOSIS — E559 Vitamin D deficiency, unspecified: Secondary | ICD-10-CM | POA: Diagnosis not present

## 2023-10-27 DIAGNOSIS — Z6841 Body Mass Index (BMI) 40.0 and over, adult: Secondary | ICD-10-CM

## 2023-10-27 MED ORDER — VITAMIN D (ERGOCALCIFEROL) 1.25 MG (50000 UNIT) PO CAPS: 50000.0000 [IU] | ORAL_CAPSULE | ORAL | 0 refills | Status: DC

## 2023-10-27 NOTE — Progress Notes (Signed)
 SUBJECTIVE:  Chief Complaint: Obesity  Interim History: Patient voices she couldn't finish the amount of food on plan.  She was able to get 2 eggs and 1 piece of toast and then only ate half the sandwich.  Over the next few weeks she wants to work on getting more of the food in.  Patient is planning to go to [place] for Memorial Day weekend for her husband's aunts 75th birthday.   Don is here to discuss her progress with her obesity treatment plan. She is on the Category 3 Plan and states she is following her eating plan approximately 75 % of the time. She states she is walking.   OBJECTIVE: Visit Diagnoses: Problem List Items Addressed This Visit       Other   Hyperlipidemia   LDL elevated at 168, HDL 61, Triglycerides 71.  Not on medication.  We discussed the importance of limiting total saturated fat to 20% or less of total intake.  Will need repeat labs in 3-4 months.      Vitamin D  deficiency - Primary   Discussed importance of vitamin d  supplementation.  Vitamin d  supplementation has been shown to decrease fatigue, decrease risk of progression to insulin  resistance and then prediabetes, decreases risk of falling in older age and can even assist in decreasing depressive symptoms in PTSD.   Prescription for Vitamin D  sent in.        Relevant Medications   Vitamin D , Ergocalciferol , (DRISDOL) 1.25 MG (50000 UNIT) CAPS capsule   Prediabetes   Pathophysiology of progression through insulin  resistance to prediabetes and diabetes was discussed at length today.  Patient to continue to monitor and be in control of total intake of snack calories which may be simple carbohydrates but should be consumed only after the patient has taken in all the nutrition for the day.  Macronutrient identification, classification and daily intake ratios were discussed.  Plan to repeat labs in 3 months to monitor both hemoglobin A1c and insulin  levels.  No medications at this time as patient is not  having significant hunger or cravings that would make following meal plan more difficult.         Other Visit Diagnoses       BMI 40.0-44.9, adult (HCC)         Obesity, Beginning BMI 42.4           Vitals Temp: 97.7 F (36.5 C) BP: 103/71 Pulse Rate: (!) 103 SpO2: 100 %   Anthropometric Measurements Height: 5\' 7"  (1.702 m) Weight: 269 lb (122 kg) BMI (Calculated): 42.12 Weight at Last Visit: 268 lb Weight Lost Since Last Visit: 0 Weight Gained Since Last Visit: 1 Starting Weight: 268 lb Total Weight Loss (lbs): 0 lb (0 kg)   Body Composition  Body Fat %: 50.1 % Fat Mass (lbs): 135 lbs Muscle Mass (lbs): 127.8 lbs Total Body Water (lbs): 96.2 lbs Visceral Fat Rating : 13   Other Clinical Data Today's Visit #: 2 Starting Date: 10/13/23     ASSESSMENT AND PLAN:  Diet: Nancy Garrett is currently in the action stage of change. As such, her goal is to continue with weight loss efforts and has agreed to the Category 3 Plan.   Exercise:  All adults should avoid inactivity. Some activity is better than none, and adults who participate in any amount of physical activity, gain some health benefits.  Behavior Modification:  We discussed the following Behavioral Modification Strategies today: increasing lean protein intake, decreasing simple carbohydrates,  increasing vegetables, meal planning and cooking strategies, and keeping healthy foods in the home.   Return in about 3 weeks (around 11/17/2023).   She was informed of the importance of frequent follow up visits to maximize her success with intensive lifestyle modifications for her multiple health conditions.  Attestation Statements:   Reviewed by clinician on day of visit: allergies, medications, problem list, medical history, surgical history, family history, social history, and previous encounter notes.    Donaciano Frizzle, MD

## 2023-10-27 NOTE — Assessment & Plan Note (Signed)
 LDL elevated at 168, HDL 61, Triglycerides 71.  Not on medication.  We discussed the importance of limiting total saturated fat to 20% or less of total intake.  Will need repeat labs in 3-4 months.

## 2023-10-27 NOTE — Assessment & Plan Note (Signed)
 Discussed importance of vitamin d supplementation.  Vitamin d supplementation has been shown to decrease fatigue, decrease risk of progression to insulin resistance and then prediabetes, decreases risk of falling in older age and can even assist in decreasing depressive symptoms in PTSD.   Prescription for Vitamin D sent in.

## 2023-10-27 NOTE — Assessment & Plan Note (Signed)

## 2023-10-28 NOTE — Progress Notes (Addendum)
 Chief Complaint  Patient presents with   Migraine    Rm 3 with children Pt is well, repots she has a constant migraine daily. She is yet to hear from [facility] eye care to schedule appt.       ASSESSMENT AND PLAN  Nancy Garrett is a 33 y.o. female    Chronic migraines Daily headaches Obesity  Risk for intracranial hypertension, will place new referral to ophthalmologist for further evaluation  Recommend initiating topiramate  25 mg twice daily, consider dosage increase after 2 weeks if needed but advised to call sooner with any difficulty tolerating.  Discussed potential side effects.  If ophtho eval shows papilledema, consider LP  Continue to follow with healthy weight and wellness, discussed importance of weight loss, topiramate  may further assist with this  Syncope   Sensation of feeling warm all over, headache worsens, pass out  Recommend completion of EEG to rule out seizures  Cardiac monitor overall benign  If this is normal, consider migraine with brainstem aura as she has previously had episode of slurred speech and vertigo associated with her migraines     Follow up in 4-6 months with Dr. Onita for further discussion or call earlier if needed      DIAGNOSTIC DATA (LABS, IMAGING, TESTING) - I reviewed patient records, labs, notes, testing and imaging myself where available.  ADDENDUM 12/22/2023 JM: Dr. Octavia evaluation 12/16/2023: No ocular etiology for headache noted, no evidence of IIH      MEDICAL HISTORY:  Update 10/28/2023 JM: Patient returns for follow-up visit accompanied by her 2 daughters. Reports continued daily frontal headaches which can worsen in severity and can be debilitating. Denies routine use of OTC pain relievers.  She was never contacted by ophthalmology to schedule visit and she was not sure where referral was sent to call to schedule. She was having issues with blurred vision but this improved after updating glasses prescription.  Denies  tinnitus.  Does endorse weight gain since she delivered her daughter over 1 year ago despite efforts at weight loss, she is currently working with healthy weight and wellness.  She has since stopped breast-feeding.  She was previously seen by cardiology for episodes of palpitations and syncope which was occurring frequently when she was pregnant. Reports sensation of feeling warm over her entire body, headache will worsen and then will lose consciousness. Only lasts for a few seconds. No seizure like activity, no postictal symptoms after.  Cardiology recommended repeat cardiac monitor which was overall benign, declined interest in pursuing ILR.  Reports last syncopal event was last Saturday, episode sporadic and without clear cause/trigger, can occur 1-2 times per week and then not reoccur for 2-3 weeks.   She does report that her daughter 3937 mo old) was hospitalized for 5 days back in April after she had a syncopal event, after extensive workup no clear cause found and felt possibly related to recent viral illness although patient concerned as she was not recently sick. She is concerned that she possibly passed something down to her.       Consult visit 04/15/2023 Dr. Onita: Nancy Garrett, is a 33 year old female seen in request by her primary care nurse practitioner Ngetich, Dinah for evaluation of migraine headache initial evaluation April 15, 2023   History is obtained from the patient and review of electronic medical records. I personally reviewed pertinent available imaging films in PACS.   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  She had long  history of chronic migraine, increased migraine headache during her first pregnancy in 2017, could not function well, to the point have bed resting, her headache improved postpartum  Began to have frequent headaches again during her recent pregnancy in November 2023, has been persistent despite delivery in April 2024  3 to 4 days  out of the week she would have moderate headaches, with weather changes in September 2024 she began to have daily pressure headaches, multiple times a day, pressure, bilateral frontal headache, with some light noise sensitivity, tried over-the-counter medication with limited help  She denies significant visual change, had a gastric bypass surgery in the past, with significant weight loss with some weight gain again with her second pregnancy       PHYSICAL EXAM:   Vitals:   11/01/23 0757  BP: 107/70  Pulse: 85  Weight: 273 lb (123.8 kg)  Height: 5' 6 (1.676 m)    Not recorded     Body mass index is 44.06 kg/m.  PHYSICAL EXAMNIATION:  Gen: NAD, conversant, well nourised, well groomed                     Cardiovascular: Regular rate rhythm, no peripheral edema, warm, nontender. Eyes: Conjunctivae clear without exudates or hemorrhage Neck: Supple, no carotid bruits. Pulmonary: Clear to auscultation bilaterally   NEUROLOGICAL EXAM:  MENTAL STATUS: Speech/cognition: Awake, alert, oriented to history taking and casual conversation CRANIAL NERVES: CN II: Visual fields are full to confrontation.  CN III, IV, VI: extraocular movement are normal. No ptosis. CN V: Facial sensation is intact to light touch CN VII: Face is symmetric with normal eye closure  CN VIII: Hearing is normal to causal conversation. CN IX, X: Phonation is normal. CN XI: Head turning and shoulder shrug are intact  MOTOR: There is no pronator drift of out-stretched arms. Muscle bulk and tone are normal. Muscle strength is normal.  REFLEXES: Reflexes are 2+ and symmetric at the biceps, triceps, knees, and ankles. Plantar responses are flexor.  SENSORY: Intact to light touch, pinprick and vibratory sensation are intact in fingers and toes.  COORDINATION: There is no trunk or limb dysmetria noted.  GAIT/STANCE: Posture is normal. Gait is steady with normal steps, base, arm swing, and turning. Heel  and toe walking are normal. Tandem gait is normal.     REVIEW OF SYSTEMS:  Full 14 system review of systems performed and notable only for as above All other review of systems were negative.   ALLERGIES: Allergies  Allergen Reactions   Gadolinium Hives, Itching and Nausea And Vomiting    Pt reports she has allergy to MRI contrast not CT contrast. AV, RN 11/24/22   Iodinated Contrast Media    Ivp Dye [Iodinated Contrast Media] Nausea And Vomiting    HOME MEDICATIONS: Current Outpatient Medications  Medication Sig Dispense Refill   Prenatal Vit-Fe Fumarate-FA (PRENATAL MULTIVITAMIN) TABS tablet Take 1 tablet by mouth daily at 12 noon.     Vitamin D , Ergocalciferol , (DRISDOL ) 1.25 MG (50000 UNIT) CAPS capsule Take 1 capsule (50,000 Units total) by mouth every 7 (seven) days. 12 capsule 0   No current facility-administered medications for this visit.    PAST MEDICAL HISTORY: Past Medical History:  Diagnosis Date   Anemia    Hx   Anxiety    Back pain    Chronic abdominal pain    Constipation    Head ache    Hip pain    Prediabetes    Syncope  cardiology and neurology evaluations ~ 03/2022    PAST SURGICAL HISTORY: Past Surgical History:  Procedure Laterality Date   DILITATION & CURRETTAGE/HYSTROSCOPY WITH HYDROTHERMAL ABLATION N/A 04/06/2023   Procedure: DILATATION & CURETTAGE/HYSTEROSCOPY WITH HYDROTHERMAL ABLATION;  Surgeon: Fredirick Glenys RAMAN, MD;  Location: [facility] OR;  Service: Gynecology;  Laterality: N/A;   LAPAROSCOPIC BILATERAL SALPINGECTOMY Bilateral 04/06/2023   Procedure: LAPAROSCOPIC BILATERAL SALPINGECTOMY;  Surgeon: Fredirick Glenys RAMAN, MD;  Location: [facility] OR;  Service: Gynecology;  Laterality: Bilateral;   LAPAROSCOPIC GASTRIC SLEEVE RESECTION  08/24/2017   Dr. Unice Carpen. Done in [place]   TONSILLECTOMY     As a child   TUBAL LIGATION  03/2023    FAMILY HISTORY: Family History  Problem Relation Age of Onset   Hypertension Mother    High blood pressure  Mother    Diabetes Father    Diabetes Sister    Stroke Sister    High blood pressure Sister     SOCIAL HISTORY: Social History   Socioeconomic History   Marital status: Married    Spouse name: Not on file   Number of children: Not on file   Years of education: Not on file   Highest education level: Associate degree: academic program  Occupational History   Occupation: Cosmetologist  Tobacco Use   Smoking status: Never   Smokeless tobacco: Never  Vaping Use   Vaping status: Never Used  Substance and Sexual Activity   Alcohol use: Not Currently    Comment: Occasionally   Drug use: Never   Sexual activity: Yes  Other Topics Concern   Not on file  Social History Narrative   ** Merged History Encounter **       Tobacco use, amount per day now: Past tobacco use, amount per day: How many years did you use tobacco: Alcohol use (drinks per week): Diet: Do you drink/eat things with caffeine: No Marital status:   Married                               What year    were you married? 2022 Do you live in a house, apartment, assisted living, condo, trailer, etc.? House Is it one or more stories? One How many persons live in your home? 3  Do you have pets in your home?( please list) No Highest Level of education c   ompleted? College Current or past profession: Associate Professor, Salon Owner. Do you exercise? No                                 Type and how often? Do you have a living will? Yes Do you have a DNR form?   No                               If not, do you    want to discuss one? Do you have signed POA/HPOA forms?    No                    If so, please bring to you appointment  Do you have any difficulty bathing or dressing yourself? Yes Do you have any difficulty preparing food or eating? No Do you hav   e any difficulty managing your medications? No Do you have any difficulty managing your finances? No Do  you have any difficulty affording your medications? No    Social Drivers of Corporate investment banker Strain: Low Risk  (10/30/2023)   Overall Financial Resource Strain (CARDIA)    Difficulty of Paying Living Expenses: Not very hard  Food Insecurity: No Food Insecurity (10/30/2023)   Hunger Vital Sign    Worried About Running Out of Food in the Last Year: Never true    Ran Out of Food in the Last Year: Never true  Transportation Needs: No Transportation Needs (10/30/2023)   PRAPARE - Administrator, Civil Service (Medical): No    Lack of Transportation (Non-Medical): No  Physical Activity: Unknown (10/30/2023)   Exercise Vital Sign    Days of Exercise per Week: 0 days    Minutes of Exercise per Session: Not on file  Stress: No Stress Concern Present (10/30/2023)   [employer] of Occupational Health - Occupational Stress Questionnaire    Feeling of Stress : Not at all  Social Connections: Moderately Integrated (10/30/2023)   Social Connection and Isolation Panel [NHANES]    Frequency of Communication with Friends and Family: More than three times a week    Frequency of Social Gatherings with Friends and Family: Once a week    Attends Religious Services: 1 to 4 times per year    Active Member of [employer] or Organizations: No    Attends Banker Meetings: Not on file    Marital Status: Married  Catering manager Violence: Not At Risk (09/18/2022)   Humiliation, Afraid, Rape, and Kick questionnaire    Fear of Current or Ex-Partner: No    Emotionally Abused: No    Physically Abused: No    Sexually Abused: No      I spent 50 minutes of face-to-face and non-face-to-face time with patient. This is our first time meeting and time has been spent reviewing past medical history and relevant medical records. This included previsit chart review, lab review, study review, order entry, electronic health record documentation, patient education and discussion regarding above diagnoses and treatment plan and answered all other  questions to patient's satisfaction  Harlene Bogaert, [facility]  [facility] [address] [place], [place] [address]  Phone [phone] Fax [phone] Note: This document was prepared with digital dictation and possible smart phrase technology. Any transcriptional errors that result from this process are unintentional.

## 2023-11-01 ENCOUNTER — Encounter: Payer: Self-pay | Admitting: Family

## 2023-11-01 ENCOUNTER — Ambulatory Visit (INDEPENDENT_AMBULATORY_CARE_PROVIDER_SITE_OTHER): Payer: PRIVATE HEALTH INSURANCE | Admitting: Adult Health

## 2023-11-01 ENCOUNTER — Ambulatory Visit: Admitting: Family

## 2023-11-01 ENCOUNTER — Telehealth: Payer: Self-pay | Admitting: Adult Health

## 2023-11-01 ENCOUNTER — Encounter: Payer: Self-pay | Admitting: Adult Health

## 2023-11-01 VITALS — BP 107/70 | HR 85 | Ht 66.0 in | Wt 273.0 lb

## 2023-11-01 VITALS — BP 108/70 | HR 90 | Temp 97.8°F | Resp 18 | Ht 66.0 in | Wt 274.0 lb

## 2023-11-01 DIAGNOSIS — R350 Frequency of micturition: Secondary | ICD-10-CM

## 2023-11-01 DIAGNOSIS — G8929 Other chronic pain: Secondary | ICD-10-CM | POA: Diagnosis not present

## 2023-11-01 DIAGNOSIS — G43709 Chronic migraine without aura, not intractable, without status migrainosus: Secondary | ICD-10-CM | POA: Diagnosis not present

## 2023-11-01 DIAGNOSIS — R55 Syncope and collapse: Secondary | ICD-10-CM

## 2023-11-01 DIAGNOSIS — R300 Dysuria: Secondary | ICD-10-CM

## 2023-11-01 DIAGNOSIS — G43009 Migraine without aura, not intractable, without status migrainosus: Secondary | ICD-10-CM

## 2023-11-01 DIAGNOSIS — R519 Headache, unspecified: Secondary | ICD-10-CM

## 2023-11-01 DIAGNOSIS — M5442 Lumbago with sciatica, left side: Secondary | ICD-10-CM

## 2023-11-01 DIAGNOSIS — R7303 Prediabetes: Secondary | ICD-10-CM

## 2023-11-01 DIAGNOSIS — B353 Tinea pedis: Secondary | ICD-10-CM

## 2023-11-01 LAB — POCT URINALYSIS DIPSTICK
Bilirubin, UA: NEGATIVE
Glucose, UA: NEGATIVE
Ketones, UA: NEGATIVE
Nitrite, UA: NEGATIVE
Protein, UA: POSITIVE — AB
Spec Grav, UA: 1.01 (ref 1.010–1.025)
Urobilinogen, UA: 1 U/dL
pH, UA: 7.5 (ref 5.0–8.0)

## 2023-11-01 MED ORDER — TOPIRAMATE 25 MG PO TABS: 25.0000 mg | ORAL_TABLET | Freq: Two times a day (BID) | ORAL | 3 refills | Status: DC

## 2023-11-01 MED ORDER — TERBINAFINE HCL 1 % EX CREA: TOPICAL_CREAM | Freq: Every day | CUTANEOUS | Status: AC

## 2023-11-01 MED ORDER — IBUPROFEN 800 MG PO TABS: 800.0000 mg | ORAL_TABLET | Freq: Three times a day (TID) | ORAL | 1 refills | Status: DC | PRN

## 2023-11-01 NOTE — Progress Notes (Signed)
 Provider: Christean Courts FNP-C   Adonys Wildes, Elijio Guadeloupe, NP  Patient Care Team: Krystena Reitter, Elijio Guadeloupe, NP as PCP - General (Family Medicine) Tobb, Kardie, DO as PCP - Cardiology (Cardiology) Loetta Ringer, CNM as PCP - OBGYN (Obstetrics and Gynecology) Patient, No Pcp Per (General Practice)  Extended Emergency Contact Information Primary Emergency Contact: Warren,Veronique  [place] Home Phone: [phone] Relation: Mother Secondary Emergency Contact: Austin,Diane  [place] Home Phone: [phone] Relation: Grandmother  Code Status:  Full CODE Goals of care: Advanced Directive information    11/01/2023   10:03 AM  Advanced Directives  Does Patient Have a Medical Advance Directive? No  Would patient like information on creating a medical advance directive? No - Patient declined     Chief Complaint  Patient presents with   Annual Exam    Physical.   Discussed the use of AI scribe software for clinical note transcription with the patient, who gave verbal consent to proceed.  History of Present Illness   Nancy Garrett is a 33 year old female who presents with symptoms suggestive of a urinary tract infection and chronic back pain. She is accompanied by her young daughter.  She experiences frequent urination and dysuria at the end of urination, reminiscent of a urinary tract infection she had during her pregnancy. Her mother noted that her symptoms resemble a UTI.  She has chronic back pain that has worsened, making it difficult to pick up her daughter. The pain sometimes radiates from her lower back upwards, becoming severe enough to prevent her from picking up her children. Ibuprofen  800 mg provides relief for about 12 hours, but she is hesitant to take it regularly. She has been attending a back clinic and recently visited a neurologist who suggested starting Topamax .  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  She has a history of low vitamin D  levels, with a recent measurement of 15.2, and has been prescribed 50,000 units of vitamin D . Her hemoglobin has improved to 11.1, and she has resumed taking prenatal vitamins for the iron content. Her A1c has increased, and her total cholesterol is 241. She is following a diet similar to the one she had post-surgery but is still gaining weight.  She mentions a past surgical history of ablation in October of the previous year, which successfully stopped her menstrual cycles. She also had a history of severe menstrual pain requiring ibuprofen .  She expresses difficulty accessing physical [facility] at the aquatic center due to scheduling issues and mentions that she finds morning sessions more comfortable. She has not been able to attend since her pregnancy.  She has been prescribed a foot cream in the past, which was effective, but she lost it during a move and is seeking a refill.   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 Dorma Gash. Done in [place]   TONSILLECTOMY     As a child   TUBAL LIGATION  03/2023    Allergies  Allergen Reactions    Gadolinium Hives, Itching and Nausea And Vomiting    Pt reports she has allergy to MRI contrast not CT contrast. AV, RN 11/24/22   Iodinated Contrast Media    Ivp Dye [Iodinated Contrast Media] Nausea And Vomiting    Allergies as of 11/01/2023       Reactions   Gadolinium Hives, Itching, Nausea And Vomiting   Pt reports she has allergy to MRI contrast not CT contrast. AV, RN 11/24/22   Iodinated Contrast Media    Ivp Dye [iodinated Contrast Media] Nausea And Vomiting        Medication List        Accurate as of Nov 01, 2023 10:44 AM. If you have any questions, ask your nurse or doctor.          ibuprofen  800 MG tablet Commonly known as: ADVIL  Take 1 tablet (800 mg total) by mouth every 8 (eight) hours as needed. Started by: Lamine Laton C Jaxsyn Azam   prenatal multivitamin Tabs tablet Take 1 tablet by mouth daily at 12 noon.   topiramate  25 MG tablet Commonly known as: TOPAMAX  Take 1 tablet (25 mg total) by mouth 2 (two) times daily. Started by: Johny Nap   Vitamin D  (Ergocalciferol ) 1.25 MG (50000 UNIT) Caps capsule Commonly known as: DRISDOL  Take 1 capsule (50,000 Units total) by mouth every 7 (seven) days.        Review of Systems  Constitutional:  Negative for appetite change, chills, fatigue, fever and unexpected weight change.  HENT:  Negative for congestion, ear discharge, ear pain, facial swelling, hearing loss, nosebleeds, postnasal drip, rhinorrhea, sinus pressure, sinus pain, sneezing, sore throat, tinnitus and trouble swallowing.   Eyes:  Negative for pain, discharge, redness, itching and visual disturbance.  Respiratory:  Negative for cough, chest tightness, shortness of breath and wheezing.   Cardiovascular:  Negative for chest pain, palpitations and leg swelling.  Gastrointestinal:  Negative for abdominal distention, abdominal pain, constipation, diarrhea, nausea and vomiting.  Endocrine: Negative for cold intolerance, heat intolerance, polydipsia,  polyphagia and polyuria.  Genitourinary:  Positive for dysuria and frequency. Negative for difficulty urinating, flank pain and urgency.  Musculoskeletal:  Positive for arthralgias and back pain. Negative for gait problem, joint swelling, myalgias, neck pain and neck stiffness.  Skin:  Negative for color change, pallor, rash and wound.  Neurological:  Negative for dizziness, syncope, speech difficulty, weakness, light-headedness, numbness and headaches.  Hematological:  Does not bruise/bleed easily.  Psychiatric/Behavioral:  Negative for agitation, behavioral problems, confusion, hallucinations and sleep disturbance. The patient is not nervous/anxious.     Immunization History  Administered Date(s) Administered   Hepatitis B 03/06/2002, 05/01/2002, 09/04/2002   Influenza-Unspecified 05/31/2015   Tdap 08/04/2007   Pertinent  Health Maintenance Due  Topic Date Due   INFLUENZA VACCINE  01/14/2024      08/04/2020   10:58 PM 08/05/2020    9:40 AM 09/22/2021    9:59 AM 05/13/2022    4:54 PM 11/01/2023   10:03 AM  Fall Risk  Falls in the past year?   0  0  Was there an injury with Fall?   0  0  Fall Risk Category Calculator   0  0  Fall Risk Category (Retired)   Low    (RETIRED) Patient Fall Risk Level Low fall risk Low fall risk Low fall risk Low fall risk   Patient at Risk for Falls Due to   No Fall Risks  No Fall Risks  Fall risk Follow up   Falls evaluation completed  Falls evaluation completed   Functional Status Survey:    Vitals:   11/01/23 1004  BP: 108/70  Pulse: 90  Resp: 18  Temp: 97.8 F (36.6 C)  SpO2: 99%  Weight: 274 lb (124.3 kg)  Height: 5\' 6"  (1.676 m)   Body mass index is 44.22 kg/m. Physical Exam  GENERAL: Alert, cooperative, well developed, no acute distress HEENT: Normocephalic, normal oropharynx, moist mucous membranes, ears normal, nose normal, eyes normal NECK: Neck normal CHEST: Clear to auscultation bilaterally, no wheezes, rhonchi, or  crackles CARDIOVASCULAR: Normal heart rate and rhythm, S1 and S2 normal without murmurs ABDOMEN: Soft, non-tender, non-distended, without organomegaly, normal bowel sounds EXTREMITIES: No cyanosis or edema NEUROLOGICAL: Cranial nerves grossly intact, moves all extremities without gross motor or sensory deficit      Labs reviewed: Recent Labs    03/29/23 1500 10/13/23 1055  NA 139 139  K 3.8 4.2  CL 107 103  CO2 25 22  GLUCOSE 102* 86  BUN 13 14  CREATININE 0.66 0.59  CALCIUM 9.1 9.2   Recent Labs    10/13/23 1055  AST 11  ALT 6  ALKPHOS 127*  BILITOT 0.3  PROT 6.9  ALBUMIN 4.3   Recent Labs    01/18/23 1120 03/29/23 1500 10/13/23 1055  WBC 7.6 10.3 8.9  NEUTROABS  --   --  5.5  HGB 11.3 10.6* 11.1  HCT 36.0 34.6* 36.9  MCV 83 83.2 83  PLT 260 295 299   Lab Results  Component Value Date   TSH 1.220 10/13/2023   Lab Results  Component Value Date   HGBA1C 6.1 (H) 10/13/2023   Lab Results  Component Value Date   CHOL 241 (H) 10/13/2023   HDL 61 10/13/2023   LDLCALC 168 (H) 10/13/2023   TRIG 71 10/13/2023   CHOLHDL 3.6 09/22/2021    Significant Diagnostic Results in last 30 days:  No results found.  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 Pain is severe, affecting daily activities such as lifting her child. Ibuprofen  800 mg provides relief for up to 12 hours. She is hesitant to take medication regularly due to concerns about pills. - Prescribe ibuprofen  800 mg to be taken as needed, up to three times a day with food to prevent gastrointestinal irritation. - Encourage continuation of physical therapy and aquatic exercises. - Suggest taking ibuprofen  30 minutes to 1 hour before physical therapy sessions to minimize pain during exercise.  Urinary symptoms without confirmed UTI Symptoms of frequent urination and dysuria at the end of urination.  Urinalysis shows blood, protein, and white blood cells, but negative for nitrites, indicating no confirmed UTI. Differential includes possible irritation or inflammation without infection. - Send urine sample for culture to identify any potential bacterial growth. - Advise taking cranberry tablets to help alleviate symptoms.  Prediabetes A1c has increased, indicating prediabetes. She reports following a post-surgery diet but is still gaining weight. Topamax  prescribed by neurologist  may help with weight management and depression. - Advise dietary modifications to reduce A1c, focusing on reducing starchy foods and increasing plant-based proteins. - Plan to recheck A1c in six months.  Hyperlipidemia Total cholesterol is elevated at 241 mg/dL, with high LDL levels. Triglycerides are within normal range. Dietary factors such as cheese and pork may be contributing to elevated cholesterol. - Advise reducing intake of cheese and pork. - Encourage consumption of plant-based proteins to help lower LDL levels.  Vitamin D  deficiency Vitamin D  level is low at 15.2 ng/mL. She has been prescribed vitamin D  50,000 IU by the weight loss clinic. - Continue vitamin D  50,000 IU as prescribed by the weight loss clinic. - Plan to recheck vitamin D  levels at follow-up.  Heavy menstrual bleeding post-ablation She underwent ablation in October of last year and has not had a menstrual period since, which has resolved previous issues with heavy bleeding.  General Health Maintenance Tetanus vaccine is due. - Obtain tetanus vaccine from a local pharmacy and ensure records are updated.   Family/ staff Communication: Reviewed plan of care with patient verbalized understanding   Labs/tests ordered:  - POC Urinalysis Dipstick  - Urine Culture   Next Appointment : Return in about 6 months (around 05/03/2024) for medical mangement of chronic issues.Aaron Aas   Spent 30 minutes of Face to face and non-face to face with  patient  >50% time spent counseling; reviewing medical record; tests; labs; documentation and developing future plan of care.   Estil Heman, NP

## 2023-11-01 NOTE — Patient Instructions (Addendum)
 Your Plan:   Start topamax  25mg  twice daily for headaches  Consider increasing dose after a couple weeks if headaches persist or call earlier if needed with any difficulty tolerating   Referral placed again to ophthalmology Dr. Candi Chafe to evaluate for idiopathic intracranial hypertension (or IIH) - please call them to schedule if do not hear from them in the next 1-2 weeks  Order placed to complete EEG     Follow up with in 4-6 months with Dr. Gracie Lav or call earlier if needed     Thank you for coming to see us  at [facility]. I hope we have been able to provide you high quality care today.  You may receive a patient satisfaction survey over the next few weeks. We would appreciate your feedback and comments so that we may continue to improve ourselves and the health of our patients.

## 2023-11-01 NOTE — Telephone Encounter (Signed)
 Referral for ophthalmology fax to [facility]. Phone: [phone], Fax: [phone]

## 2023-11-03 LAB — URINE CULTURE
MICRO NUMBER:: 16472978
SPECIMEN QUALITY:: ADEQUATE

## 2023-11-04 ENCOUNTER — Other Ambulatory Visit: Payer: Self-pay

## 2023-11-04 ENCOUNTER — Ambulatory Visit: Payer: Self-pay | Admitting: Family

## 2023-11-04 MED ORDER — CIPROFLOXACIN HCL 500 MG PO TABS: 500.0000 mg | ORAL_TABLET | Freq: Two times a day (BID) | ORAL | 0 refills | Status: AC

## 2023-11-04 MED ORDER — SACCHAROMYCES BOULARDII 250 MG PO CAPS: 250.0000 mg | ORAL_CAPSULE | Freq: Two times a day (BID) | ORAL | 0 refills | Status: AC

## 2023-11-10 ENCOUNTER — Other Ambulatory Visit: Payer: Self-pay | Admitting: Family

## 2023-11-10 NOTE — Telephone Encounter (Signed)
 Pharmacy Requested refill.  Pended Rx and sent to [facility] for approval.

## 2023-11-15 ENCOUNTER — Ambulatory Visit: Admitting: Neurology

## 2023-11-15 DIAGNOSIS — R55 Syncope and collapse: Secondary | ICD-10-CM

## 2023-11-19 NOTE — BH Specialist Note (Signed)
 Integrated Behavioral Health via Telemedicine Visit  11/22/2023 Irania Garrett [id number]  Number of Integrated Behavioral Health Clinician visits: Additional Visit  Session Start time: 0921   Session End time: 1024  Total time in minutes: 63   Referring Provider: Loetta Ringer, CNM Patient/Family location: Home [facility] Provider location: [facility]  All persons participating in visit: [name] and Nancy Garrett   Types of Service: Individual psychotherapy and Video visit  I connected with Nancy Garrett and/or Nancy Garrett's n/a via  Telephone or Video Enabled Telemedicine Application  (Video is Caregility application) and verified that I am speaking with the correct person using two identifiers. Discussed confidentiality: Yes   I discussed the limitations of telemedicine and the availability of in person appointments.  Discussed there is a possibility of technology failure and discussed alternative modes of communication if that failure occurs.  I discussed that engaging in this telemedicine visit, they consent to the provision of behavioral healthcare and the services will be billed under their insurance.  Patient and/or legal guardian expressed understanding and consented to Telemedicine visit: Yes   Presenting Concerns: Patient and/or family reports the following symptoms/concerns: Continued anxious thoughts and feelings regarding complicated family dynamics, daughter's health, recent weight gain and making the best decision regarding home.  Duration of problem: Ongoing; Severity of problem: moderate  Patient and/or Family's Strengths/Protective Factors: Social connections, Concrete supports in place (healthy food, safe environments, etc.), Sense of purpose, and Physical Health (exercise, healthy diet, medication compliance, etc.)  Goals Addressed: Patient will:  Reduce symptoms  of: anxiety and stress    Progress towards Goals: Ongoing  Interventions: Interventions utilized:  Motivational Interviewing and Supportive Reflection Standardized Assessments completed: Not Needed  Patient and/or Family Response: Patient agrees with treatment plan.   Assessment: Patient currently experiencing Adjustment disorder with anxious mood.   Patient may benefit from continued therapeutic intervention  .  Plan: Follow up with behavioral health clinician on : One month Behavioral recommendations:  -Continue daily self-coping strategies(priority on setting and maintaining healthy boundaries with others and maintaining overall health and emotional wellbeing); attending all upcoming medical appointments -Continue discussions with husband on best decision for house (rent vs sell) Referral(s): Integrated [employer] (In Clinic)  I discussed the assessment and treatment plan with the patient and/or parent/guardian. They were provided an opportunity to ask questions and all were answered. They agreed with the plan and demonstrated an understanding of the instructions.   They were advised to call back or seek an in-person evaluation if the symptoms worsen or if the condition fails to improve as anticipated.  Georgia Kipper, LCSW     11/01/2023   10:03 AM 10/13/2023    7:37 AM 12/01/2022    9:42 AM 08/19/2022   11:15 AM 06/17/2022    9:27 AM  Depression screen PHQ 2/9  Decreased Interest 0 0 0 0 0  Down, Depressed, Hopeless 0 0 0 0 0  PHQ - 2 Score 0 0 0 0 0  Altered sleeping  1 0 0 1  Tired, decreased energy  1 0 2 0  Change in appetite  0 0 0 0  Feeling bad or failure about yourself   0 0 0 0  Trouble concentrating  0 0 0 0  Moving slowly or fidgety/restless  0 0 0 0  Suicidal thoughts   0 0 0  PHQ-9 Score  2 0 2 1  12/01/2022    9:40 AM 08/19/2022   11:15 AM 06/17/2022    9:27 AM 06/16/2022    9:56 AM  GAD 7 : Generalized Anxiety Score  Nervous,  Anxious, on Edge 1 0 1 1  Control/stop worrying 1 0 0 2  Worry too much - different things 1 0 0 0  Trouble relaxing 0 0 1 1  Restless 0 0 0 0  Easily annoyed or irritable 1 1 1 1   Afraid - awful might happen 1 0 0 1  Total GAD 7 Score 5 1 3  6

## 2023-11-22 ENCOUNTER — Ambulatory Visit: Admitting: Clinical

## 2023-11-22 DIAGNOSIS — F4322 Adjustment disorder with anxiety: Secondary | ICD-10-CM | POA: Diagnosis not present

## 2023-11-25 ENCOUNTER — Ambulatory Visit: Payer: Self-pay | Admitting: Adult Health

## 2023-11-25 NOTE — Procedures (Signed)
   HISTORY: 33-year-old female with history of frequent headaches, passing out episode  TECHNIQUE:  This is a routine 16 channel EEG recording with one channel devoted to a limited EKG recording.  It was performed during wakefulness, drowsiness and asleep.  Hyperventilation and photic stimulation were performed as activating procedures.  There are minimum muscle and movement artifact noted.  Upon maximum arousal, posterior dominant waking rhythm consistent of low amplitude mildly dysrhythmic alpha range activity. Activities are symmetric over the bilateral posterior derivations and attenuated with eye opening.  Photic stimulation did not alter the tracing.  Hyperventilation produced mild/moderate buildup with higher amplitude and the slower activities noted.  During EEG recording, patient developed drowsiness and e no deeper stage of sleep was achieved  During EEG recording, there was no epileptiform discharge noted.  EKG demonstrate normal sinus rhythm.  CONCLUSION: This is a  normal awake EEG.  There is no electrodiagnostic evidence of epileptiform discharge.  Dorinne Graeff, M.D. Ph.D.  [facility] [address] [place], [place] [address] Phone: [phone] Fax:      [phone]

## 2023-12-06 ENCOUNTER — Encounter (INDEPENDENT_AMBULATORY_CARE_PROVIDER_SITE_OTHER): Payer: Self-pay | Admitting: Family Medicine

## 2023-12-06 ENCOUNTER — Ambulatory Visit (INDEPENDENT_AMBULATORY_CARE_PROVIDER_SITE_OTHER): Payer: PRIVATE HEALTH INSURANCE | Admitting: Family Medicine

## 2023-12-06 VITALS — BP 92/63 | HR 77 | Temp 98.0°F | Ht 67.0 in | Wt 265.0 lb

## 2023-12-06 DIAGNOSIS — Z903 Acquired absence of stomach [part of]: Secondary | ICD-10-CM

## 2023-12-06 DIAGNOSIS — Z6841 Body Mass Index (BMI) 40.0 and over, adult: Secondary | ICD-10-CM

## 2023-12-06 DIAGNOSIS — R7303 Prediabetes: Secondary | ICD-10-CM | POA: Diagnosis not present

## 2023-12-06 NOTE — Assessment & Plan Note (Signed)
 Last A1c of 6.1 in April of 2025.  She has been working on limiting simple carbohydrates

## 2023-12-06 NOTE — Progress Notes (Signed)
 SUBJECTIVE:  Chief Complaint: Obesity  Interim History: Since last appointment she has followed the meal plan 80-90-% and she has been not always getting in a meal when she is working.  She has gotten to know the people at [employer] foods for salads.  She has cut back on her kale intake due to concerns about how it could affect her digestive system.  Patient mentions she is getting in 3/4 quantity in at supper.  Over the next month patient has cheerleading competitions and family vacation.    Nancy Garrett is here to discuss her progress with her obesity treatment plan. She is on the Category 3 Plan and states she is following her eating plan approximately 80-90 % of the time. She states she is  walking exercising 30-45 minutes 5-6 times per week.   OBJECTIVE: Visit Diagnoses: Problem List Items Addressed This Visit       Other   Morbid obesity (HCC)   Anthropometric Measurements Height: 5' 7 (1.702 m) Weight: 265 lb (120.2 kg) BMI (Calculated): 41.5 Weight at Last Visit: 269 lb Weight Lost Since Last Visit: 4 lb Weight Gained Since Last Visit: 0 Starting Weight: 268 lb Total Weight Loss (lbs): 3 lb (1.361 kg) Peak Weight: 308 lb Body Composition  Body Fat %: 49.3 % Fat Mass (lbs): 131 lbs Muscle Mass (lbs): 128 lbs Total Body Water (lbs): 94.4 lbs Visceral Fat Rating : 13 Other Clinical Data RMR: 2131 Fasting: yes Labs: no Today's Visit #: 3 Starting Date: 10/13/23       H/O gastric sleeve   Patient has been learning how to get total daily nutrition in with limitations of size at one time.  She is doing exceptionally well at eating higher protein lower fat and carb foods.  Will continue to follow- needs bariatric labs at next lab draw      Prediabetes   Last A1c of 6.1 in April of 2025.  She has been working on limiting simple carbohydrates      Other Visit Diagnoses       S/P gastric sleeve procedure    -  Primary     BMI 40.0-44.9, adult (HCC)           No  data recorded       12/06/2023   10:00 AM 11/01/2023   10:04 AM 11/01/2023    7:57 AM  Vitals with BMI  Height 5' 7 5' 6 5' 6  Weight 265 lbs 274 lbs 273 lbs  BMI 41.5 44.25 44.08  Systolic 92 108 107  Diastolic 63 70 70  Pulse 77 90 85      ASSESSMENT AND PLAN:  Diet: Nancy Garrett is currently in the action stage of change. As such, her goal is to continue with weight loss efforts and has agreed to the Category 3 Plan.   Exercise:  For substantial health benefits, adults should do at least 150 minutes (2 hours and 30 minutes) a week of moderate-intensity, or 75 minutes (1 hour and 15 minutes) a week of vigorous-intensity aerobic physical activity, or an equivalent combination of moderate- and vigorous-intensity aerobic activity. Aerobic activity should be performed in episodes of at least 10 minutes, and preferably, it should be spread throughout the week.  Behavior Modification:  We discussed the following Behavioral Modification Strategies today: increasing lean protein intake, decreasing simple carbohydrates, increasing vegetables, meal planning and cooking strategies, and planning for success.   Return in about 4 weeks (around 01/03/2024).   She was informed  of the importance of frequent follow up visits to maximize her success with intensive lifestyle modifications for her multiple health conditions.  Attestation Statements:   Reviewed by clinician on day of visit: allergies, medications, problem list, medical history, surgical history, family history, social history, and previous encounter notes.     Nancy Cho, MD

## 2023-12-18 NOTE — Assessment & Plan Note (Signed)
 Anthropometric Measurements Height: 5' 7 (1.702 m) Weight: 265 lb (120.2 kg) BMI (Calculated): 41.5 Weight at Last Visit: 269 lb Weight Lost Since Last Visit: 4 lb Weight Gained Since Last Visit: 0 Starting Weight: 268 lb Total Weight Loss (lbs): 3 lb (1.361 kg) Peak Weight: 308 lb Body Composition  Body Fat %: 49.3 % Fat Mass (lbs): 131 lbs Muscle Mass (lbs): 128 lbs Total Body Water (lbs): 94.4 lbs Visceral Fat Rating : 13 Other Clinical Data RMR: 2131 Fasting: yes Labs: no Today's Visit #: 3 Starting Date: 10/13/23

## 2023-12-18 NOTE — Assessment & Plan Note (Signed)
 Patient has been learning how to get total daily nutrition in with limitations of size at one time.  She is doing exceptionally well at eating higher protein lower fat and carb foods.  Will continue to follow- needs bariatric labs at next lab draw

## 2023-12-20 ENCOUNTER — Encounter

## 2023-12-27 ENCOUNTER — Ambulatory Visit (INDEPENDENT_AMBULATORY_CARE_PROVIDER_SITE_OTHER): Payer: PRIVATE HEALTH INSURANCE | Admitting: Family Medicine

## 2023-12-27 ENCOUNTER — Encounter (INDEPENDENT_AMBULATORY_CARE_PROVIDER_SITE_OTHER): Payer: Self-pay | Admitting: Family Medicine

## 2023-12-27 VITALS — BP 101/68 | HR 67 | Temp 98.4°F | Ht 67.0 in | Wt 268.0 lb

## 2023-12-27 DIAGNOSIS — Z6841 Body Mass Index (BMI) 40.0 and over, adult: Secondary | ICD-10-CM

## 2023-12-27 DIAGNOSIS — D508 Other iron deficiency anemias: Secondary | ICD-10-CM

## 2023-12-27 DIAGNOSIS — R7303 Prediabetes: Secondary | ICD-10-CM

## 2023-12-27 NOTE — Progress Notes (Signed)
 SUBJECTIVE:  Chief Complaint: Obesity  Interim History: Patient mentions that she has been trying to stay consistent with intake even with her traveling to [place], [place], then to [place], [place] and then to [place] .  She has been out of town for at least 15 days.  Diet has been very different than what she was taking in previously.  She did the best she could do.  Now her focus is keeping up with her kids.  She made it thru the [employer] in [place].  She has two family reunions coming up- one in [place] and one in [place].  She is planning on going to these and its nothing outside of the norm.  Nancy Garrett is here to discuss her progress with her obesity treatment plan. She is on the Category 3 Plan and states she is following her eating plan approximately 70 % of the time. She states she is moving more with cheerleading.   OBJECTIVE: Visit Diagnoses: Problem List Items Addressed This Visit       Other   Morbid obesity (HCC)   Prediabetes   Other Visit Diagnoses       Other iron deficiency anemia    -  Primary     BMI 40.0-44.9, adult (HCC)           Vitals Temp: 98.4 F (36.9 C) BP: 101/68 Pulse Rate: 67 SpO2: 100 %   Anthropometric Measurements Height: 5' 7 (1.702 m) Weight: 268 lb (121.6 kg) BMI (Calculated): 41.96 Weight at Last Visit: 265 lb Weight Lost Since Last Visit: 0 Weight Gained Since Last Visit: 3 Starting Weight: 268 lb Total Weight Loss (lbs): 0 lb (0 kg) Peak Weight: 308 lb   Body Composition  Body Fat %: 49.8 % Fat Mass (lbs): 133.8 lbs Muscle Mass (lbs): 128.2 lbs Total Body Water (lbs): 95.6 lbs Visceral Fat Rating : 13   Other Clinical Data Today's Visit #: 4 Starting Date: 10/13/23 Comments: Cat 3     ASSESSMENT AND PLAN: Assessment & Plan Other iron deficiency anemia S/p gastric sleeve and taking prenatal vitamin daily.  Needs repeat labs in 1 month to reassess anemia and efficacy of prenatal. Prediabetes Last A1c of 6.1.   Patient is working on lifestyle changes to limit her intake of simple carbohydrates.  Needs no change in current management Morbid obesity (HCC)  BMI 40.0-44.9, adult (HCC)    Diet: Nancy Garrett is currently in the action stage of change. As such, her goal is to continue with weight loss efforts and has agreed to the Category 3 Plan.   Exercise:  For additional and more extensive health benefits, adults should increase their aerobic physical activity to 300 minutes (5 hours) a week of moderate-intensity, or 150 minutes a week of vigorous-intensity aerobic physical activity, or an equivalent combination of moderate- and vigorous-intensity activity. Additional health benefits are gained by engaging in physical activity beyond this amount.   Behavior Modification:  We discussed the following Behavioral Modification Strategies today: increasing lean protein intake, decreasing simple carbohydrates, increasing vegetables, meal planning and cooking strategies, and travel eating strategies.   Return in about 4 weeks (around 01/24/2024).   She was informed of the importance of frequent follow up visits to maximize her success with intensive lifestyle modifications for her multiple health conditions.  Attestation Statements:   Reviewed by clinician on day of visit: allergies, medications, problem list, medical history, surgical history, family history, social history, and previous encounter notes.     Nancy Cho, MD
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
## 2024-01-09 NOTE — Assessment & Plan Note (Signed)
 Last A1c of 6.1.  Patient is working on lifestyle changes to limit her intake of simple carbohydrates.  Needs no change in current management

## 2024-01-10 ENCOUNTER — Ambulatory Visit: Admitting: Clinical

## 2024-01-10 DIAGNOSIS — Z658 Other specified problems related to psychosocial circumstances: Secondary | ICD-10-CM

## 2024-01-10 DIAGNOSIS — F4322 Adjustment disorder with anxiety: Secondary | ICD-10-CM

## 2024-01-31 ENCOUNTER — Ambulatory Visit (INDEPENDENT_AMBULATORY_CARE_PROVIDER_SITE_OTHER): Admitting: Family Medicine

## 2024-01-31 ENCOUNTER — Telehealth: Payer: Self-pay | Admitting: Family

## 2024-01-31 ENCOUNTER — Encounter (INDEPENDENT_AMBULATORY_CARE_PROVIDER_SITE_OTHER): Payer: Self-pay | Admitting: Family Medicine

## 2024-01-31 VITALS — BP 111/74 | HR 87 | Temp 98.2°F | Ht 67.0 in | Wt 268.0 lb

## 2024-01-31 DIAGNOSIS — R7303 Prediabetes: Secondary | ICD-10-CM | POA: Diagnosis not present

## 2024-01-31 DIAGNOSIS — E559 Vitamin D deficiency, unspecified: Secondary | ICD-10-CM | POA: Diagnosis not present

## 2024-01-31 DIAGNOSIS — D508 Other iron deficiency anemias: Secondary | ICD-10-CM

## 2024-01-31 DIAGNOSIS — Z6841 Body Mass Index (BMI) 40.0 and over, adult: Secondary | ICD-10-CM

## 2024-01-31 MED ORDER — VITAMIN D (ERGOCALCIFEROL) 1.25 MG (50000 UNIT) PO CAPS: 50000.0000 [IU] | ORAL_CAPSULE | ORAL | 0 refills | Status: DC

## 2024-01-31 NOTE — Telephone Encounter (Signed)
 Form filled out and placed in Nancy Garrett's folder to review, finish filling out and sign.   To call patient once completed to pick up.

## 2024-01-31 NOTE — Telephone Encounter (Signed)
 Form completed and letter written and placed in outgoing box. please fax then notify patient to pickup original copy.
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
## 2024-01-31 NOTE — Telephone Encounter (Signed)
 Patient came in and dropped off a form from Bariatric center to be completed. She would like a call back when complete for pick up. Form given to [place] in [place].

## 2024-01-31 NOTE — Telephone Encounter (Signed)
 Patient notified and agreed and will send her mom to pick up.  Paperwork left up front in drawer for pick up  Copy sent to scanning.

## 2024-02-01 NOTE — Assessment & Plan Note (Signed)
 Refill of vitamin D  sent to pharmacy today.  Patient has done well on prescription strength supplementation with no nausea vomiting or muscle weakness mentioned.

## 2024-02-01 NOTE — Assessment & Plan Note (Signed)
 A1c at end of April of 6.1.  Patient is working on dietary modifications and lifestyle changes.  She is possibly interested in a bariatric surgery revision.  Will need repeat A1c and insulin  level in the next 1 to 2 months.

## 2024-02-04 NOTE — BH Specialist Note (Signed)
 Integrated Behavioral Health via Telemedicine Visit  02/07/2024 Zaylee Cornia [id number]  Number of Integrated Behavioral Health Clinician visits: 2- Second Visit  Session Start time: 1100   Session End time: 1149  Total time in minutes: 49   Referring Provider: Harlene Duncans, CNM Patient/Family location: Home [facility] Provider location: [facility]  All persons participating in visit: [name] and [facility] Elizette Shek   Types of Service: Individual psychotherapy and Video visit  I connected with Jamielynn Faye Langlois and/or Janiel Faye Michaelsen's n/a via  Telephone or Video Enabled Telemedicine Application  (Video is Caregility application) and verified that I am speaking with the correct person using two identifiers. Discussed confidentiality: Yes   I discussed the limitations of telemedicine and the availability of in person appointments.  Discussed there is a possibility of technology failure and discussed alternative modes of communication if that failure occurs.  I discussed that engaging in this telemedicine visit, they consent to the provision of behavioral healthcare and the services will be billed under their insurance.  Patient and/or legal guardian expressed understanding and consented to Telemedicine visit: Yes   Presenting Concerns: Patient and/or family reports the following symptoms/concerns: Depressed and mentally drained, partially attributes to weight gain; partially to ongoing life stress.  Duration of problem: Ongoing; Severity of problem: moderate  Patient and/or Family's Strengths/Protective Factors: Social connections, Concrete supports in place (healthy food, safe environments, etc.), and Sense of purpose  Goals Addressed: Patient will:  Reduce symptoms of: anxiety, depression, and stress    Demonstrate ability to: Increase healthy adjustment to current life  circumstances  Progress towards Goals: Ongoing    Interventions: Interventions utilized:  Motivational Interviewing and Supportive Reflection Standardized Assessments completed: Not Needed    Patient and/or Family Response: Patient agrees with treatment plan.   Clinical Assessment/Diagnosis  Adjustment disorder with mixed anxiety and depressed mood .   Patient may benefit from continued therapeutic intervention  .  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 They were provided an opportunity to ask questions and all were answered. They agreed with the plan and demonstrated an understanding of the instructions.   They were advised to call back or seek an in-person evaluation if the symptoms worsen or if the condition fails to improve as anticipated.  Warren JAYSON Mering, LCSW     11/01/2023   10:03 AM 10/13/2023    7:37 AM 12/01/2022    9:42 AM 08/19/2022   11:15 AM 06/17/2022    9:27 AM  Depression screen PHQ 2/9  Decreased Interest 0 0 0 0 0  Down, Depressed, Hopeless 0 0 0 0 0  PHQ - 2 Score 0 0 0 0 0  Altered sleeping  1 0 0 1  Tired, decreased energy  1 0 2 0  Change in  appetite  0 0 0 0  Feeling bad or failure about yourself   0 0 0 0  Trouble concentrating  0 0 0 0  Moving slowly or fidgety/restless  0 0 0 0  Suicidal thoughts   0 0 0  PHQ-9 Score  2 0 2 1      12/01/2022    9:40 AM 08/19/2022   11:15 AM 06/17/2022    9:27 AM 06/16/2022    9:56 AM  GAD 7 : Generalized Anxiety Score  Nervous, Anxious, on Edge 1 0 1 1  Control/stop worrying 1 0 0 2  Worry too much - different things 1 0 0 0  Trouble relaxing 0 0 1 1  Restless 0 0 0 0  Easily annoyed or irritable 1 1 1 1   Afraid - awful might happen 1 0 0 1  Total GAD 7 Score 5 1 3  6

## 2024-02-07 ENCOUNTER — Ambulatory Visit: Admitting: Clinical

## 2024-02-07 DIAGNOSIS — F4323 Adjustment disorder with mixed anxiety and depressed mood: Secondary | ICD-10-CM | POA: Diagnosis not present

## 2024-02-07 NOTE — Patient Instructions (Signed)
 Center for [facility] at [facility] [address] [place], [place] [address] [phone] (main office) [phone] (Adalina Dopson's office)

## 2024-02-21 ENCOUNTER — Encounter (INDEPENDENT_AMBULATORY_CARE_PROVIDER_SITE_OTHER): Payer: Self-pay | Admitting: Family Medicine

## 2024-02-21 ENCOUNTER — Ambulatory Visit (INDEPENDENT_AMBULATORY_CARE_PROVIDER_SITE_OTHER): Admitting: Family Medicine

## 2024-02-21 VITALS — BP 99/67 | HR 85 | Temp 97.5°F | Ht 67.0 in | Wt 269.0 lb

## 2024-02-21 DIAGNOSIS — E7849 Other hyperlipidemia: Secondary | ICD-10-CM

## 2024-02-21 DIAGNOSIS — Z903 Acquired absence of stomach [part of]: Secondary | ICD-10-CM | POA: Diagnosis not present

## 2024-02-21 DIAGNOSIS — Z6841 Body Mass Index (BMI) 40.0 and over, adult: Secondary | ICD-10-CM

## 2024-02-21 NOTE — Progress Notes (Unsigned)
 SUBJECTIVE:  Chief Complaint: Obesity  Interim History: since last appointment patient got injured and hurt her foot and ankle.  Her flat iron burned her inner ankle and then she fell off her ladder and landed on her daughters Psychiatrist and made a hole in the bottom of her foot.  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  She is dealing with constipation currently and mentions this is back to her norm of going around 1x a week.  She has a follow up with surgeon at [facility] to discuss what surgery her insurance may cover.  She has been doing premade meals from [employer] and that has made eating nutritiously more convenient.  She is trying to eat a salad everyday.  She struggles with finding a protein shake that doesn't make her feel like she is going to vomit.  She has been on plan about 85-95%.  Is experiencing headaches if doesn't have a sweet tea daily- thinks it is attributed to caffeine.  Thinks she is getting at best 2/3 of protein intake in at supper.  Nancy Garrett is here to discuss her progress with her obesity treatment plan. She is on the Category 3 Plan and states she is following her eating plan approximately 85-90 % of the time. She states she is exercising 120 minutes 4 times per week.   OBJECTIVE: Visit Diagnoses: Problem List Items Addressed This Visit       Other   Morbid obesity (HCC)   H/O gastric sleeve - Primary   Hyperlipidemia   Other Visit Diagnoses       BMI 40.0-44.9, adult (HCC)           Vitals Temp: (!) 97.5 F (36.4 C) BP: 99/67 Pulse Rate: 85 SpO2: 100 %   Anthropometric Measurements Height: 5' 7 (1.702 m) Weight: 269 lb (122 kg) BMI (Calculated): 42.12 Weight at Last Visit: 268 lb Weight Lost Since Last Visit: 0 Weight Gained Since Last Visit: 1 Starting Weight: 268 lb Total Weight Loss (lbs): 0 lb (0 kg)   Body Composition  Body Fat %: 47 % Fat Mass (lbs): 126.4 lbs Muscle Mass (lbs):  135.4 lbs Total Body Water (lbs): 100.4 lbs Visceral Fat Rating : 13   Other Clinical Data Today's Visit #: 6 Starting Date: 10/13/23 Comments: Cat 3     ASSESSMENT AND PLAN: Assessment & Plan H/O gastric sleeve Patient is planning for an upcoming appointment with [place] surgery to discuss revision of her bariatric surgery.  She is unclear which surgery she will opt for what will be best for her especially given her significant constipation.  Will follow-up on plans for surgery at next appointment.  Until that time patient will continue on current meal plan as outlined below. Other hyperlipidemia Previous cholesterol is elevated with an LDL at 168 and HDL at 61 and a triglyceride at 71.  Patient has been working on dietary and lifestyle modifications which include limiting her saturated fat intake and increasing her physical activity.  Will need repeat labs at October 2025 which will be 6 months and starting program to reevaluate cholesterols. Morbid obesity (HCC)  BMI 40.0-44.9, adult (HCC)    Diet: Nancy Garrett is currently in the action stage of change. As such, her goal is to continue with weight loss efforts and has agreed to the Category 3 Plan.   Exercise:  For substantial health benefits, adults should do at least 150 minutes (2 hours and 30 minutes)  a week of moderate-intensity, or 75 minutes (1 hour and 15 minutes) a week of vigorous-intensity aerobic physical activity, or an equivalent combination of moderate- and vigorous-intensity aerobic activity. Aerobic activity should be performed in episodes of at least 10 minutes, and preferably, it should be spread throughout the week.  Behavior Modification:  We discussed the following Behavioral Modification Strategies today: increasing lean protein intake, decreasing simple carbohydrates, increasing vegetables, meal planning and cooking strategies, and planning for success.   Return in about 6 weeks (around 04/03/2024) for  fasting labs.   She was informed of the importance of frequent follow up visits to maximize her success with intensive lifestyle modifications for her multiple health conditions.  Attestation Statements:   Reviewed by clinician on day of visit: allergies, medications, problem list, medical history, surgical history, family history, social history, and previous encounter notes.   Nancy Cho, MD

## 2024-02-22 NOTE — Assessment & Plan Note (Signed)
 Previous cholesterol is elevated with an LDL at 168 and HDL at 61 and a triglyceride at 71.  Patient has been working on dietary and lifestyle modifications which include limiting her saturated fat intake and increasing her physical activity.  Will need repeat labs at October 2025 which will be 6 months and starting program to reevaluate cholesterols.

## 2024-02-22 NOTE — Assessment & Plan Note (Signed)
 Patient is planning for an upcoming appointment with [place] surgery to discuss revision of her bariatric surgery.  She is unclear which surgery she will opt for what will be best for her especially given her significant constipation.  Will follow-up on plans for surgery at next appointment.  Until that time patient will continue on current meal plan as outlined below.

## 2024-02-25 NOTE — BH Specialist Note (Signed)
 Integrated Behavioral Health via Telemedicine Visit  02/28/2024 Teryl Gubler [id number]  Number of Integrated Behavioral Health Clinician visits: 3- Third Visit  Session Start time: 1051   Session End time: 1147  Total time in minutes: 56  Referring Provider: Glenys Birk, MD Patient/Family location: Home [facility] Provider location: [facility] for [facility]  All persons participating in visit: [name] and [facility] Kirstine Jacquin   Types of Service: Individual psychotherapy and Video visit  I connected with Jeraldin Faye Hagerman and/or Uri Faye Fitzpatrick's n/a via  Telephone or Video Enabled Telemedicine Application  (Video is Caregility application) and verified that I am speaking with the correct person using two identifiers. Discussed confidentiality: Yes   I discussed the limitations of telemedicine and the availability of in person appointments.  Discussed there is a possibility of technology failure and discussed alternative modes of communication if that failure occurs.  I discussed that engaging in this telemedicine visit, they consent to the provision of behavioral healthcare and the services will be billed under their insurance.  Patient and/or legal guardian expressed understanding and consented to Telemedicine visit: Yes   Presenting Concerns: Patient and/or family reports the following symptoms/concerns: Requesting referral to psychiatry, as requirement prior to revision to previous weight loss surgery; feeling sadness, disappointment and anxiety regarding husband forgetting wedding anniversary and his recent emotional outbursts. Duration of problem: Ongoing with recent changes; Severity of problem: moderate  Patient and/or Family's Strengths/Protective Factors: Social connections, Concrete supports in place (healthy food, safe environments, etc.), and Sense of purpose  Goals Addressed: Patient will:  Reduce  symptoms of: anxiety, depression, and stress    Demonstrate ability to: Increase healthy adjustment to current life circumstances  Progress towards Goals: Ongoing    Interventions: Interventions utilized:  Supportive Reflection Standardized Assessments completed: Not Needed  Patient and/or Family Response: Patient agrees with treatment plan.   Clinical Assessment/Diagnosis  Adjustment disorder with mixed anxiety and depressed mood - Plan: Ambulatory referral to Behavioral Health  Psychosocial stressors - Plan: Ambulatory referral to Behavioral Health   Patient may benefit from continued therapeutic intervention  .  Plan: Follow up with behavioral health clinician on : Two weeks Behavioral recommendations:  -Accept referral to psychiatry -Continue plan to follow medical recommendations (neurology; weight loss clinic/program; gallstone removal) -Continue as cheer team assistant coach; prioritizing well being for self and children -Schedule next couple counseling appointment  Referral(s): Integrated [employer] (In Clinic)  I discussed the assessment and treatment plan with the patient and/or parent/guardian. They were provided an opportunity to ask questions and all were answered. They agreed with the plan and demonstrated an understanding of the instructions.   They were advised to call back or seek an in-person evaluation if the symptoms worsen or if the condition fails to improve as anticipated.  Warren JAYSON Mering, LCSW     11/01/2023   10:03 AM 10/13/2023    7:37 AM 12/01/2022    9:42 AM 08/19/2022   11:15 AM 06/17/2022    9:27 AM  Depression screen PHQ 2/9  Decreased Interest 0 0 0 0 0  Down, Depressed, Hopeless 0 0 0 0 0  PHQ - 2 Score 0 0 0 0 0  Altered sleeping  1 0 0 1  Tired, decreased energy  1 0 2 0  Change in appetite  0 0 0 0  Feeling bad or failure about yourself   0 0 0 0  Trouble concentrating  0 0 0 0  Moving slowly or fidgety/restless  0  0 0 0  Suicidal thoughts   0 0 0  PHQ-9 Score  2 0 2 1      12/01/2022    9:40 AM 08/19/2022   11:15 AM 06/17/2022    9:27 AM 06/16/2022    9:56 AM  GAD 7 : Generalized Anxiety Score  Nervous, Anxious, on Edge 1 0 1 1  Control/stop worrying 1 0 0 2  Worry too much - different things 1 0 0 0  Trouble relaxing 0 0 1 1  Restless 0 0 0 0  Easily annoyed or irritable 1 1 1 1   Afraid - awful might happen 1 0 0 1  Total GAD 7 Score 5 1 3  6

## 2024-02-26 ENCOUNTER — Ambulatory Visit: Payer: Self-pay | Admitting: Surgery

## 2024-02-27 IMAGING — CR DG LUMBAR SPINE COMPLETE 4+V
5 series · 5 of 5 positions shown · non-contrast
Comparison: None.

CLINICAL DATA: Midline lower back pain.

EXAM:
LUMBAR SPINE - COMPLETE 4+ VIEW

[w lumbar spine ap]
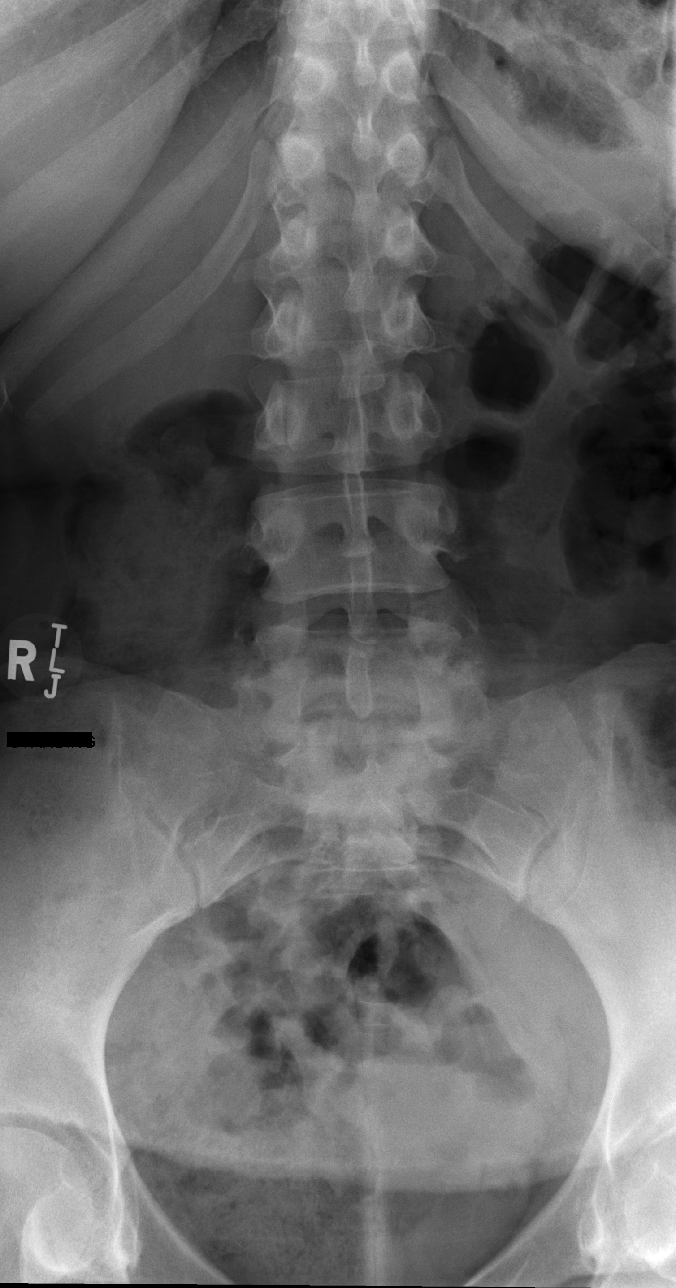

[w lumbar spine obl (1 of 2)]
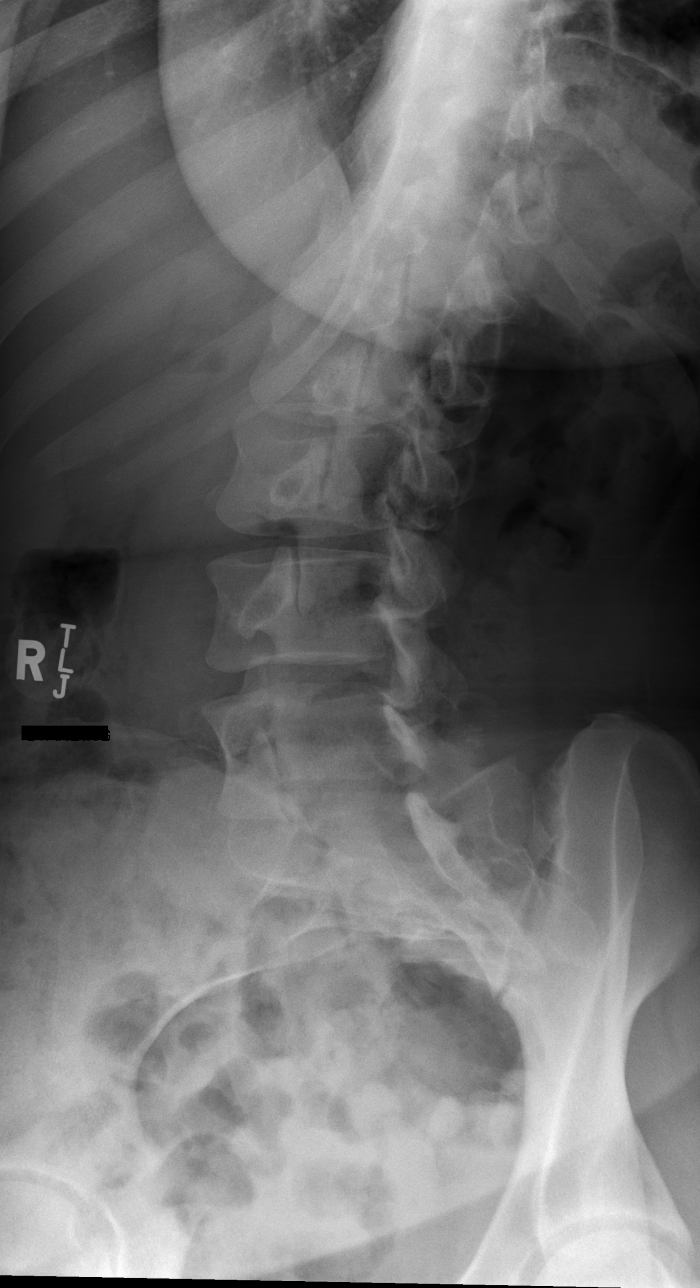

[w lumbar spine obl (2 of 2)]
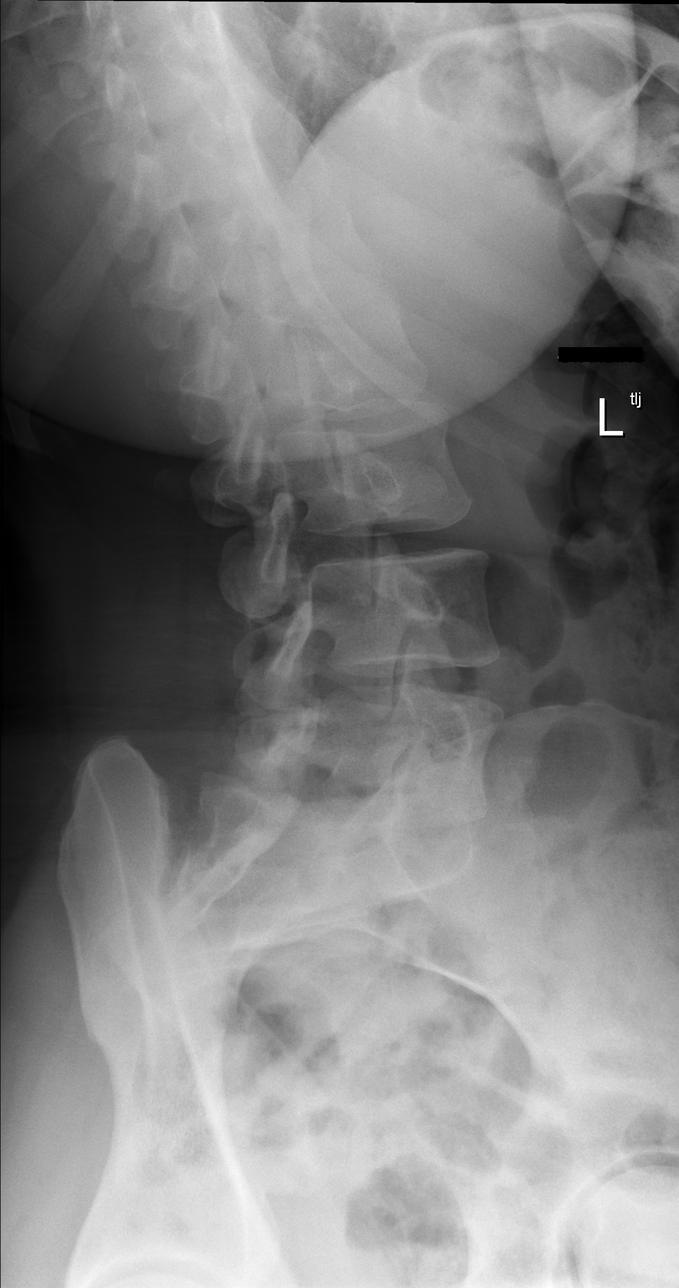

[w lumbar spine lat]
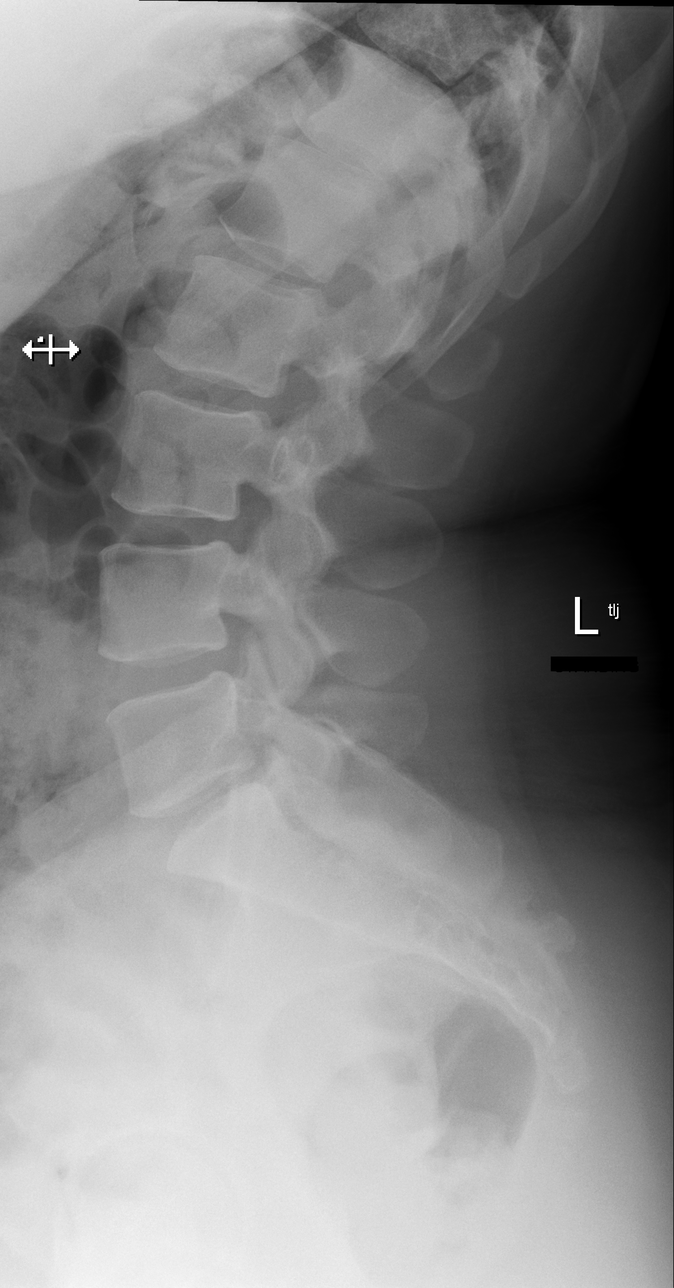

[w lumbar l-5 s-1 spot]
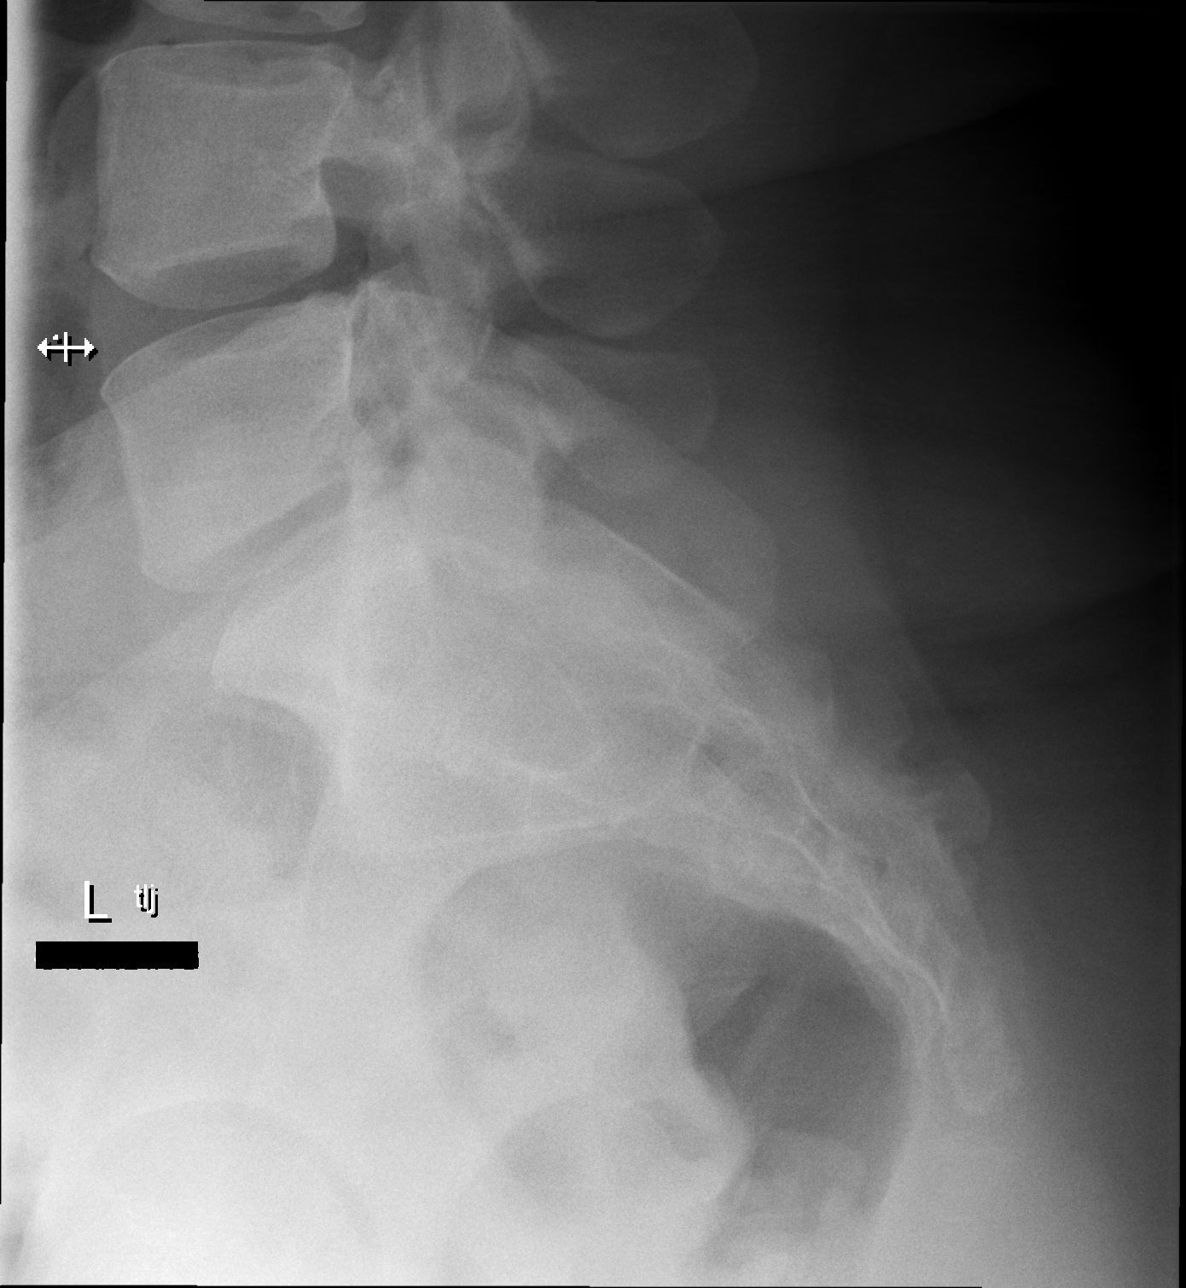

[5 of 5 positions shown; findings below may reference images not displayed]

FINDINGS: There is no evidence of lumbar spine fracture. Alignment is normal.
Very mild intervertebral disc space narrowing is seen at the level
of L5-S1.
IMPRESSION: Very mild degenerative changes at L5-S1.

## 2024-02-28 ENCOUNTER — Encounter (INDEPENDENT_AMBULATORY_CARE_PROVIDER_SITE_OTHER): Payer: Self-pay

## 2024-02-28 ENCOUNTER — Ambulatory Visit: Admitting: Neurology

## 2024-02-28 ENCOUNTER — Telehealth (INDEPENDENT_AMBULATORY_CARE_PROVIDER_SITE_OTHER): Payer: Self-pay | Admitting: Family Medicine

## 2024-02-28 ENCOUNTER — Other Ambulatory Visit (HOSPITAL_COMMUNITY): Payer: Self-pay | Admitting: Surgery

## 2024-02-28 ENCOUNTER — Ambulatory Visit (INDEPENDENT_AMBULATORY_CARE_PROVIDER_SITE_OTHER): Admitting: Clinical

## 2024-02-28 VITALS — BP 116/76 | HR 90 | Ht 66.0 in | Wt 270.0 lb

## 2024-02-28 DIAGNOSIS — F4323 Adjustment disorder with mixed anxiety and depressed mood: Secondary | ICD-10-CM | POA: Diagnosis not present

## 2024-02-28 DIAGNOSIS — G43709 Chronic migraine without aura, not intractable, without status migrainosus: Secondary | ICD-10-CM | POA: Diagnosis not present

## 2024-02-28 DIAGNOSIS — Z658 Other specified problems related to psychosocial circumstances: Secondary | ICD-10-CM

## 2024-02-28 DIAGNOSIS — R55 Syncope and collapse: Secondary | ICD-10-CM

## 2024-02-28 DIAGNOSIS — R519 Headache, unspecified: Secondary | ICD-10-CM

## 2024-02-28 DIAGNOSIS — R9389 Abnormal findings on diagnostic imaging of other specified body structures: Secondary | ICD-10-CM

## 2024-02-28 DIAGNOSIS — Z903 Acquired absence of stomach [part of]: Secondary | ICD-10-CM

## 2024-02-28 MED ORDER — RIZATRIPTAN BENZOATE 5 MG PO TBDP: 5.0000 mg | ORAL_TABLET | ORAL | 6 refills | Status: DC | PRN

## 2024-02-28 MED ORDER — DULOXETINE HCL 30 MG PO CPEP
30.0000 mg | ORAL_CAPSULE | Freq: Every day | ORAL | 11 refills | Status: DC
Start: 2024-02-28 — End: 2024-04-03

## 2024-02-28 NOTE — Patient Instructions (Signed)

## 2024-02-28 NOTE — Telephone Encounter (Signed)
 Good afternoon!  [employer] insurance is claiming that she need 3 month of a medical weight management program in order to qualify for bariatric surgery, Her surgeon has placed a referral to [facility] at [place] and they insist that our program is not viable. She has been seeing us  for 6 months.   She would a call to talk through what can be done to make sure that her appointments here can qualify.  Thanks!

## 2024-02-28 NOTE — Progress Notes (Unsigned)
 Chief Complaint  Patient presents with   Follow-up    RM 15 migraines with mom and daughters       ASSESSMENT AND PLAN  Nancy Garrett is a 33 y.o. female    Chronic migraines Daily headaches Obesity  Was seen by ophthalmologist Dr. 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  ADDENDUM 12/22/2023 JM: Dr. Octavia evaluation 12/16/2023: No ocular etiology for headache noted, no evidence of IIH MEDICAL HISTORY:  Update 10/28/2023 JM: Patient returns for follow-up visit accompanied by her 2 daughters. Reports continued daily frontal headaches which can worsen in severity and can be debilitating. Denies routine use of OTC pain relievers.  She was never contacted by ophthalmology to schedule visit and she was not sure where referral was sent to call to schedule. She was having issues with  blurred vision but this improved after updating glasses prescription.  Denies tinnitus.  Does endorse weight gain since she delivered her daughter over 1 year ago despite efforts at weight loss, she is currently working with healthy weight and wellness.  She has since stopped breast-feeding.  She was previously seen by cardiology for episodes of palpitations and syncope which was occurring frequently when she was pregnant. Reports sensation of feeling warm over her entire body, headache will worsen and then will lose consciousness. Only lasts for a few seconds. No seizure like activity, no postictal symptoms after.  Cardiology recommended repeat cardiac monitor which was overall benign, declined interest in pursuing ILR.  Reports last syncopal event was last Saturday, episode sporadic and without clear cause/trigger, can occur 1-2 times per week and then not reoccur for 2-3 weeks.   She does report that her daughter 6667 mo old) was hospitalized for 5 days back in April after she had a syncopal event, after extensive workup no clear cause found and felt possibly related to recent viral illness although patient concerned as she was not recently sick. She is concerned that she possibly passed something down to her.      Consult visit 04/15/2023 Dr. Onita: Nancy Garrett, is a 33 year old female seen in request by her primary care nurse practitioner Ngetich, Dinah for evaluation of migraine headache initial evaluation April 15, 2023  History is obtained from the patient and review of electronic medical records. I personally reviewed pertinent available imaging films in PACS.   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  She had  long history of chronic migraine, increased migraine headache during her first pregnancy in 2017, could not function well, to the point have bed resting, her headache improved postpartum  Began to have frequent headaches again during her recent pregnancy in November  2023, has been persistent despite delivery in April 2024  3 to 4 days out of the week she would have moderate headaches, with weather changes in September 2024 she began to have daily pressure headaches, multiple times a day, pressure, bilateral frontal headache, with some light noise sensitivity, tried over-the-counter medication with limited help  She denies significant visual change, had a gastric bypass surgery in the past, with significant weight loss with some weight gain again with her second pregnancy  UPDATE Sept 15 2025:  She was seen by Dr. Bryn in 2025, was normal. She still has headaches, 2-4 times a day, 5-10 minutes, sometimes linger longer, she still take Topamax  25mg  bid, no benefit or side effect noticed, it   She take ibuprofen  800mg  daily for her body achy pain,   During headaches, more predganncy, she could nto avoid it, staning up,   Most ime, getting hot, she is getig nhto, goes down, second, both, walkign around, go sitting down, 1-2 d uring weekly,  Revision for weigh tloss, sleev, not bypass,   PHYSICAL EXAM:   Vitals:   02/28/24 0906  BP: 116/76  Pulse: 90  SpO2: 98%  Weight: 270 lb (122.5 kg)  Height: 5' 6 (1.676 m)   Body mass index is 43.58 kg/m.  PHYSICAL EXAMNIATION:  Gen: NAD, conversant, well nourised, well groomed                     Cardiovascular: Regular rate rhythm, no peripheral edema, warm, nontender. Eyes: Conjunctivae clear without exudates or hemorrhage Neck: Supple, no carotid bruits. Pulmonary: Clear to auscultation bilaterally   NEUROLOGICAL EXAM:  MENTAL STATUS: Speech/cognition: Awake, alert, oriented to history taking and casual conversation CRANIAL NERVES: CN II: Visual fields are full to confrontation.  CN III, IV, VI: extraocular movement are normal. No ptosis. CN V: Facial sensation is intact to light touch CN VII: Face is symmetric with normal eye closure  CN VIII: Hearing is normal to causal conversation. CN  IX, X: Phonation is normal. CN XI: Head turning and shoulder shrug are intact  MOTOR: There is no pronator drift of out-stretched arms. Muscle bulk and tone are normal. Muscle strength is normal.  REFLEXES: Reflexes are 2+ and symmetric at the biceps, triceps, knees, and ankles. Plantar responses are flexor.  SENSORY: Intact to light touch, pinprick and vibratory sensation are intact in fingers and toes.  COORDINATION: There is no trunk or limb dysmetria noted.  GAIT/STANCE: Posture is normal. Gait is steady     REVIEW OF SYSTEMS:  Full 14 system review of systems performed and notable only for as above All other review of systems were negative.   ALLERGIES: Allergies  Allergen Reactions   Gadolinium Hives, Itching and Nausea And Vomiting    Pt reports she has allergy to MRI contrast not CT contrast. AV, RN 11/24/22   Iodinated Contrast Media    Ivp Dye [Iodinated Contrast Media] Nausea And Vomiting    HOME MEDICATIONS: Current Outpatient Medications  Medication Sig Dispense Refill   ibuprofen  (ADVIL ) 800 MG tablet Take 1 tablet (800 mg total) by mouth every 8 (eight) hours as needed. 60 tablet 1   Prenatal Vit-Fe Fumarate-FA (PRENATAL MULTIVITAMIN) TABS tablet Take 1 tablet by mouth  daily at 12 noon.     terbinafine  (LAMISIL ) 1 % cream APPLY TO THE AFFECTED AREAS TWICE DAILY 30 g 3   topiramate  (TOPAMAX ) 25 MG tablet Take 1 tablet (25 mg total) by mouth 2 (two) times daily. 120 tablet 3   Vitamin D , Ergocalciferol , (DRISDOL ) 1.25 MG (50000 UNIT) CAPS capsule Take 1 capsule (50,000 Units total) by mouth every 7 (seven) days. 12 capsule 0   Current Facility-Administered Medications  Medication Dose Route Frequency Provider Last Rate Last Admin   terbinafine  (LAMISIL ) 1 % cream   Topical Daily         PAST MEDICAL HISTORY: Past Medical History:  Diagnosis Date   Anemia    Hx   Anxiety    Back pain    Chronic abdominal pain    Constipation    Head ache    Hip  pain    Prediabetes    Syncope    cardiology and neurology evaluations ~ 03/2022   Tumor    Mass tumor on brain    PAST SURGICAL HISTORY: Past Surgical History:  Procedure Laterality Date   DILITATION & CURRETTAGE/HYSTROSCOPY WITH HYDROTHERMAL ABLATION N/A 04/06/2023   Procedure: DILATATION & CURETTAGE/HYSTEROSCOPY WITH HYDROTHERMAL ABLATION;  Surgeon: Fredirick Glenys RAMAN, MD;  Location: [facility] OR;  Service: Gynecology;  Laterality: N/A;   LAPAROSCOPIC BILATERAL SALPINGECTOMY Bilateral 04/06/2023   Procedure: LAPAROSCOPIC BILATERAL SALPINGECTOMY;  Surgeon: Fredirick Glenys RAMAN, MD;  Location[facility];  Service: Gynecology;  Laterality: Bilateral;   LAPAROSCOPIC GASTRIC SLEEVE RESECTION  08/24/2017   Dr. Unice Carpen. Done in [place]   TONSILLECTOMY     As a child   TUBAL LIGATION  03/2023    FAMILY HISTORY: Family History  Problem Relation Age of Onset   Hypertension Mother    High blood pressure Mother    Diabetes Father    Diabetes Sister    Stroke Sister    High blood pressure Sister     SOCIAL HISTORY: Social History   Socioeconomic History   Marital status: Married    Spouse name: Not on file   Number of children: Not on file   Years of education: Not on file   Highest education level: Associate degree: academic program  Occupational History   Occupation: Cosmetologist  Tobacco Use   Smoking status: Never   Smokeless tobacco: Never  Vaping Use   Vaping status: Never Used  Substance and Sexual Activity   Alcohol use: Not Currently    Comment: Occasionally   Drug use: Never   Sexual activity: Yes  Other Topics Concern   Not on file  Social History Narrative   ** Merged History Encounter **       Tobacco use, amount per day now: Past tobacco use, amount per day: How many years did you use tobacco: Alcohol use (drinks per week): Diet: Do you drink/eat things with caffeine: No Marital status:   Married                               What year    were you married?  2022 Do you live in a house, apartment, assisted living, condo, trailer, etc.? House Is it one or more stories? One How many persons live in your home? 3  Do you have pets in your home?( please list) No Highest Level of education c   ompleted? College Current or past profession: Associate Professor, Salon Owner. Do you exercise? No  Type and how often? Do you have a living will? Yes Do you have a DNR form?   No                               If not, do you    want to discuss one? Do you have signed POA/HPOA forms?    No                    If so, please bring to you appointment  Do you have any difficulty bathing or dressing yourself? Yes Do you have any difficulty preparing food or eating? No Do you hav   e any difficulty managing your medications? No Do you have any difficulty managing your finances? No Do you have any difficulty affording your medications? No   Social Drivers of Corporate investment banker Strain: Low Risk  (10/30/2023)   Overall Financial Resource Strain (CARDIA)    Difficulty of Paying Living Expenses: Not very hard  Food Insecurity: No Food Insecurity (10/30/2023)   Hunger Vital Sign    Worried About Running Out of Food in the Last Year: Never true    Ran Out of Food in the Last Year: Never true  Transportation Needs: No Transportation Needs (10/30/2023)   PRAPARE - Administrator, Civil Service (Medical): No    Lack of Transportation (Non-Medical): No  Physical Activity: Unknown (10/30/2023)   Exercise Vital Sign    Days of Exercise per Week: 0 days    Minutes of Exercise per Session: Not on file  Stress: No Stress Concern Present (10/30/2023)   [employer] of Occupational Health - Occupational Stress Questionnaire    Feeling of Stress : Not at all  Social Connections: Moderately Integrated (10/30/2023)   Social Connection and Isolation Panel    Frequency of Communication with Friends and Family: More than three  times a week    Frequency of Social Gatherings with Friends and Family: Once a week    Attends Religious Services: 1 to 4 times per year    Active Member of [employer] or Organizations: No    Attends Banker Meetings: Not on file    Marital Status: Married  Catering manager Violence: Not At Risk (09/18/2022)   Humiliation, Afraid, Rape, and Kick questionnaire    Fear of Current or Ex-Partner: No    Emotionally Abused: No    Physically Abused: No    Sexually Abused: No   Modena Callander. M.D. Ph.D.

## 2024-03-02 ENCOUNTER — Encounter: Payer: Self-pay | Admitting: Dietician

## 2024-03-02 ENCOUNTER — Encounter: Attending: Surgery | Admitting: Dietician

## 2024-03-02 VITALS — Ht 66.0 in | Wt 270.2 lb

## 2024-03-02 DIAGNOSIS — Z903 Acquired absence of stomach [part of]: Secondary | ICD-10-CM | POA: Diagnosis present

## 2024-03-02 DIAGNOSIS — E669 Obesity, unspecified: Secondary | ICD-10-CM | POA: Insufficient documentation

## 2024-03-02 NOTE — Progress Notes (Signed)
 Nutrition Assessment for Bariatric Surgery: Pre-Surgery Behavioral and Nutrition Intervention Program   Medical Nutrition Therapy  Appt Start Time: 9044    End Time: 1110  Patient was seen on 03/02/2024 for Pre-Operative Nutrition Assessment. Purpose of todays visit  enhance perioperative outcomes along with a healthy weight maintenance   Referral stated Supervised Weight Loss (SWL) visits needed: 0  Not cleared at this time:  Pt to follow up for minimum of one more visit to assist pt with progressing through stages of change/further nutrition education. RD advised pt that this follow up visit is not mandated through insurance. Pt verbalized agreement.  Planned surgery: Conversion from Sleeve Gastrectomy to SADI-S Pt expectation of surgery: to be more healthy; healthier weight; relief from back pain  NUTRITION ASSESSMENT   Anthropometrics  Start weight at [facility]: 270.2 lbs (date: 03/02/2024)  Height: 66 in BMI: 43.61 kg/m2     Clinical   Medical hx: sleeve gastrectomy, anxiety, tumor Medications: DULoxetine  (CYMBALTA ) 30 MG capsule ibuprofen  (ADVIL ) 800 MG tablet Prenatal Vit-Fe Fumarate-FA (PRENATAL MULTIVITAMIN) TABS tablet rizatriptan  (MAXALT -MLT) 5 MG disintegrating tablet terbinafine  (LAMISIL ) 1 % cream topiramate  (TOPAMAX ) 25 MG tablet Vitamin D , Ergocalciferol , (DRISDOL ) 1.25 MG (50000 UNIT) CAPS capsule Labs: Vit D 15.2 Notable signs/symptoms: none noted Any previous deficiencies? 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  Pt states she has done many different weight loss diets and medications. Pt states she  had her sleeve surgery in [place].  Pt states she deals with constipation, stating one time it was 2 months before she had a bowel movement. Pt states she can change her diet to fruits and vegetables and still will have issues with constipation. Pt states she can't eat a full meal and stand over someone to do hair. Pt states she does not think she gets enough protein. Pt states she gets headaches, due to tumor, stating she does not get headaches when drinking tea with caffeine.  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), with 50% recommendation reached   Sleep Hygiene: duration and quality: fall asleep well, but does not stay asleep  Current Patient Perceived Stress Level as stated by pt on a scale of 1-10:  5  (panic/anxiety)     Stress Management Techniques: compression equipment, let it pass  According to the Dietary Guidelines for Americans Recommendation: equivalent 1.5-2 cups fruits per day, equivalent 2-3 cups vegetables per day and at least half all grains whole  Fruit servings per day (on average): 1-2, meeting 50% recommendation  Non-starchy vegetable servings per day (on average): 1-2, meeting 50% recommendation  Whole Grains per day (on average): 0-1  Number of meals missed/skipped per week out of 21: 6  24-Hr Dietary Recall First Meal: 9 am skip or banana or bacon or grits or eggs Snack:  Second Meal: after 12 (sometimes close to 5 pm) easy dinner from [employer] (chicken or meatballs, with salad or vegetable) Snack: puff popcorn or sunflower seed grain  in a rolled up ball with fruit or jerky or nuts or greek yogurt Third Meal: 8-9:30 pm baked chicken or BBQ and vinegar cole slaw Snack:  Beverages: sweet tea,  water, water with flavorings, juice  Alcoholic beverages per week: 0-1 (one every two to three weeks)   Estimated Energy Needs Calories: 1500  NUTRITION DIAGNOSIS  Overweight/obesity (Ivy-3.3) related to past poor dietary habits and physical inactivity as evidenced by patient w/ planned conversion to SADI-S surgery following dietary guidelines for continued weight loss.  NUTRITION INTERVENTION  Nutrition counseling (C-1) and education (E-2) to facilitate bariatric surgery goals.  Educated pt on micronutrient deficiencies post-surgery and behavioral/dietary strategies to start in order to mitigate that risk   Behavioral and Dietary Interventions Pre-Op Goals Reviewed with the Patient Nutrition: Healthy Eating Behaviors Switch to non-caloric, non-carbonated and non-caffeinated beverages such as  water, unsweetened tea, Crystal Light and zero calorie beverages (aim for 64 oz. per day) Cut out grazing between meals or at night  Find a protein shake you like Eat every 3-5 hours        Eliminate distractions while eating (TV, computer, reading, driving, texting) Take 79-69 minutes to eat a meal  Decrease high sugar foods/decrease high fat/fried foods Eliminate alcoholic beverages Increase protein intake (eggs, fish, chicken, yogurt) before surgery; track protein; aim for 80-100 grams per day Eat non starchy vegetables 2 times a day 7 days a week Eat complex carbohydrates such as whole grains and fruits   Behavioral Modification: Physical Activity Increase my usual daily activity (use stairs, park farther, etc.) Engage in _______________________  activity  _______ minutes ______ times per week  Other:    _________________________________________________________________     Problem Solving I will think about my usual eating patterns and how to tweak them How can my friends and family support me Barriers to starting my changes Learn and understand appetite verses hunger   Healthy  Coping Allow for ___________ activities per week to help me manage stress Reframe negative thoughts I will keep a picture of someone or something that is my inspiration & look at it daily   Monitoring  Weigh myself once a week  Measure my progress by monitoring how my clothes fit Keep a food record of what I eat and drink for the next ________ (time period) Take pictures of what I eat and drink for the next ________ (time period) Use an app to count steps/day for the next_______ (time period) Measure my progress such as increased energy and more restful sleep Monitor your acid reflux and bowel habits, are they getting better?   *Goals that are bolded indicate the pt would like to start working towards these  Handouts Provided Include  Bariatric Surgery handouts (Nutrition Visits, Pre Surgery Behavioral Change Goals, Protein Shakes Brands to Choose From, Vitamins & Mineral Supplementation)  Learning Style & Readiness for Change Teaching method utilized: Visual, Auditory, and hands on  Demonstrated degree of understanding via: Teach Back  Readiness Level: preparation Barriers to learning/adherence to lifestyle change: affinity for sweet tea  RD's Notes for Next Visit Patient progress toward chosen goals   MONITORING & EVALUATION Dietary intake, weekly physical activity, body weight, and preoperative behavioral change goals   Next Steps  Pt to follow up for minimum of one more visit to assist pt with progressing through stages of change/further nutrition education.

## 2024-03-06 ENCOUNTER — Ambulatory Visit (HOSPITAL_COMMUNITY)
Admission: RE | Admit: 2024-03-06 | Discharge: 2024-03-06 | Disposition: A | Discharge status: Home / Self Care | Source: Ambulatory Visit | Attending: Surgery | Admitting: Surgery

## 2024-03-06 ENCOUNTER — Other Ambulatory Visit: Payer: Self-pay

## 2024-03-06 ENCOUNTER — Encounter (HOSPITAL_COMMUNITY)
Admission: RE | Admit: 2024-03-06 | Discharge: 2024-03-06 | Disposition: A | Discharge status: Home / Self Care | Source: Ambulatory Visit | Attending: Surgery | Admitting: Surgery

## 2024-03-06 DIAGNOSIS — Z903 Acquired absence of stomach [part of]: Secondary | ICD-10-CM | POA: Diagnosis present

## 2024-03-06 DIAGNOSIS — Z01818 Encounter for other preprocedural examination: Secondary | ICD-10-CM | POA: Insufficient documentation

## 2024-03-20 ENCOUNTER — Ambulatory Visit: Admitting: Clinical

## 2024-03-20 ENCOUNTER — Ambulatory Visit: Admitting: Dietician

## 2024-03-20 DIAGNOSIS — F4323 Adjustment disorder with mixed anxiety and depressed mood: Secondary | ICD-10-CM

## 2024-03-20 DIAGNOSIS — Z658 Other specified problems related to psychosocial circumstances: Secondary | ICD-10-CM

## 2024-03-20 NOTE — BH Specialist Note (Signed)
 Integrated Behavioral Health via Telemedicine Visit  03/20/2024 Nancy Garrett [id number]  Number of Integrated Behavioral Health Clinician visits: 4- Fourth Visit  Session Start time: 1049   Session End time: 1133  Total time in minutes: 44  Referring Provider: Glenys Birk, MD Patient/Family location: Home [facility] Provider location: [facility]  All persons participating in visit: Patient agrees with treatment plan.  Types of Service: Individual psychotherapy and Video visit  I connected with Nancy Garrett and/or Nancy Garrett's children via  Telephone or Video Enabled Telemedicine Application  (Video is Caregility application) and verified that I am speaking with the correct person using two identifiers. Discussed confidentiality: Yes   I discussed the limitations of telemedicine and the availability of in person appointments.  Discussed there is a possibility of technology failure and discussed alternative modes of communication if that failure occurs.  I discussed that engaging in this telemedicine visit, they consent to the provision of behavioral healthcare and the services will be billed under their insurance.  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  Duration of problem: Ongoing ; Severity of problem: moderate  Patient and/or Family's Strengths/Protective Factors: Social connections, Concrete supports in place (healthy food, safe environments, etc.), and Sense of purpose  Goals Addressed: Patient will:  Reduce symptoms of: anxiety and stress   Increase knowledge and/or ability of: stress reduction   Demonstrate  ability to: Increase healthy adjustment to current life circumstances and Increase motivation to adhere to plan of care  Progress towards Goals: Ongoing    Interventions: Interventions utilized:  Motivational Interviewing and Supportive Reflection Standardized Assessments completed: Not Needed    Patient and/or Family Response: Patient agrees with treatment plan.   Clinical Assessment/Diagnosis  Adjustment disorder with mixed anxiety and depressed mood  Psychosocial stressors   Patient may benefit from continued therapeutic intervention  .  Plan: Follow up with behavioral health clinician on : Two weeks Behavioral recommendations:  -Continue plan to attend psychiatry appointment prior to procedure, as required by medical provider -Continue plan to attend sister's graduation from basic training (contact court regarding being out-of-state during jury duty to find out options) -Continue self-coping strategies that remain helpful to manage (keeping cool with small fan, time with supportive people in life, attending to own medical appointments, etc.) Referral(s): Integrated [employer] (In Clinic)  I discussed the assessment and treatment plan with the patient and/or parent/guardian. They were provided an opportunity to ask questions and all were answered. They agreed with the plan and demonstrated an understanding of the instructions.   They were advised to call back or seek an in-person evaluation if the symptoms worsen or if the condition fails to improve as anticipated.  Warren JAYSON Mering, LCSW     03/02/2024   10:37 AM 11/01/2023   10:03 AM 10/13/2023    7:37 AM 12/01/2022    9:42 AM 08/19/2022   11:15 AM  Depression screen PHQ 2/9  Decreased Interest 0 0 0 0 0  Down, Depressed, Hopeless 0 0 0 0 0  PHQ - 2 Score 0 0 0 0 0  Altered sleeping   1 0 0  Tired, decreased energy   1 0 2  Change in appetite   0 0 0  Feeling bad or failure about yourself    0 0 0   Trouble concentrating   0 0 0  Moving slowly or fidgety/restless   0 0 0  Suicidal thoughts    0 0  PHQ-9 Score   2 0 2      12/01/2022    9:40 AM 08/19/2022   11:15 AM 06/17/2022    9:27 AM 06/16/2022    9:56 AM  GAD 7 : Generalized Anxiety Score  Nervous, Anxious, on Edge 1 0 1 1  Control/stop worrying 1 0 0 2  Worry too much - different things 1 0 0 0  Trouble relaxing 0 0 1 1  Restless 0 0 0 0  Easily annoyed or irritable 1 1 1 1   Afraid - awful might happen 1 0 0 1  Total GAD 7 Score 5 1 3  6

## 2024-03-21 ENCOUNTER — Encounter: Attending: Surgery | Admitting: Dietician

## 2024-03-21 ENCOUNTER — Encounter: Payer: Self-pay | Admitting: Dietician

## 2024-03-21 VITALS — Ht 66.0 in | Wt 270.8 lb

## 2024-03-21 DIAGNOSIS — E669 Obesity, unspecified: Secondary | ICD-10-CM | POA: Insufficient documentation

## 2024-03-21 NOTE — Progress Notes (Signed)
 Nutrition Assessment for Bariatric Surgery: Pre-Surgery Behavioral and Nutrition Intervention Program   Medical Nutrition Therapy  Appt Start Time: 1417    End Time: 1454  Patient was seen on 03/02/2024 for Pre-Operative Nutrition Assessment. Purpose of todays visit  enhance perioperative outcomes along with a healthy weight maintenance   Referral stated Supervised Weight Loss (SWL) visits needed: 0  Not cleared at this time:  Pt to follow up for minimum of one more visit to assist pt with progressing through stages of change/further nutrition education. RD advised pt that this follow up visit is not mandated through insurance. Pt verbalized agreement.  Planned surgery: Conversion from Sleeve Gastrectomy to SADI-S Pt expectation of surgery: to be more healthy; healthier weight; relief from back pain  NUTRITION ASSESSMENT   Anthropometrics  Start weight at [facility]: 270.2 lbs (date: 03/02/2024)  Height: 66 in Weight today: 270.8 lbs BMI: 43.71 kg/m2     Clinical  Medical hx: sleeve gastrectomy, anxiety, tumor Medications: DULoxetine  (CYMBALTA ) 30 MG capsule ibuprofen  (ADVIL ) 800 MG tablet Prenatal Vit-Fe Fumarate-FA (PRENATAL MULTIVITAMIN) TABS tablet rizatriptan  (MAXALT -MLT) 5 MG disintegrating tablet terbinafine  (LAMISIL ) 1 % cream topiramate  (TOPAMAX ) 25 MG tablet Vitamin D , Ergocalciferol , (DRISDOL ) 1.25 MG (50000 UNIT) CAPS capsule Labs: Vit D 15.2 Notable signs/symptoms: none noted Any previous deficiencies? No  Evaluation of Nutritional Deficiencies: Micronutrient Nutrition Focused Physical Exam:  Hair: No issues observed Eyes: No issues observed Mouth: No issues observed Neck: No issues observed Nails: No issues observed Skin: No issues observed  Lifestyle & Dietary Hx  Pt states her friend introduced her to overnight oats, stating she added extra protein powder. Pt states her friends overnight oats had over 50 grams of protein. Pt states she did not hit 100 grams of  protein in a day. Pt states she will need to set aside time, and break from doing hair, to eat. Pt states she is remembering all the habits she had before, when she had the sleeve. Pt states she is doing better with fluids, stating she still has to work on it. Pt states she is getting at least 64 oz of fluids.  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)  Estimated daily fluid intake: 64 oz Supplements: Vit D, prenatal vitamin Current average weekly physical activity: ADLs  24-Hr Dietary Recall First Meal: 9 am skip or banana or bacon or grits or eggs Snack:  Second Meal: after 12 (sometimes close to 5 pm) easy dinner from [employer] (chicken or meatballs, with salad or vegetable) Snack: puff popcorn or sunflower seed grain in a rolled up ball with fruit or jerky or nuts or greek yogurt Third Meal: 8-9:30 pm baked chicken or BBQ and vinegar cole slaw Snack:  Beverages: sweet tea, water, water with flavorings, juice  Alcoholic beverages per week: 0-1 (one every two to three weeks)   Estimated Energy Needs Calories: 1500  NUTRITION DIAGNOSIS  Overweight/obesity ([place]-3.3) related to past poor dietary habits and physical inactivity as evidenced by patient w/ planned conversion to SADI-S surgery following dietary guidelines for continued weight loss.  NUTRITION INTERVENTION  Nutrition counseling (C-1) and education (E-2) to facilitate bariatric surgery goals.  Behavioral and Dietary Interventions  SADI-S surgery significantly alters nutrient absorption by bypassing a portion of the small intestine and reducing stomach size. As a result, patients require higher protein intake, typically 80-100 grams per day, to support healing, preserve lean mass, and maintain energy levels. Additionally, multivitamin supplementation must be more potent, especially  in fat-soluble vitamins (A, D, E, K), iron, calcium citrate (1,800 mg/day), and  vitamin B12, to prevent deficiencies that can develop rapidly post-op.  Increasing physical activity before bariatric surgery is important for building a sustainable habit that supports long-term success after the procedure. Regular movement helps improve cardiovascular health, enhances lung function, and boosts energy levels--making recovery smoother and safer. Establishing a consistent routine before surgery also makes it easier to transition into post-op activity goals, which are essential for maintaining weight loss, preserving muscle mass, and improving overall well-being.  After bariatric surgery, it's important to spread protein intake throughout the day, aiming for 80-100 grams daily. Consuming too much protein at once can overwhelm the digestive system and reduce absorption. By dividing protein into smaller portions across meals and snacks, patients can better meet nutritional needs, support healing, and maintain muscle mass without discomfort.  Pre-Op Goals Reviewed with the Patient  Goals New: increase physical activity; walk at the track Continue: Switch to non-caloric, non-carbonated and non-caffeinated beverages such as  water, unsweetened tea, Crystal Light and zero calorie beverages (aim for 64 oz. per day) Continue: Increase protein intake (eggs, fish, chicken, yogurt) before surgery; track protein; aim for 80-100 grams per day  Handouts Provided Include  Bariatric MyPlate  Learning Style & Readiness for Change Teaching method utilized: Visual, Auditory, and hands on  Demonstrated degree of understanding via: Teach Back  Readiness Level: preparation Barriers to learning/adherence to lifestyle change: affinity for sweet tea  RD's Notes for Next Visit Patient progress toward chosen goals   MONITORING & EVALUATION Dietary intake, weekly physical activity, body weight, and preoperative behavioral change goals   Next Steps  Pt to follow up for minimum of one more visit to  assist pt with progressing through stages of change/further nutrition education.

## 2024-03-27 ENCOUNTER — Ambulatory Visit (INDEPENDENT_AMBULATORY_CARE_PROVIDER_SITE_OTHER): Admitting: Licensed Clinical Social Worker

## 2024-03-27 DIAGNOSIS — F4320 Adjustment disorder, unspecified: Secondary | ICD-10-CM | POA: Diagnosis not present

## 2024-03-27 NOTE — Progress Notes (Signed)
 Virtual Visit via Video Note  I connected with Nancy Garrett on 03/27/24 at  5:00 PM EDT by a video enabled telemedicine application and verified that I am speaking with the correct person using two identifiers.  Location: Patient: personal vehicle, [place] Provider: virtual office,    I discussed the limitations of evaluation and management by telemedicine and the availability of in person appointments. The patient expressed understanding and agreed to proceed.  I discussed the assessment and treatment plan with the patient. The patient was provided an opportunity to ask questions and all were answered. The patient agreed with the plan and demonstrated an understanding of the instructions.   The patient was advised to call back or seek an in-person evaluation if the symptoms worsen or if the condition fails to improve as anticipated.  Comprehensive Clinical Assessment (CCA) Note  03/27/2024 Nancy Garrett [id number]  Chief Complaint:  Chief Complaint  Patient presents with   BARIATRIC SCREENING   Visit Diagnosis:  Encounter Diagnosis  Name Primary?   Adjustment disorder, unspecified type Yes   Disposition:  Clinician sees no significant psychological factors that would hinder the success of bariatric surgery at time of assessment. Clinician supports patient candidacy for Bariatric Surgery.   Patient reports realistic expectations post surgery, is aware of the pre and post surgical process, client reports that behavioral health diagnosis(es) are stable at time of assessment, client reports positive pre and post surgical support system, and client reports motivation to make positive change.      CCA Biopsychosocial Intake/Chief Complaint:  BARIATRIC SCREENING  Current Symptoms/Problems: Nancy Garrett is a 33 y.o. year old adult patient reporting to [facility] for preliminary screening to determine bariatric surgery eligibility. Patient reports that they have  tried several weight loss interventions in the past, including gastric sleeve, medical weight loss management, diets, exercise..  Nancy Garrett reports current medical concerns/medical history of brain tumor, migraines, chronic pain in back/hips.  Patient reports negative history of depression, anxiety, or other mental health disorders.  Nancy Garrett denies SI, HI, or perceptual disturbances at time of assessment. Patient denies substance use issues at time of assessment. Pt states she does drink etoh, but only 1-2 drinks once/twice per week.  Nancy Garrett  reports that they are motivated to make positive changes to contribute to improved wellness and are seeking bariatric weight loss surgery as an intervention to support wellness goals.   Patient Reported Schizophrenia/Schizoaffective Diagnosis in Past: No   Strengths: Pt reports good family support, has independent personality  Preferences: Due to unsuccessful weight loss interventions in the past, patient seeking bariatric weight loss surgery  Abilities: Patient is able to make positive behavioral choices, patient has ability to develop wellness goals for self, patient has the ability to work full-time, patient is able to make lasting change   Type of Services Patient Feels are Needed: Patient seeking bariatric weight loss surgery   Initial Clinical Notes/Concerns: Patient denies previous history of mental health treatment--patient does have a psychotherapist that she is working with on a regular basis.  Patient has a prescription for Cymbalta , but is not taking the medication   Mental Health Symptoms Depression:  Change in energy/activity; Fatigue; Sleep (too much or little) (insomnia most days; chronic pain)   Duration of Depressive symptoms: Greater than two weeks   Mania:  None   Anxiety:   Worrying; Sleep   Psychosis:  None   Duration of Psychotic symptoms: No data recorded  Trauma:  None    Obsessions:  None   Compulsions:  None   Inattention:  None   Hyperactivity/Impulsivity:  None   Oppositional/Defiant Behaviors:  None   Emotional Irregularity:  None   Other Mood/Personality Symptoms:  No data recorded   Mental Status Exam Appearance and self-care  Stature:  Average   Weight:  Obese   Clothing:  Neat/clean   Grooming:  Normal   Cosmetic use:  Age appropriate   Posture/gait:  Normal   Motor activity:  Not Remarkable   Sensorium  Attention:  Normal   Concentration:  Normal   Orientation:  X5   Recall/memory:  Normal   Affect and Mood  Affect:  Appropriate   Mood:  Other (Comment) (WNL)   Relating  Eye contact:  Normal   Facial expression:  Responsive   Attitude toward examiner:  Cooperative   Thought and Language  Speech flow: Clear and Coherent   Thought content:  Appropriate to Mood and Circumstances   Preoccupation:  None   Hallucinations:  None   Organization:   coherent, goal-directed  Company secretary of Knowledge:  Good   Intelligence:  Average   Abstraction:  Normal   Judgement:  Good   Reality Testing:  Realistic   Insight:  Good   Decision Making:  Normal   Social Functioning  Social Maturity:  Responsible   Social Judgement:  Normal   Stress  Stressors:  Family conflict; Grief/losses; Housing; Illness; Relationship (last year lost stepfather; moved out of primary home and renting home, husband and pt going through counseling)   Coping Ability:  Normal   Skill Deficits:  None   Supports:  Family (mom, grandmother, husband)     Religion: Religion/Spirituality Are You A Religious Person?: No  Leisure/Recreation: Leisure / Recreation Do You Have Hobbies?: Yes Leisure and Hobbies: spending time with family; traveling with children, outdoor events  Exercise/Diet: Exercise/Diet Do You Exercise?: Yes What Type of Exercise Do You Do?: Run/Walk Have You Gained or Lost A Significant  Amount of Weight in the Past Six Months?: No Do You Follow a Special Diet?: Yes Type of Diet: prioritizing protein, wants to increase water Do You Have Any Trouble Sleeping?: Yes Explanation of Sleeping Difficulties: hard to fall asleep and hard to stay asleep   CCA Employment/Education Employment/Work Situation: Employment / Work Situation Employment Situation: Employed Where is Patient Currently Employed?: 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, CMA, cosmetology Did You Have An Individualized Education Program (IIEP): No Did You Have Any Difficulty At School?: No Patient's Education Has Been Impacted by Current Illness: No   CCA Family/Childhood History Family and Relationship History: Family history Marital status: Married Number of Years Married: 3 What types of issues is patient dealing with in the relationship?: pt has been with husband for 13 years Additional relationship information:  pt reports that husbnd is good support system for pt Does patient have children?: Yes How many children?: 2  Childhood History:  Childhood History By whom was/is the patient raised?: Mother/father and step-parent Additional childhood history information: Pt reports that her mom was military/worked in prison system but that they had a solid  relationship Description of patient's relationship with caregiver when they were a child: pt reports conflict with parents as a child Patient's description of current relationship with people who raised him/her: pt reports that she has a positive relationship with her mother currently--some conflict at times How were you disciplined when you got in trouble as a child/adolescent?: pt feels that mom took anger out on me as a child Does patient have siblings?: Yes Number of Siblings: 5 Description of patient's current relationship with siblings: bio and step sibs. positive relationships with all Did patient suffer any verbal/emotional/physical/sexual abuse as a child?: No Did patient suffer from severe childhood neglect?: No Has patient ever been sexually abused/assaulted/raped as an adolescent or adult?: No Was the patient ever a victim of a crime or a disaster?: No Witnessed domestic violence?: No Has patient been affected by domestic violence as an adult?: No  Child/Adolescent Assessment:     CCA Substance Use Alcohol/Drug Use: Alcohol / Drug Use Pain Medications: SEE MAR Prescriptions: SEE MAR Over the Counter: SEE MAR History of alcohol / drug use?: No history of alcohol / drug abuse Withdrawal Symptoms: None   ASAM's:  Six Dimensions of Multidimensional Assessment  Dimension 1:  Acute Intoxication and/or Withdrawal Potential:   Dimension 1:  Description of individual's past and current experiences of substance use and withdrawal: pt reports social etoh 1-2 drinks per week  Dimension 2:  Biomedical Conditions and Complications:   Dimension 2:  Description of patient's biomedical conditions and  complications: none  Dimension 3:  Emotional, Behavioral, or Cognitive Conditions and Complications:  Dimension 3:  Description of emotional, behavioral, or cognitive conditions and complications: none  Dimension 4:  Readiness to Change:  Dimension 4:  Description of Readiness to Change  criteria: none  Dimension 5:  Relapse, Continued use, or Continued Problem Potential:  Dimension 5:  Relapse, continued use, or continued problem potential critiera description: none  Dimension 6:  Recovery/Living Environment:  Dimension 6:  Recovery/Iiving environment criteria description: none  ASAM Severity Score: ASAM's Severity Rating Score: 0  ASAM Recommended Level of Treatment: ASAM Recommended Level of Treatment: Level I Outpatient Treatment   Substance use Disorder (SUD) Substance Use Disorder (SUD)  Checklist Symptoms of Substance Use:  (none)  Recommendations for Services/Supports/Treatments: Recommendations for Services/Supports/Treatments Recommendations For Services/Supports/Treatments: Individual Therapy (continue outpatient therapy with therapist of record)  DSM5 Diagnoses: Patient Active Problem List   Diagnosis Date Noted   Vitamin D  deficiency 10/27/2023   Prediabetes    Hyperlipidemia 10/13/2023   Abnormal MRI 04/15/2023   Chronic migraine w/o aura w/o status migrainosus, not intractable 04/15/2023   Menorrhagia with regular cycle 01/18/2023   Encounter for sterilization 01/18/2023   Ulnar neuropathy at elbow of left upper extremity 07/07/2022   Lumbar radiculopathy 07/07/2022   Chronic daily headache 05/05/2022   ASCUS of cervix with negative high risk HPV 03/08/2022   Morbid obesity (HCC) 02/28/2022   H/O gastric sleeve 02/28/2022   History of gestational diabetes 02/28/2022   History of pre-eclampsia 02/28/2022   Facet degeneration of lumbar region 10/15/2021    Patient Centered Plan: Patient is on the following Treatment Plan(s):   Behavioral Health Assessment  Patient Name Nancy Garrett Date of Birth:  Dec 18, 1990 Age:  33 y.o. Date of Interview:  03/27/24 Gender:  F   Date of Report : 03/27/24 Purpose:   Bariatric/Weight-loss Surgery (pre-operative evaluation)    Assessment Instruments:  DSM-5-TR Self-Rated Level 1 Cross-Cutting Symptom  Measure--Adult Severity Measure for Generalized Anxiety Disorder--Adult EAT-26 (Eating Attitudes Test) SSS-8 (Somatic Symptom Scale) BES (Binge Eating Scale)  Chief Complaint: BARIATRIC SCREENING  Client Background: Patient is a 33 year old female seeking weight loss surgery. Patient has college education and is currently working as a Programme researcher, broadcasting/film/video.  Patients marital status is married.   The patient is 5 feet 6 inches tall and 270 lbs., reflecting a BMI of 43.6 classifying patient in the obese range and at further risk of co-morbid diseases.  Tobacco Use: Patient denies tobacco use.   PATIENT BEHAVIORAL ASSESSMENT SCORES  Personal History of Mental Illness: Patient denies treatment for depression and anxiety.  Patient has prescription for Cymbalta , but states that she does not take it.  Mental Status Examination: Patient was oriented x5 (person, place, situation, time, and object). Patient was appropriately groomed, and neatly dressed. Patient was alert, engaged, pleasant, and cooperative. Patient denies suicidal and homicidal ideations or any perceptual disturbances. Patient denies self-injury.   DSM-5-TR Self-Rated Level 1 Cross-Cutting Symptom Measure--Adult: Patient completed 23-item questionnaire assessing symptoms related to depression, anger, mania, anxiety, somatic symptoms, suicidal ideation, psychosis, insomnia, memory concerns, repetitive behaviors, dissociation, personality functioning and substance use. Nancy Garrett scored 5  in depression domain.   Severity Measure for Generalized Anxiety Disorder--Adult: Patient completed a 10-item  scale. Total scores can range from 0 to 40. A raw score is calculated by summing the answer to each question, and an average total score is achieved by dividing the raw score by the number of items (e.g., 10).Tatumn Faye Garrett had a total raw score of 8 out of 40 which was divided by the total number of questions  answered (10) to get an average score of 0.2 which indicates no significant anxiety.   EAT-26: The EAT-26 is a twenty-six-question screening tool to identify symptoms of dieting behaviors, bulimia, food preoccupation and oral control.  Nancy Garrett scored 8 out of 26. Scores below a 20 are considered not meeting criteria for disordered eating. Patient denies inducing vomiting, or intentional meal skipping. Patient denies binge eating behaviors. Patient denies laxative abuse. Patient does not meet criteria for a DSM-V eating disorder.  SSS-8: The SSS-8, or Somatic Symptom Scale-8, is a brief self-report questionnaire used to assess the perceived burden of common somatic (physical) symptoms.  (SSS-8) is scored by summing the responses to eight items, each rated on a 5-point Likert scale from 0 (Not at all) to 4 (Very much). Total scores range from 0 to 32, with higher scores indicating greater somatic symptom burden. Scores are categorized into five severity levels: no/minimal, low, medium, high, and very high somatic symptom burden. Nancy Garrett scored 10 out of 32, which indicates medium score.   BES: The Binge Eating Scale (BES) is a self-report questionnaire developed to assess the presence and severity of binge eating behavior, particularly in individuals who may be struggling with obesity or disordered eating patterns. Scoring: 0-7: Minimal binge eating 8-15: Mild binge eating 16-23: Moderate binge eating 24-31: Severe binge eating 32-40: Extreme binge eating.  Nancy Garrett scored 0 out of 40, which indicates no significant score.   Conclusion & Recommendations:   Health history and current  assessment reflect that patient is suitable to be a candidate for bariatric surgery. Patient understands the procedure, the risks associated with it, and the importance of post-operative holistic care (Physical, Spiritual/Values, Relationships, and Mental/Emotional health) with access  to resources for support as needed. The patient has made an informed decision to proceed with procedure. The patient is motivated and expressed understanding of the post-surgical requirements. Patient's psychological assessment will be valid from today's date for 6 months (09/25/2024). After that date, a follow-up appointment will be needed to re-evaluate the patient's psychological status.   Clinician sees no significant psychological factors that would hinder the success of bariatric surgery at time of assessment. Clinician supports patient candidacy for Bariatric Surgery.   Nancy JONELLE Brisker, MSW, LCSW Licensed Clinical Social [facility]    Referrals to Alternative Service(s): Referred to Alternative Service(s):   Place:   Date:   Time:    Referred to Alternative Service(s):   Place:   Date:   Time:    Referred to Alternative Service(s):   Place:   Date:   Time:    Referred to Alternative Service(s):   Place:   Date:   Time:      Collaboration of Care: Other clinician releases patient back to bariatric team members and encouraged patient to follow all ongoing team recommendations and to continue working with psychotherapist of record  Patient/Guardian was advised Release of Information must be obtained prior to any record release in order to collaborate their care with an outside provider. Patient/Guardian was advised if they have not already done so to contact the registration department to sign all necessary forms in order for us  to release information regarding their care.   Consent: Patient/Guardian gives verbal consent for treatment and assignment of benefits for services provided during this visit. Patient/Guardian expressed understanding and agreed to proceed.   Suhayb Anzalone R Jailynn Lavalais, LCSW

## 2024-04-03 ENCOUNTER — Ambulatory Visit (INDEPENDENT_AMBULATORY_CARE_PROVIDER_SITE_OTHER): Admitting: Family Medicine

## 2024-04-03 ENCOUNTER — Encounter (INDEPENDENT_AMBULATORY_CARE_PROVIDER_SITE_OTHER): Payer: Self-pay | Admitting: Family Medicine

## 2024-04-03 ENCOUNTER — Encounter (HOSPITAL_COMMUNITY): Payer: Self-pay | Admitting: Surgery

## 2024-04-03 VITALS — BP 138/74 | HR 78 | Temp 98.0°F | Ht 67.0 in | Wt 264.0 lb

## 2024-04-03 DIAGNOSIS — R7303 Prediabetes: Secondary | ICD-10-CM | POA: Diagnosis not present

## 2024-04-03 DIAGNOSIS — E559 Vitamin D deficiency, unspecified: Secondary | ICD-10-CM | POA: Diagnosis not present

## 2024-04-03 DIAGNOSIS — E7849 Other hyperlipidemia: Secondary | ICD-10-CM | POA: Diagnosis not present

## 2024-04-03 DIAGNOSIS — Z6841 Body Mass Index (BMI) 40.0 and over, adult: Secondary | ICD-10-CM

## 2024-04-03 DIAGNOSIS — D508 Other iron deficiency anemias: Secondary | ICD-10-CM

## 2024-04-03 NOTE — Progress Notes (Signed)
 SUBJECTIVE:  Chief Complaint: Obesity  Interim History: Patient has been dealing with some of her kids being ill.  Her baby got hand foot and mouth.  She has been working with her daughters cheer team and that is over now.  She went to a wedding and danced quite a bit.  She is getting really into overnight oats and has been doing strawberry cheesecake flavored.  She is looking for sugar substitute that is newer that is supposed to not have an aftertaste.  Aside from the overnight oats she has been sticking to replacements like cauliflower rice instead of rice and zucchini noodles instead of pasta.  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 She is planning on an upcoming travel.  Nancy Garrett is here to discuss her progress with her obesity treatment plan. She is on the Category 3 Plan and states she is following her eating plan approximately 100 % of the time. She states she is exercising 30 minutes 4-7 times per week.   OBJECTIVE: Visit Diagnoses: Problem List Items Addressed This Visit       Other   Morbid obesity (HCC)   Hyperlipidemia - Primary   Relevant Orders   Lipid Panel With LDL/HDL Ratio   Vitamin D  deficiency   Relevant Orders   VITAMIN D  25 Hydroxy (Vit-D Deficiency, Fractures)   Prediabetes   Relevant Orders   Comprehensive metabolic panel with GFR   Hemoglobin A1c   Insulin , random   Other Visit Diagnoses       Other iron deficiency anemia       Relevant Orders   CBC w/Diff/Platelet   Anemia panel     BMI 40.0-44.9, adult (HCC)           Vitals Temp: 98 F (36.7 C) BP: 138/74 Pulse Rate: 78 SpO2: 100 %   Anthropometric Measurements Height: 5' 7 (1.702 m) Weight: 264 lb (119.7 kg) BMI (Calculated): 41.34 Weight at Last Visit: 269 lb Weight Lost Since Last Visit: 5 Weight Gained Since Last Visit: 0 Starting Weight: 268 lb Total Weight Loss (lbs): 4 lb (1.814 kg)   Body Composition  Body Fat %: 49.1  % Fat Mass (lbs): 129.8 lbs Muscle Mass (lbs): 127.6 lbs Total Body Water (lbs): 93.4 lbs Visceral Fat Rating : 13   Other Clinical Data Fasting: yes Labs: yes Today's Visit #: 7 Starting Date: 10/13/23 Comments: Cat 3     ASSESSMENT AND PLAN: Assessment & Plan Other hyperlipidemia Previously elevated LDL with HDL at goal.  Will repeat fasting lipid panel today and discuss results at next appointment. Prediabetes Patient has been working on dietary modification including limiting simple carbohydrates.  Will repeat CMP, A1c and fasting insulin  level today. Vitamin D  deficiency Will assess vitamin D  supplementation response with repeating vitamin D  level today.  Plan for treatment modification depending on vitamin D  level. Other iron deficiency anemia History of iron deficiency anemia.  Will repeat CBC and anemia panel and make adjustments to iron supplementation pending results. Morbid obesity (HCC)  BMI 40.0-44.9, adult (HCC)    Diet: Nancy Garrett is currently in the action stage of change. As such, her goal is to continue with weight loss efforts and has agreed to the Category 3 Plan.   Exercise:  For substantial health benefits, adults should do at least 150 minutes (2 hours and 30 minutes) a week of moderate-intensity, or 75 minutes (1 hour and 15 minutes) a week of vigorous-intensity  aerobic physical activity, or an equivalent combination of moderate- and vigorous-intensity aerobic activity. Aerobic activity should be performed in episodes of at least 10 minutes, and preferably, it should be spread throughout the week.  Behavior Modification:  We discussed the following Behavioral Modification Strategies today: increasing lean protein intake, decreasing simple carbohydrates, no skipping meals, meal planning and cooking strategies, and planning for success.   Return in about 4 weeks (around 05/01/2024).   She was informed of the importance of frequent follow up visits to  maximize her success with intensive lifestyle modifications for her multiple health conditions.  Attestation Statements:   Reviewed by clinician on day of visit: allergies, medications, problem list, medical history, surgical history, family history, social history, and previous encounter notes.     Adelita Cho, MD

## 2024-04-04 ENCOUNTER — Encounter: Payer: Self-pay | Admitting: Dietician

## 2024-04-04 ENCOUNTER — Encounter: Attending: Surgery | Admitting: Dietician

## 2024-04-04 VITALS — Ht 66.0 in | Wt 270.5 lb

## 2024-04-04 DIAGNOSIS — E669 Obesity, unspecified: Secondary | ICD-10-CM | POA: Diagnosis present

## 2024-04-04 DIAGNOSIS — Z903 Acquired absence of stomach [part of]: Secondary | ICD-10-CM | POA: Insufficient documentation

## 2024-04-04 LAB — ANEMIA PANEL
Ferritin: 17 ng/mL (ref 15–150)
Folate, Hemolysate: 254 ng/mL
Folate, RBC: 661 ng/mL (ref >498)
Hematocrit: 38.4 % (ref 34.0–46.6)
Iron Saturation: 47 % (ref 15–55)
Iron: 177 ug/dL — ABNORMAL HIGH (ref 27–159)
Retic Ct Pct: 0.9 % (ref 0.6–2.6)
Total Iron Binding Capacity: 375 ug/dL (ref 250–450)
UIBC: 198 ug/dL (ref 131–425)
Vitamin B-12: 315 pg/mL (ref 232–1245)

## 2024-04-04 LAB — LIPID PANEL WITH LDL/HDL RATIO
Cholesterol, Total: 235 mg/dL — ABNORMAL HIGH (ref 100–199)
HDL: 62 mg/dL (ref >39)
LDL Chol Calc (NIH): 164 mg/dL — ABNORMAL HIGH (ref 0–99)
LDL/HDL Ratio: 2.6 ratio (ref 0.0–3.2)
Triglycerides: 57 mg/dL (ref 0–149)
VLDL Cholesterol Cal: 9 mg/dL (ref 5–40)

## 2024-04-04 LAB — CBC WITH DIFFERENTIAL/PLATELET
Basophils Absolute: 0 x10E3/uL (ref 0.0–0.2)
Basos: 1 %
EOS (ABSOLUTE): 0.1 x10E3/uL (ref 0.0–0.4)
Eos: 1 %
Hemoglobin: 11.7 g/dL (ref 11.1–15.9)
Immature Grans (Abs): 0 x10E3/uL (ref 0.0–0.1)
Immature Granulocytes: 0 %
Lymphocytes Absolute: 3 x10E3/uL (ref 0.7–3.1)
Lymphs: 39 %
MCH: 25.4 pg — ABNORMAL LOW (ref 26.6–33.0)
MCHC: 30.5 g/dL — ABNORMAL LOW (ref 31.5–35.7)
MCV: 83 fL (ref 79–97)
Monocytes Absolute: 0.4 x10E3/uL (ref 0.1–0.9)
Monocytes: 5 %
Neutrophils Absolute: 4.3 x10E3/uL (ref 1.4–7.0)
Neutrophils: 54 %
Platelets: 274 x10E3/uL (ref 150–450)
RBC: 4.61 x10E6/uL (ref 3.77–5.28)
RDW: 16.6 % — ABNORMAL HIGH (ref 11.7–15.4)
WBC: 7.9 x10E3/uL (ref 3.4–10.8)

## 2024-04-04 LAB — COMPREHENSIVE METABOLIC PANEL WITH GFR
ALT: 6 IU/L (ref 0–32)
AST: 11 IU/L (ref 0–40)
Albumin: 4.4 g/dL (ref 3.9–4.9)
Alkaline Phosphatase: 113 IU/L (ref 41–116)
BUN/Creatinine Ratio: 20 (ref 9–23)
BUN: 13 mg/dL (ref 6–20)
Bilirubin Total: 0.6 mg/dL (ref 0.0–1.2)
CO2: 22 mmol/L (ref 20–29)
Calcium: 9.4 mg/dL (ref 8.7–10.2)
Chloride: 104 mmol/L (ref 96–106)
Creatinine, Ser: 0.64 mg/dL (ref 0.57–1.00)
Globulin, Total: 2.7 g/dL (ref 1.5–4.5)
Glucose: 80 mg/dL (ref 70–99)
Potassium: 4.2 mmol/L (ref 3.5–5.2)
Sodium: 138 mmol/L (ref 134–144)
Total Protein: 7.1 g/dL (ref 6.0–8.5)
eGFR: 119 mL/min/1.73 (ref >59)

## 2024-04-04 LAB — HEMOGLOBIN A1C
Est. average glucose Bld gHb Est-mCnc: 126 mg/dL
Hgb A1c MFr Bld: 6 % — ABNORMAL HIGH (ref 4.8–5.6)

## 2024-04-04 LAB — INSULIN, RANDOM: INSULIN: 9.7 u[IU]/mL (ref 2.6–24.9)

## 2024-04-04 LAB — VITAMIN D 25 HYDROXY (VIT D DEFICIENCY, FRACTURES): Vit D, 25-Hydroxy: 16.6 ng/mL — ABNORMAL LOW (ref 30.0–100.0)

## 2024-04-04 NOTE — Progress Notes (Signed)
 Nutrition Assessment for Bariatric Surgery: Pre-Surgery Behavioral and Nutrition Intervention Program   Medical Nutrition Therapy  Appt Start Time: 1433    End Time: 1454  Patient was seen on 03/02/2024 for Pre-Operative Nutrition Assessment. Purpose of todays visit  enhance perioperative outcomes along with a healthy weight maintenance   Referral stated Supervised Weight Loss (SWL) visits needed: 0  Pt completed visits.   Pt has cleared nutrition requirements.  Planned surgery: Conversion from Sleeve Gastrectomy to SADI-S Pt expectation of surgery: to be more healthy; healthier weight; relief from back pain  NUTRITION ASSESSMENT   Anthropometrics  Start weight at [facility]: 270.2 lbs (date: 03/02/2024)  Height: 66 in Weight today: 270.5 lbs BMI: 43.66 kg/m2     Clinical  Medical hx: sleeve gastrectomy, anxiety, tumor Medications: DULoxetine  (CYMBALTA ) 30 MG capsule ibuprofen  (ADVIL ) 800 MG tablet Prenatal Vit-Fe Fumarate-FA (PRENATAL MULTIVITAMIN) TABS tablet rizatriptan  (MAXALT -MLT) 5 MG disintegrating tablet terbinafine  (LAMISIL ) 1 % cream topiramate  (TOPAMAX ) 25 MG tablet Vitamin D , Ergocalciferol , (DRISDOL ) 1.25 MG (50000 UNIT) CAPS capsule Labs: Vit D 15.2 Notable signs/symptoms: none noted Any previous deficiencies? No  Evaluation of Nutritional Deficiencies: Micronutrient Nutrition Focused Physical Exam:  Hair: No issues observed Eyes: No issues observed Mouth: No issues observed Neck: No issues observed Nails: No issues observed Skin: No issues observed  Lifestyle & Dietary Hx  Pt states she has been doing overnight oats with protein shake as the fluid, stating she adds chia seeds and cinnamon. Pt states she had a cramp dancing. 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 Pt states she has done well getting creative with food, and using non-starchy vegetables. Pt states she has been getting more protein,  stating she is getting protein with every meal or snack. Pt states she has struggled with irregular bowel movements. Pt states she has avoided sweet tea.  Estimated daily fluid intake: 64 oz Supplements: Vit D, prenatal vitamin Current average weekly physical activity: ADLs  24-Hr Dietary Recall First Meal: 9 am overnight oats made with protein powder. Snack:  Second Meal: after 12 (sometimes close to 5 pm) easy dinner from [employer] food (chicken or meatballs, with salad or vegetable) Snack: puff popcorn or sunflower seed grain in a rolled up ball with fruit or jerky or nuts or greek yogurt Third Meal: 8-9:30 pm baked chicken or BBQ and vinegar cole slaw Snack:  Beverages: water, water with flavorings  Alcoholic beverages per week: 0-1 (one every two to three weeks)   Estimated Energy Needs Calories: 1500  NUTRITION DIAGNOSIS  Overweight/obesity ([place]-3.3) related to past poor dietary habits and physical inactivity as evidenced by patient w/ planned conversion to SADI-S surgery following dietary guidelines for continued weight loss.  NUTRITION INTERVENTION  Nutrition counseling (C-1) and education (E-2) to facilitate bariatric surgery goals.  Behavioral and Dietary Interventions During the two-week modified full liquid diet following bariatric surgery, choosing the right protein shakes and powders is essential for healing and meeting nutritional needs. Products should contain no more than 5 grams of total carbohydrates and at least 15 grams of protein per serving. This ensures adequate protein intake to support tissue repair and preserve muscle mass. Reading nutrition labels carefully and sticking to this guideline helps patients stay on track and transition smoothly into the next dietary phase.  Pre-Op Goals Reviewed with the Patient  Goals Continue: increase physical activity; walk at the track 3 days per week. Continue: Switch to non-caloric, non-carbonated and non-caffeinated  beverages such as  water, unsweetened tea, Crystal Light and zero calorie beverages (aim for 64 oz. per day) Continue: Increase protein intake (eggs, fish, chicken, yogurt) before surgery; track protein; aim for 80-100 grams per day  Handouts Provided Include    Learning Style & Readiness for Change Teaching method utilized: Visual, Auditory, and hands on  Demonstrated degree of understanding via: Teach Back  Readiness Level: preparation Barriers to learning/adherence to lifestyle change: nothing identified  RD's Notes for Next Visit Patient progress toward chosen goals   MONITORING & EVALUATION Dietary intake, weekly physical activity, body weight, and preoperative behavioral change goals   Next Steps  Pt has completed visits. No further supervised visits required/recommended. Pt to follow up for minimum of one more visit to assist pt with progressing through stages of change/further nutrition education.

## 2024-04-09 NOTE — Assessment & Plan Note (Signed)
 Patient has been working on dietary modification including limiting simple carbohydrates.  Will repeat CMP, A1c and fasting insulin  level today.

## 2024-04-09 NOTE — Anesthesia Preprocedure Evaluation (Signed)
 Anesthesia Evaluation  Patient identified by MRN, date of birth, ID band Patient awake    Reviewed: Allergy & Precautions, NPO status , Patient's Chart, lab work & pertinent test results  History of Anesthesia Complications Negative for: history of anesthetic complications  Airway Mallampati: II  TM Distance: >3 FB Neck ROM: Full    Dental no notable dental hx.    Pulmonary neg pulmonary ROS   Pulmonary exam normal        Cardiovascular negative cardio ROS Normal cardiovascular exam     Neuro/Psych  Headaches  Anxiety        GI/Hepatic Neg liver ROS,GERD  ,,  Endo/Other    Class 3 obesity  Renal/GU negative Renal ROS     Musculoskeletal  (+) Arthritis ,    Abdominal   Peds  Hematology negative hematology ROS (+)   Anesthesia Other Findings   Reproductive/Obstetrics                              Anesthesia Physical Anesthesia Plan  ASA: 3  Anesthesia Plan: MAC   Post-op Pain Management: Minimal or no pain anticipated   Induction:   PONV Risk Score and Plan: Treatment may vary due to age or medical condition and Propofol  infusion  Airway Management Planned: Natural Airway and Nasal Cannula  Additional Equipment: None  Intra-op Plan:   Post-operative Plan:   Informed Consent: I have reviewed the patients History and Physical, chart, labs and discussed the procedure including the risks, benefits and alternatives for the proposed anesthesia with the patient or authorized representative who has indicated his/her understanding and acceptance.       Plan Discussed with: CRNA  Anesthesia Plan Comments:          Anesthesia Quick Evaluation

## 2024-04-09 NOTE — Assessment & Plan Note (Signed)
 Previously elevated LDL with HDL at goal.  Will repeat fasting lipid panel today and discuss results at next appointment.

## 2024-04-09 NOTE — Assessment & Plan Note (Signed)
 Will assess vitamin D  supplementation response with repeating vitamin D  level today.  Plan for treatment modification depending on vitamin D  level.

## 2024-04-10 ENCOUNTER — Encounter (HOSPITAL_COMMUNITY): Payer: Self-pay | Admitting: Surgery

## 2024-04-10 ENCOUNTER — Other Ambulatory Visit: Payer: Self-pay

## 2024-04-10 ENCOUNTER — Encounter (HOSPITAL_COMMUNITY)
Admission: RE | Disposition: A | Discharge status: Home / Self Care | Payer: Self-pay | Source: Home / Self Care | Attending: Surgery

## 2024-04-10 ENCOUNTER — Ambulatory Visit (HOSPITAL_COMMUNITY)
Admission: RE | Admit: 2024-04-10 | Discharge: 2024-04-10 | Disposition: A | Discharge status: Home / Self Care | Attending: Surgery | Admitting: Surgery

## 2024-04-10 ENCOUNTER — Ambulatory Visit (HOSPITAL_BASED_OUTPATIENT_CLINIC_OR_DEPARTMENT_OTHER): Payer: Self-pay | Admitting: Anesthesiology

## 2024-04-10 ENCOUNTER — Encounter (HOSPITAL_COMMUNITY): Payer: Self-pay | Admitting: Anesthesiology

## 2024-04-10 DIAGNOSIS — E669 Obesity, unspecified: Secondary | ICD-10-CM | POA: Insufficient documentation

## 2024-04-10 DIAGNOSIS — F419 Anxiety disorder, unspecified: Secondary | ICD-10-CM | POA: Diagnosis not present

## 2024-04-10 DIAGNOSIS — K449 Diaphragmatic hernia without obstruction or gangrene: Secondary | ICD-10-CM | POA: Diagnosis not present

## 2024-04-10 DIAGNOSIS — Z6841 Body Mass Index (BMI) 40.0 and over, adult: Secondary | ICD-10-CM | POA: Diagnosis not present

## 2024-04-10 DIAGNOSIS — K3189 Other diseases of stomach and duodenum: Secondary | ICD-10-CM | POA: Insufficient documentation

## 2024-04-10 DIAGNOSIS — K219 Gastro-esophageal reflux disease without esophagitis: Secondary | ICD-10-CM | POA: Diagnosis present

## 2024-04-10 HISTORY — PX: ESOPHAGOGASTRODUODENOSCOPY: SHX5428

## 2024-04-10 SURGERY — EGD (ESOPHAGOGASTRODUODENOSCOPY)
Anesthesia: Monitor Anesthesia Care

## 2024-04-10 MED ORDER — LIDOCAINE 2% (20 MG/ML) 5 ML SYRINGE
INTRAMUSCULAR | Status: DC | PRN
  Administered 2024-04-10: 80 mg via INTRAVENOUS

## 2024-04-10 MED ORDER — SODIUM CHLORIDE 0.9 % IV SOLN
INTRAVENOUS | Status: AC | PRN
  Administered 2024-04-10: 500 mL via INTRAMUSCULAR

## 2024-04-10 MED ORDER — SODIUM CHLORIDE 0.9 % IV SOLN: INTRAVENOUS | Status: DC | PRN

## 2024-04-10 MED ORDER — PROPOFOL 500 MG/50ML IV EMUL
INTRAVENOUS | Status: DC | PRN
  Administered 2024-04-10 (×6): 50 mg via INTRAVENOUS

## 2024-04-10 NOTE — Anesthesia Postprocedure Evaluation (Signed)
 Anesthesia Post Note  Patient: Nancy Garrett  Procedure(s) Performed: EGD (ESOPHAGOGASTRODUODENOSCOPY)     Anesthesia Type: MAC Anesthetic complications: no   No notable events documented.  Last Vitals:  Vitals:   04/10/24 0800 04/10/24 0810  BP: 119/81 (!) 130/93  Pulse: 97 87  Resp: 19 11  Temp:    SpO2: 99% 100%    Last Pain:  Vitals:   04/10/24 0800  TempSrc:   PainSc: 1                  Vertell Row

## 2024-04-10 NOTE — Transfer of Care (Signed)
 Immediate Anesthesia Transfer of Care Note  Patient: Nancy Garrett  Procedure(s) Performed: EGD (ESOPHAGOGASTRODUODENOSCOPY)  Patient Location: PACU  Anesthesia Type:MAC  Level of Consciousness: awake and alert   Airway & Oxygen Therapy: Patient Spontanous Breathing and Patient connected to face mask oxygen  Post-op Assessment: Report given to RN and Post -op Vital signs reviewed and stable  Post vital signs: Reviewed and stable  Last Vitals:  Vitals Value Taken Time  BP    Temp    Pulse    Resp    SpO2      Last Pain:  Vitals:   04/10/24 0656  TempSrc: Temporal  PainSc: 0-No pain         Complications: No notable events documented.

## 2024-04-10 NOTE — H&P (Signed)
 Admitting Physician: Deward PARAS Debe Anfinson  Service: Bariatric surgery  CC: EGD prior to bariatric surgery  Subjective   HPI: Nancy Garrett is an 33 y.o. 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    Past Surgical History:  Procedure Laterality Date   DILITATION & CURRETTAGE/HYSTROSCOPY WITH HYDROTHERMAL ABLATION N/A 04/06/2023   Procedure: DILATATION & CURETTAGE/HYSTEROSCOPY WITH HYDROTHERMAL ABLATION;  Surgeon: Fredirick Glenys RAMAN, MD;  Location: [facility] OR;  Service: Gynecology;  Laterality: N/A;   LAPAROSCOPIC BILATERAL SALPINGECTOMY Bilateral 04/06/2023   Procedure: LAPAROSCOPIC BILATERAL SALPINGECTOMY;  Surgeon: Fredirick Glenys RAMAN, MD;  Location: [facility] OR;  Service: Gynecology;  Laterality: Bilateral;   LAPAROSCOPIC GASTRIC SLEEVE RESECTION  08/24/2017   Dr. Unice Carpen. Done in [place]   TONSILLECTOMY     As a child   TUBAL LIGATION  03/2023    Family History  Problem Relation Age of Onset   Hypertension Mother    High blood pressure Mother    Diabetes Father    Diabetes Sister    Stroke Sister    High blood pressure Sister     Social:  reports that she has never smoked. She has never used smokeless tobacco. She reports that she does not currently use alcohol. She reports that she does not use drugs.  Allergies:  Allergies  Allergen Reactions   Gadolinium Hives, Itching and Nausea And Vomiting    Pt reports she has allergy to MRI contrast not CT contrast. AV, RN 11/24/22   Iodinated Contrast Media    Ivp Dye [Iodinated Contrast Media] Nausea And Vomiting    Medications: Current Outpatient Medications  Medication Instructions   ibuprofen  (ADVIL ) 800 mg, Oral, Every 8 hours PRN   Prenatal Vit-Fe Fumarate-FA (PRENATAL  MULTIVITAMIN) TABS tablet 1 tablet, Daily   rizatriptan  (MAXALT -MLT) 5 mg, Oral, As needed, May repeat in 2 hours if needed   terbinafine  (LAMISIL ) 1 % cream APPLY TO THE AFFECTED AREAS TWICE DAILY   topiramate  (TOPAMAX ) 25 mg, Oral, 2 times daily   Vitamin D  (Ergocalciferol ) (DRISDOL ) 50,000 Units, Oral, Every 7 days    ROS - all of the below systems have been reviewed with the patient and positives are indicated with bold text General: chills, fever or night sweats Eyes: blurry vision or double vision ENT: epistaxis or sore throat Allergy/Immunology: itchy/watery eyes or nasal congestion Hematologic/Lymphatic: bleeding problems, blood clots or swollen lymph nodes Endocrine: temperature intolerance or unexpected weight changes Breast: new or changing breast lumps or nipple discharge Resp: cough, shortness of breath, or wheezing CV: chest pain or dyspnea on exertion GI: as per HPI GU: dysuria, trouble voiding, or hematuria MSK: joint pain or joint stiffness Neuro: TIA or stroke symptoms Derm: pruritus and skin lesion changes Psych: anxiety and depression  Objective   PE Blood pressure 131/75, pulse 82, temperature 97.8 F (36.6 C), temperature source Temporal, resp. rate 17, height 5' 6 (1.676 m), weight 122.7 kg, SpO2 99%, not currently breastfeeding. Constitutional: NAD; conversant; no deformities Eyes: Moist conjunctiva; no lid lag; anicteric; PERRL Neck: Trachea midline; no thyromegaly Lungs: Normal respiratory effort; no tactile fremitus CV:  RRR; no palpable thrills; no pitting edema GI: Abd Soft, nontender; no palpable hepatosplenomegaly MSK: Normal range of motion of extremities; no clubbing/cyanosis Psychiatric: Appropriate affect; alert and oriented x3 Lymphatic: No palpable cervical or axillary lymphadenopathy  No results found for this or any previous visit (from the past 24 hours).  Imaging Orders  No imaging studies ordered today     Assessment and Plan    Nancy Garrett is an 33 y.o. female with obesity, presenting for EGD prior to bariatric surgery.  I explained my rational for performing upper endoscopy in my bariatric patients. During the procedure I will biopsy for H. Pylori, evaluate for hiatal hernia, evaluate for reflux esophagitis and look for any other abnormalities that may influence the procedure. We discussed the risks, benefits and alternative to this procedure and the patient granted consent to proceed.     Deward JINNY Foy, MD  [facility] Use [email or web address] to contact on call provider

## 2024-04-10 NOTE — Op Note (Signed)
 [facility] Patient Name: Nancy Garrett Procedure Date: 04/10/2024 MRN: [id number] Attending MD: Deward JINNY Foy , ,  Date of Birth: 1990-11-23 CSN: [id number] Age: 33 Admit Type: Outpatient Procedure:                Upper GI endoscopy Indications:               Providers:                Deward DOROTHA Foy, Randall Lines, RN, [facility]                            [place], Technician, Adell Hartshorn, CRNA Referring MD:              Medicines:                Monitored Anesthesia Care Complications:            No immediate complications. Estimated Blood Loss:     Estimated blood loss was minimal. Procedure:                Pre-Anesthesia Assessment:                           - Prior to the procedure, a History and Physical                            was performed, and patient medications and                            allergies were reviewed. The patient is competent.                            The risks and benefits of the procedure and the                            sedation options and risks were discussed with the                            patient. All questions were answered and informed                            consent was obtained. Patient identification and                            proposed procedure were verified by the physician                            in the pre-procedure area. Mental Status                            Examination: alert and oriented. Airway                            Examination: normal oropharyngeal airway and neck                            mobility.  Respiratory Examination: clear to                            auscultation. CV Examination: normal. Prophylactic                            Antibiotics: The patient does not require                            prophylactic antibiotics. Prior Anticoagulants: The                            patient has taken no anticoagulant or antiplatelet                            agents. ASA  Grade Assessment: II - A patient with                            mild systemic disease. After reviewing the risks                            and benefits, the patient was deemed in                            satisfactory condition to undergo the procedure.                            The anesthesia plan was to use monitored anesthesia                            care (MAC). Immediately prior to administration of                            medications, the patient was re-assessed for                            adequacy to receive sedatives. The heart rate,                            respiratory rate, oxygen saturations, blood                            pressure, adequacy of pulmonary ventilation, and                            response to care were monitored throughout the                            procedure. The physical status of the patient was                            re-assessed after the procedure.  After obtaining informed consent, the endoscope was                            passed under direct vision. Throughout the                            procedure, the patient's blood pressure, pulse, and                            oxygen saturations were monitored continuously. The                            [id number]-[id number] ([id number] Olympus endoscope was introduced                            through the mouth, and advanced to the second part                            of duodenum. The upper GI endoscopy was                            accomplished without difficulty. The patient                            tolerated the procedure well. Scope In: Scope Out: Findings:      The lower third of the esophagus was moderately tortuous.      Diffuse mildly erythematous mucosa without bleeding was found in the       prepyloric region of the stomach. Biopsies were taken with a cold       forceps for Helicobacter pylori testing. Verification of patient       identification for the  specimen was done. Estimated blood loss was       minimal.      A large hiatal hernia was present.      The in the duodenum was normal. Impression:               - Tortuous esophagus.                           - Erythematous mucosa in the prepyloric region of                            the stomach. Biopsied.                           - Large hiatal hernia.                           - Normal. Moderate Sedation:      Moderate (conscious) sedation was personally administered by an       anesthesia professional. The following parameters were monitored: oxygen       saturation, heart rate, blood pressure, and response to care. Total       physician intraservice time was 15 minutes. Recommendation:           - Discharge patient to home (ambulatory).                           -  Resume previous diet.                           - Continue present medications.                           - Await pathology results. Procedure Code(s):        --- Professional ---                           [phone], Esophagogastroduodenoscopy, flexible,                            transoral; with biopsy, single or multiple Diagnosis Code(s):        --- Professional ---                           K31.89, Other diseases of stomach and duodenum                           K44.9, Diaphragmatic hernia without obstruction or                            gangrene CPT copyright 2022 [employer]. All rights reserved. The codes documented in this report are preliminary and upon coder review may  be revised to meet current compliance requirements. Deward PARAS Heli Dino,  04/10/2024 8:05:03 AM This report has been signed electronically. Number of Addenda: 0

## 2024-04-11 ENCOUNTER — Encounter (HOSPITAL_COMMUNITY): Payer: Self-pay | Admitting: Surgery

## 2024-04-11 LAB — SURGICAL PATHOLOGY

## 2024-04-12 ENCOUNTER — Other Ambulatory Visit: Payer: Self-pay | Admitting: Surgery

## 2024-04-12 ENCOUNTER — Encounter: Payer: Self-pay | Admitting: Surgery

## 2024-04-12 DIAGNOSIS — K219 Gastro-esophageal reflux disease without esophagitis: Secondary | ICD-10-CM

## 2024-04-18 ENCOUNTER — Ambulatory Visit
Admission: RE | Admit: 2024-04-18 | Discharge: 2024-04-18 | Disposition: A | Discharge status: Home / Self Care | Source: Ambulatory Visit | Attending: Surgery | Admitting: Surgery

## 2024-04-18 DIAGNOSIS — K219 Gastro-esophageal reflux disease without esophagitis: Secondary | ICD-10-CM

## 2024-04-18 MED ORDER — IOPAMIDOL (ISOVUE-300) INJECTION 61%
100.0000 mL | Freq: Once | INTRAVENOUS | Status: AC | PRN
  Administered 2024-04-18: 100 mL via INTRAVENOUS

## 2024-05-01 ENCOUNTER — Ambulatory Visit: Admitting: Clinical

## 2024-05-01 DIAGNOSIS — F4323 Adjustment disorder with mixed anxiety and depressed mood: Secondary | ICD-10-CM

## 2024-05-01 NOTE — BH Specialist Note (Unsigned)
 Integrated Behavioral Health via Telemedicine Visit  05/02/2024 Nancy Garrett [id number]  Number of Integrated Behavioral Health Clinician visits: 5-Fifth Visit  Session Start time: 1049   Session End time: 1150  Total time in minutes: 61  Referring Provider: Glenys Birk, MD Patient/Family location: Home [facility] Provider location: Center for [facility]  All persons participating in visit: [name] and [facility] Nancy Garrett   Types of Service: Individual psychotherapy and Video visit  I connected with Nancy Garrett and/or Nancy Garrett's n/a via  Telephone or Video Enabled Telemedicine Application  (Video is Caregility application) and verified that I am speaking with the correct person using two identifiers. Discussed confidentiality: Yes   I discussed the limitations of telemedicine and the availability of in person appointments.  Discussed there is a possibility of technology failure and discussed alternative modes of communication if that failure occurs.  I discussed that engaging in this telemedicine visit, they consent to the provision of behavioral healthcare and the services will be billed under their insurance.  Patient and/or legal guardian expressed understanding and consented to Telemedicine visit: Yes   Presenting Concerns: Patient and/or family reports the following symptoms/concerns: Anxious waiting for upcoming surgeries to be scheduled, as well as stress over outcomes in changes with upcoming holiday plans.  Duration of problem: Recent ; Severity of problem: moderate  Patient and/or Family's Strengths/Protective Factors: Social connections, Concrete supports in place (healthy food, safe environments, etc.), and Sense of purpose  Goals Addressed: Patient will:  Reduce symptoms of: anxiety and stress   Increase knowledge and/or ability of: stress reduction   Demonstrate ability to:  Increase healthy adjustment to current life circumstances  Progress towards Goals: Ongoing    Interventions: Interventions utilized:  Supportive Reflection Standardized Assessments completed: Not Needed    Patient and/or Family Response: Patient agrees with treatment plan.   Clinical Assessment/Diagnosis  Adjustment disorder with mixed anxiety and depressed mood   Patient may benefit from continued therapeutic intervention.  Plan: Follow up with behavioral health clinician on : One month Behavioral recommendations:  -Continue daily self-coping strategies that have remained helpful -Continue plan to follow medical recommendations regarding upcoming surgeries -Continue plan for dinner out for Thanksgiving for stress reduction/reduce family tension Referral(s): Integrated [employer] (In Clinic)  I discussed the assessment and treatment plan with the patient and/or parent/guardian. They were provided an opportunity to ask questions and all were answered. They agreed with the plan and demonstrated an understanding of the instructions.   They were advised to call back or seek an in-person evaluation if the symptoms worsen or if the condition fails to improve as anticipated.  Lasonya Hubner C Mamie Diiorio, LCSW

## 2024-05-02 ENCOUNTER — Ambulatory Visit (INDEPENDENT_AMBULATORY_CARE_PROVIDER_SITE_OTHER): Payer: Self-pay | Admitting: Family Medicine

## 2024-05-02 VITALS — BP 101/64 | HR 87 | Temp 98.1°F | Ht 67.0 in | Wt 265.0 lb

## 2024-05-02 DIAGNOSIS — Z6841 Body Mass Index (BMI) 40.0 and over, adult: Secondary | ICD-10-CM | POA: Diagnosis not present

## 2024-05-02 DIAGNOSIS — E7849 Other hyperlipidemia: Secondary | ICD-10-CM | POA: Diagnosis not present

## 2024-05-02 DIAGNOSIS — E559 Vitamin D deficiency, unspecified: Secondary | ICD-10-CM

## 2024-05-02 MED ORDER — VITAMIN D (ERGOCALCIFEROL) 1.25 MG (50000 UNIT) PO CAPS: 50000.0000 [IU] | ORAL_CAPSULE | ORAL | 0 refills | Status: AC

## 2024-05-02 NOTE — Progress Notes (Signed)
 SUBJECTIVE:  Chief Complaint: Obesity  Interim History: Patient here for follow up.  She went to [place] to see her sister.  Thankfully she did not experience any issues with her flights. For Thanksgiving she and her family are planning to go out to eat at [employer] and she isn't cooking. Her daughter's cheer is taking over her weekends.  Everything for her bariatric surgery revision has been approved and she is awaiting surgery date.  She has already chosen her protein shakes and wants everything medication wise to be on subscription so she doesn't miss any days.  She is leaning heavily into overnight oatmeal to get breakfast that is high protein.  She is planning on the SIADI and cholestectomy.  Hasn't gotten her pre-op diet to start yet.  Nancy Garrett is here to discuss her progress with her obesity treatment plan. She is on the Category 3 Plan and states she is following her eating plan approximately 100 % of the time. She states she is walking a lot.   OBJECTIVE: Visit Diagnoses: Problem List Items Addressed This Visit       Other   Morbid obesity (HCC)   Hyperlipidemia - Primary   Vitamin D  deficiency   Relevant Medications   Vitamin D , Ergocalciferol , (DRISDOL ) 1.25 MG (50000 UNIT) CAPS capsule (Start on 05/04/2024)   Other Visit Diagnoses       BMI 40.0-44.9, adult (HCC)           Vitals Temp: 98.1 F (36.7 C) BP: 101/64 Pulse Rate: 87 SpO2: 100 %   Anthropometric Measurements Height: 5' 7 (1.702 m) Weight: 265 lb (120.2 kg) BMI (Calculated): 41.5 Weight at Last Visit: 264 lb Weight Lost Since Last Visit: 0 Weight Gained Since Last Visit: 1 Starting Weight: 268 lb Total Weight Loss (lbs): 3 lb (1.361 kg)   Body Composition  Body Fat %: 49.4 % Fat Mass (lbs): 131.4 lbs Muscle Mass (lbs): 127.6 lbs Total Body Water (lbs): 93 lbs Visceral Fat Rating : 13   Other Clinical Data Today's Visit #: 8 Starting Date: 10/13/23 Comments: Cat  3     ASSESSMENT AND PLAN: Assessment & Plan Other hyperlipidemia Recent LDL at 162 which is still significantly elevated.  We discussed importance of limiting saturated fats to less than 20% of total daily intake.  Will need repeat labs in February.  Patient is anticipating revision of her bariatric surgery prior to that. Vitamin D  deficiency Tolerating prescription strength vitamin D  and needs refill today.  No nausea, vomiting, muscle weakness reported.  Refill sent to pharmacy Morbid obesity (HCC)  BMI 40.0-44.9, adult (HCC)    Diet: [name] is currently in the action stage of change. As such, her goal is to continue with weight loss efforts and has agreed to the Category 3 Plan.   Exercise:  For substantial health benefits, adults should do at least 150 minutes (2 hours and 30 minutes) a week of moderate-intensity, or 75 minutes (1 hour and 15 minutes) a week of vigorous-intensity aerobic physical activity, or an equivalent combination of moderate- and vigorous-intensity aerobic activity. Aerobic activity should be performed in episodes of at least 10 minutes, and preferably, it should be spread throughout the week.  Behavior Modification:  We discussed the following Behavioral Modification Strategies today: increasing lean protein intake, decreasing simple carbohydrates, meal planning and cooking strategies, keeping healthy foods in the home, and holiday eating strategies.   Return in about 5 weeks (around 06/06/2024).   She was informed of the  importance of frequent follow up visits to maximize her success with intensive lifestyle modifications for her multiple health conditions.  Attestation Statements:   Reviewed by clinician on day of visit: allergies, medications, problem list, medical history, surgical history, family history, social history, and previous encounter notes.   Adelita Cho, MD

## 2024-05-03 ENCOUNTER — Ambulatory Visit (HOSPITAL_COMMUNITY): Admitting: Mental Health

## 2024-05-08 ENCOUNTER — Ambulatory Visit: Payer: Self-pay | Admitting: Family

## 2024-05-08 ENCOUNTER — Encounter: Payer: Self-pay | Admitting: Family

## 2024-05-08 VITALS — BP 110/70 | HR 91 | Temp 98.0°F | Resp 20 | Ht 67.0 in | Wt 274.6 lb

## 2024-05-08 DIAGNOSIS — G8929 Other chronic pain: Secondary | ICD-10-CM | POA: Diagnosis not present

## 2024-05-08 DIAGNOSIS — Z903 Acquired absence of stomach [part of]: Secondary | ICD-10-CM | POA: Diagnosis not present

## 2024-05-08 DIAGNOSIS — D509 Iron deficiency anemia, unspecified: Secondary | ICD-10-CM | POA: Diagnosis not present

## 2024-05-08 DIAGNOSIS — R7303 Prediabetes: Secondary | ICD-10-CM

## 2024-05-08 DIAGNOSIS — G43009 Migraine without aura, not intractable, without status migrainosus: Secondary | ICD-10-CM | POA: Diagnosis not present

## 2024-05-08 DIAGNOSIS — M5442 Lumbago with sciatica, left side: Secondary | ICD-10-CM

## 2024-05-08 MED ORDER — IBUPROFEN 800 MG PO TABS: 800.0000 mg | ORAL_TABLET | Freq: Three times a day (TID) | ORAL | 1 refills | Status: DC | PRN

## 2024-05-11 NOTE — Assessment & Plan Note (Signed)
 Recent LDL at 162 which is still significantly elevated.  We discussed importance of limiting saturated fats to less than 20% of total daily intake.  Will need repeat labs in February.  Patient is anticipating revision of her bariatric surgery prior to that.

## 2024-05-11 NOTE — Assessment & Plan Note (Signed)
 Tolerating prescription strength vitamin D  and needs refill today.  No nausea, vomiting, muscle weakness reported.  Refill sent to pharmacy

## 2024-05-12 NOTE — Progress Notes (Unsigned)
 SABRA

## 2024-05-22 ENCOUNTER — Encounter: Payer: Self-pay | Admitting: Dietician

## 2024-05-22 ENCOUNTER — Encounter: Attending: Surgery | Admitting: Dietician

## 2024-05-22 VITALS — Ht 66.0 in | Wt 273.5 lb

## 2024-05-22 DIAGNOSIS — E669 Obesity, unspecified: Secondary | ICD-10-CM | POA: Diagnosis present

## 2024-05-22 NOTE — Progress Notes (Signed)
 Pre-Operative Nutrition Class:    Class start Time: 0815   Class End Time: 0918  This was a class of 2 patients.   Patient was seen on 05/22/2024 for Pre-Operative Bariatric Surgery Education at [facility]    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  Anthropometrics  Start weight at [facility]: 270.2 lbs (date: 03/02/2024)  Height: 66 in Weight today: 273.5 lbs BMI: 44.14 kg/m2     Clinical  Medical hx: sleeve gastrectomy, anxiety, tumor Medications: DULoxetine  (CYMBALTA ) 30 MG capsule ibuprofen  (ADVIL ) 800 MG tablet Prenatal Vit-Fe Fumarate-FA (PRENATAL MULTIVITAMIN) TABS tablet rizatriptan  (MAXALT -MLT) 5 MG disintegrating tablet terbinafine  (LAMISIL ) 1 % cream topiramate  (TOPAMAX ) 25 MG tablet Vitamin D , Ergocalciferol , (DRISDOL ) 1.25 MG (50000 UNIT) CAPS capsule Labs: Vit D 15.2 Notable signs/symptoms: none noted Any previous deficiencies? No  Samples given per MNT protocol. Patient educated on appropriate usage: Celebrate Vitamins Multivitamin (high ADEK) Lot # [id number] Exp:10/2024  Celebrate Vitamins Calcium  Lot # [id number] Exp: 12/25  Ensure Max Protein Chocolate Shake Lot # [id number] Exp: 13 Jan 2025  The following the learning objectives were met by the patient during this course: Identify Pre-Op Dietary Goals and will begin 2 weeks pre-operatively Identify appropriate sources of fluids and proteins  State protein recommendations and appropriate sources pre and post-operatively Identify Post-Operative Dietary Goals and will follow for 2 weeks post-operatively Identify appropriate multivitamin, calcium, and thiamin sources Describe the need for physical activity post-operatively and will follow MD recommendations State when to call healthcare provider regarding medication questions or post-operative complications When having a diagnosis of diabetes understanding hypoglycemia symptoms and  the inclusion of 1 complex carbohydrate per meal  Handouts given during class include: Pre-Op Bariatric Surgery Diet Handout Protein Shake Handout Post-Op Bariatric Surgery Nutrition Handout BELT Program Information Flyer [employer] Information Flyer [facility] Bariatric Supplements Price List  Follow-Up Plan: Patient will follow-up at [facility] 2 weeks post operatively for diet advancement per MD.

## 2024-05-24 NOTE — BH Specialist Note (Signed)
 Integrated Behavioral Health via Telemedicine Visit  05/30/2024 Nancy Garrett [id number]  Number of Integrated Behavioral Health Clinician visits: 6-Sixth Visit  Session Start time: 1046   Session End time: 1114  Total time in minutes: 28  Referring Provider: Glenys Birk, MD Patient/Family location: Home [facility] Provider location: [facility] for [facility]  All persons participating in visit: [name] and [facility] Nancy Garrett   Types of Service: Individual psychotherapy and Video visit  I connected with Nancy Garrett and/or Nancy Garrett n/a via  Telephone or Video Enabled Telemedicine Application  (Video is Caregility application) and verified that I am speaking with the correct person using two identifiers. Discussed confidentiality: Yes   I discussed the limitations of telemedicine and the availability of in person appointments.  Discussed there is a possibility of technology failure and discussed alternative modes of communication if that failure occurs.  I discussed that engaging in this telemedicine visit, they consent to the provision of behavioral healthcare and the services will be billed under their insurance.  Patient and/or legal guardian expressed understanding and consented to Telemedicine visit: Yes   Presenting Concerns: Patient and/or family reports the following symptoms/concerns: Emotional meltdown, triggered by accidentally leaving purse with cash at a store, ending in several crying episodes throughout the day. 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  Duration of problem: Recent incidents; ongoing ; Severity of problem: moderate  Patient and/or Family's Strengths/Protective Factors: Social connections, Concrete supports in place (healthy food,  safe environments, etc.), Sense of purpose, and Physical Health (exercise, healthy diet, medication compliance, etc.)  Goals Addressed: Patient will:  Reduce symptoms of: anxiety and stress   Increase knowledge and/or ability of: stress reduction   Demonstrate ability to: Increase healthy adjustment to current life circumstances  Progress towards Goals: Ongoing  Interventions: Interventions utilized:  Supportive Reflection Standardized Assessments completed: Not Needed  Patient and/or Family Response: Patient agrees with treatment plan.  Clinical Assessment/Diagnosis  Adjustment disorder with anxious mood  Psychosocial stressors   Patient may benefit from continued therapeutic intervention.  Plan: Follow up with behavioral health clinician on :  -Three weeks Behavioral recommendations:  -Continue daily self-coping strategies and following medical recommendations to prepare for upcoming surgery -Continue plan to complete salon updates before the end of the year for peace of mind -Continue setting healthy emotional boundaries with others, especially with stressful extended family members/encounters Referral(s): Integrated [employer] (In Clinic)  I discussed the assessment and treatment plan with the patient and/or parent/guardian. They were provided an opportunity to ask questions and all were answered. They agreed with the plan and demonstrated an understanding of the instructions.   They were advised to call back or seek an in-person evaluation if the symptoms worsen or if the condition fails to improve as anticipated.  Marquavion Venhuizen C Margalit Leece, LCSW

## 2024-05-30 ENCOUNTER — Ambulatory Visit

## 2024-05-30 DIAGNOSIS — F4322 Adjustment disorder with anxiety: Secondary | ICD-10-CM

## 2024-05-30 DIAGNOSIS — Z658 Other specified problems related to psychosocial circumstances: Secondary | ICD-10-CM

## 2024-06-02 ENCOUNTER — Ambulatory Visit: Payer: Self-pay | Admitting: Surgery

## 2024-06-02 DIAGNOSIS — Z01818 Encounter for other preprocedural examination: Secondary | ICD-10-CM

## 2024-06-02 NOTE — Progress Notes (Signed)
 Sent message, via epic in basket, requesting orders in epic from Careers adviser.

## 2024-06-06 ENCOUNTER — Ambulatory Visit (INDEPENDENT_AMBULATORY_CARE_PROVIDER_SITE_OTHER): Admitting: Family Medicine

## 2024-06-06 ENCOUNTER — Encounter (INDEPENDENT_AMBULATORY_CARE_PROVIDER_SITE_OTHER): Payer: Self-pay | Admitting: Family Medicine

## 2024-06-06 VITALS — BP 111/70 | HR 82 | Temp 98.5°F | Ht 67.0 in | Wt 266.0 lb

## 2024-06-06 DIAGNOSIS — E559 Vitamin D deficiency, unspecified: Secondary | ICD-10-CM

## 2024-06-06 DIAGNOSIS — Z6841 Body Mass Index (BMI) 40.0 and over, adult: Secondary | ICD-10-CM

## 2024-06-06 DIAGNOSIS — R7303 Prediabetes: Secondary | ICD-10-CM

## 2024-06-06 NOTE — Progress Notes (Unsigned)
" ° °  SUBJECTIVE:  Chief Complaint: Obesity  Interim History: Patient mentions her revision surgery will be January 6th.  She starts the preop diet today in preparation for her surgery.  She was surprised the variety and quantity of foods she can have.  She did op for clear protein that she can add to her water.  She voices that the plan overall was notas hard as she was anticipating.  Overall she feels she has done well with getting all her food in on the meal plan.  She is able to more comfortably get food in.  She is anticipating either a 1-2 night hospital night stay.  She is also getting a cholecystectomy during the revision.    Denna is here to discuss her progress with her obesity treatment plan. She is on the Category 3 Plan and states she is following her eating plan approximately 90-95 % of the time. She states she is walking.   OBJECTIVE: Visit Diagnoses: Problem List Items Addressed This Visit       Other   Morbid obesity (HCC)   Vitamin D  deficiency   Prediabetes - Primary   Other Visit Diagnoses       BMI 40.0-44.9, adult (HCC)           Vitals Temp: 98.5 F (36.9 C) BP: 111/70 Pulse Rate: 82 SpO2: 100 %   Anthropometric Measurements Height: 5' 7 (1.702 m) Weight: 266 lb (120.7 kg) BMI (Calculated): 41.65 Weight at Last Visit: 265 lb Weight Lost Since Last Visit: 0 Weight Gained Since Last Visit: 1 Starting Weight: 268 lb Total Weight Loss (lbs): 2 lb (0.907 kg)   Body Composition  Body Fat %: 49.3 % Fat Mass (lbs): 131.6 lbs Muscle Mass (lbs): 128.4 lbs Total Body Water (lbs): 94.4 lbs Visceral Fat Rating : 13   Other Clinical Data Today's Visit #: 9 Starting Date: 10/13/23 Comments: Cat 3     ASSESSMENT AND PLAN:  Diet: Dashae is currently in the action stage of change. As such, her goal is to continue with weight loss efforts and has agreed to starting pre-op diet in anticipation for bariatric revision surgery.   Exercise:  All  adults should avoid inactivity. Some activity is better than none, and adults who participate in any amount of physical activity, gain some health benefits.  Behavior Modification:  We discussed the following Behavioral Modification Strategies today: increasing lean protein intake, decreasing simple carbohydrates, increasing vegetables, meal planning and cooking strategies, and planning for success.   Return in about 12 weeks (around 08/29/2024) for fasting labs and IC.   She was informed of the importance of frequent follow up visits to maximize her success with intensive lifestyle modifications for her multiple health conditions.  Attestation Statements:   Reviewed by clinician on day of visit: allergies, medications, problem list, medical history, surgical history, family history, social history, and previous encounter notes.     Adelita Cho, MD "

## 2024-06-10 NOTE — Patient Instructions (Signed)
 SURGICAL WAITING ROOM VISITATION Patients having surgery or a procedure may have no more than 2 support people in the waiting area - these visitors may rotate in the visitor waiting room.   If the patient needs to stay at the hospital during part of their recovery, the visitor guidelines for inpatient rooms apply.  PRE-OP VISITATION  Pre-op nurse will coordinate an appropriate time for 1 support person to accompany the patient in pre-op.  This support person may not rotate.  This visitor will be contacted when the time is appropriate for the visitor to come back in the pre-op area.  Temporary Visitor Restrictions   Children ages 61 and under will not be able to visit patients in [facility] under most circumstances. Visitation is not restricted outside of hospitals unless noted otherwise in the [facility] and Location Specific Visitation Guidelines at :       [email or web address]  Visitors with respiratory illnesses are discouraged from visiting and should remain at home.  You are not required to quarantine at this time prior to your surgery. However, you must do this: Hand Hygiene often Do NOT share personal items Notify your provider if you are in close contact with someone who has COVID or you develop fever 100.4 or greater, new onset of sneezing, cough, sore throat, shortness of breath or body aches.  If you test positive for Covid or have been in contact with anyone that has tested positive in the last 10 days please notify you surgeon.    Your procedure is scheduled on:  Tuesday  06-20-2024  Report to [facility] Main Entrance: [address] entrance where the [employer] is available.   Report to admitting at: 09:30    AM  Call this number if you have any questions or problems the morning of surgery [phone]  FOLLOW ANY ADDITIONAL PRE OP INSTRUCTIONS YOU RECEIVED FROM YOUR SURGEON'S OFFICE!!!  Do not eat food after Midnight the night prior to your  surgery/procedure.  After Midnight you may have the following liquids until  08:45 AM DAY OF SURGERY  Clear Liquid Diet Water Black Coffee (sugar ok, NO MILK/CREAM OR CREAMERS)  Tea (sugar ok, NO MILK/CREAM OR CREAMERS) regular and decaf                             Plain Jell-O  with no fruit (NO RED)                                           Fruit ices (not with fruit pulp, NO RED)                                     Popsicles (NO RED)                                                                  Juice: NO CITRUS JUICES: only apple, WHITE grape, WHITE cranberry Sports drinks like Gatorade or Powerade (NO RED)  Oral Hygiene is also important to reduce your risk of infection.        Remember - BRUSH YOUR TEETH THE MORNING OF SURGERY WITH YOUR REGULAR TOOTHPASTE  Do NOT smoke after Midnight the night before surgery.  STOP TAKING all Vitamins, Herbs and supplements 1 week before your surgery.   Take ONLY these medicines the morning of surgery with A SIP OF WATER: None   If You have been diagnosed with Sleep Apnea - Bring CPAP mask and tubing day of surgery. We will provide you with a CPAP machine on the day of your surgery.                   You may not have any metal on your body including hair pins, jewelry, and body piercing  Do not wear make-up, lotions, powders, perfumes or deodorant  Do not wear nail polish including gel and S&S, artificial / acrylic nails, or any other type of covering on natural nails including finger and toenails. If you have artificial nails, gel coating, etc., that needs to be removed by a nail salon, Please have this removed prior to surgery. Not doing so may mean that your surgery could be cancelled or delayed if the Surgeon or anesthesia staff feels like they are unable to monitor you safely.   Do not shave 48 hours prior to surgery to avoid nicks in your skin which may contribute to postoperative infections.   Contacts, Hearing Aids,  dentures or bridgework may not be worn into surgery. DENTURES WILL BE REMOVED PRIOR TO SURGERY PLEASE DO NOT APPLY Poly grip OR ADHESIVES!!!  You may bring a small overnight bag with you on the day of surgery, only pack items that are not valuable. [facility] IS NOT RESPONSIBLE   FOR VALUABLES THAT ARE LOST OR STOLEN.   Do not bring your home medications to the hospital. The Pharmacy will dispense medications listed on your medication list to you during your admission in the Hospital.  Please read over the following fact sheets you were given: IF YOU HAVE QUESTIONS ABOUT YOUR PRE-OP INSTRUCTIONS, PLEASE CALL [phone]   [facility] - Preparing for Surgery        Before surgery, you can play an important role.  Because skin is not sterile, your skin needs to be as free of germs as possible.  You can reduce the number of germs on your skin by washing with CHG (chlorahexidine gluconate) soap before surgery.  CHG is an antiseptic cleaner which kills germs and bonds with the skin to continue killing germs even after washing. Please DO NOT use if you have an allergy to CHG or antibacterial soaps.  If your skin becomes reddened/irritated stop using the CHG and inform your nurse when you arrive at Short Stay. Do not shave (including legs and underarms) for at least 48 hours prior to the first CHG shower.  You may shave your face/neck.  Please follow these instructions carefully:  1.  Shower with CHG Soap the night before surgery ONLY (DO NOT USE THE CHG SOAP THE MORNING OF SURGERY).  2.  If you choose to wash your hair, wash your hair first as usual with your normal  shampoo.  3.  After you shampoo, rinse your hair and body thoroughly to remove the shampoo.                             4.  Use  CHG as you would any other liquid soap.  You can apply chg directly to the skin and wash.  Gently with a scrungie or clean washcloth.  5.  Apply the CHG Soap to your body ONLY FROM THE NECK DOWN.   Do not use  on face/ open                           Wound or open sores. Avoid contact with eyes, ears mouth and genitals (private parts).                       Wash face,  Genitals (private parts) with your normal soap.             6.  Wash thoroughly, paying special attention to the area where your  surgery  will be performed.  7.  Thoroughly rinse your body with warm water from the neck down.  8.  DO NOT shower/wash with your normal soap after using and rinsing off the CHG Soap.                9.  Pat yourself dry with a clean towel.            10.  Wear clean pajamas.            11.  Place clean sheets on your bed the night of your first shower and do not  sleep with pets.  Day of Surgery : Do not apply any CHG, lotions/deodorants the morning of surgery.  Please wear clean clothes to the hospital/surgery center.   FAILURE TO FOLLOW THESE INSTRUCTIONS MAY RESULT IN THE CANCELLATION OF YOUR SURGERY  PATIENT SIGNATURE_________________________________  NURSE SIGNATURE__________________________________  ________________________________________________________________________

## 2024-06-10 NOTE — Progress Notes (Signed)
 COVID Vaccine received:  []  No []  Yes Date of any COVID positive Test in last 90 days:  PCP - Roxan Plough, NP  Cardiologist - Dub Huntsman, DO  Neurology- Modena Callander, MD   Chest x-ray - 03-06-2024  2v Epic EKG - 03-06-2024  Epic  Stress Test -  ECHO - 04-14-2022  Epic Cardiac Cath -  CT Coronary Calcium score:  Zio Monitor - 06-18-2023  Epic  Pacemaker / ICD device [x]  No []  Yes   Spinal Cord Stimulator:[x]  No []  Yes       History of Sleep Apnea? []  No []  Yes   CPAP used?- []  No []  Yes    Medication on DOS: none  Patient has: []  NO Hx DM   [x]  Pre-DM   []  DM1  []   DM2 Does the patient monitor blood sugar?   []  N/A   [x]  No []  Yes  Last A1c was:  6.0  on 04-03-2024        Blood Thinner / Instructions: none Aspirin  Instructions: none  Activity level: Able to walk up 2 flights of stairs without becoming significantly short of breath or having chest pain?   []    Yes   []  No,  would have:  Patient can perform ADLs without assistance.  []   Yes    []  No  Comments: Hx of gastric sleeve surgery in 2019 done in [place].   Anesthesia review: Pre-DM, Migraines, Anemia, Hx Syncope  ? Brain tumor / lesion- sees Neurology  Patient denies any S&S of respiratory illness or Covid - no shortness of breath, fever, cough or chest pain at PAT appointment.  Patient verbalized understanding and agreement to the Pre-Surgical Instructions that were given to them at this PAT appointment. Patient was also educated of the need to review these PAT instructions again prior to her surgery.I reviewed the appropriate phone numbers to call if they have any and questions or concerns.

## 2024-06-12 ENCOUNTER — Encounter (HOSPITAL_COMMUNITY)
Admission: RE | Admit: 2024-06-12 | Discharge: 2024-06-12 | Disposition: A | Discharge status: Home / Self Care | Source: Ambulatory Visit | Attending: Surgery | Admitting: Surgery

## 2024-06-12 ENCOUNTER — Other Ambulatory Visit: Payer: Self-pay

## 2024-06-12 ENCOUNTER — Encounter (HOSPITAL_COMMUNITY): Payer: Self-pay

## 2024-06-12 DIAGNOSIS — Z9884 Bariatric surgery status: Secondary | ICD-10-CM | POA: Diagnosis not present

## 2024-06-12 DIAGNOSIS — Z6841 Body Mass Index (BMI) 40.0 and over, adult: Secondary | ICD-10-CM | POA: Diagnosis not present

## 2024-06-12 DIAGNOSIS — Z01812 Encounter for preprocedural laboratory examination: Secondary | ICD-10-CM | POA: Insufficient documentation

## 2024-06-12 DIAGNOSIS — G9389 Other specified disorders of brain: Secondary | ICD-10-CM | POA: Diagnosis not present

## 2024-06-12 DIAGNOSIS — Z01818 Encounter for other preprocedural examination: Secondary | ICD-10-CM

## 2024-06-12 DIAGNOSIS — Z8659 Personal history of other mental and behavioral disorders: Secondary | ICD-10-CM | POA: Diagnosis not present

## 2024-06-12 DIAGNOSIS — D649 Anemia, unspecified: Secondary | ICD-10-CM | POA: Insufficient documentation

## 2024-06-12 HISTORY — DX: Prediabetes: R73.03

## 2024-06-12 LAB — COMPREHENSIVE METABOLIC PANEL WITH GFR
ALT: <5 U/L (ref 0–44)
AST: 11 U/L — ABNORMAL LOW (ref 15–41)
Albumin: 4.2 g/dL (ref 3.5–5.0)
Alkaline Phosphatase: 111 U/L (ref 38–126)
Anion gap: 9 (ref 5–15)
BUN: 12 mg/dL (ref 6–20)
CO2: 27 mmol/L (ref 22–32)
Calcium: 9.6 mg/dL (ref 8.9–10.3)
Chloride: 105 mmol/L (ref 98–111)
Creatinine, Ser: 0.65 mg/dL (ref 0.44–1.00)
GFR, Estimated: >60 mL/min
Glucose, Bld: 86 mg/dL (ref 70–99)
Potassium: 3.9 mmol/L (ref 3.5–5.1)
Sodium: 140 mmol/L (ref 135–145)
Total Bilirubin: 0.4 mg/dL (ref 0.0–1.2)
Total Protein: 7.3 g/dL (ref 6.5–8.1)

## 2024-06-12 LAB — CBC WITH DIFFERENTIAL/PLATELET
Abs Immature Granulocytes: 0.01 K/uL (ref 0.00–0.07)
Basophils Absolute: 0 K/uL (ref 0.0–0.1)
Basophils Relative: 0 %
Eosinophils Absolute: 0.1 K/uL (ref 0.0–0.5)
Eosinophils Relative: 1 %
HCT: 37.8 % (ref 36.0–46.0)
Hemoglobin: 11.5 g/dL — ABNORMAL LOW (ref 12.0–15.0)
Immature Granulocytes: 0 %
Lymphocytes Relative: 40 %
Lymphs Abs: 3 K/uL (ref 0.7–4.0)
MCH: 25.4 pg — ABNORMAL LOW (ref 26.0–34.0)
MCHC: 30.4 g/dL (ref 30.0–36.0)
MCV: 83.6 fL (ref 80.0–100.0)
Monocytes Absolute: 0.4 K/uL (ref 0.1–1.0)
Monocytes Relative: 5 %
Neutro Abs: 4.1 K/uL (ref 1.7–7.7)
Neutrophils Relative %: 54 %
Platelets: 236 K/uL (ref 150–400)
RBC: 4.52 MIL/uL (ref 3.87–5.11)
RDW: 17.8 % — ABNORMAL HIGH (ref 11.5–15.5)
WBC: 7.6 K/uL (ref 4.0–10.5)
nRBC: 0 % (ref 0.0–0.2)

## 2024-06-13 NOTE — Anesthesia Preprocedure Evaluation (Addendum)
 "                                  Anesthesia Evaluation  Patient identified by MRN, date of birth, ID band Patient awake    Reviewed: Allergy & Precautions, NPO status , Patient's Chart, lab work & pertinent test results  History of Anesthesia Complications Negative for: history of anesthetic complications  Airway Mallampati: II  TM Distance: >3 FB Neck ROM: Full    Dental no notable dental hx. (+) Teeth Intact, Dental Advisory Given   Pulmonary neg pulmonary ROS   Pulmonary exam normal breath sounds clear to auscultation       Cardiovascular (-) hypertension(-) angina (-) Past MI negative cardio ROS Normal cardiovascular exam Rhythm:Regular Rate:Normal     Neuro/Psych  Headaches  Anxiety        GI/Hepatic Neg liver ROS,GERD  ,,  Endo/Other    Class 3 obesity  Renal/GU negative Renal ROSLab Results      Component                Value               Date                          K                        3.9                 06/12/2024                          CREATININE               0.65                06/12/2024                    Musculoskeletal  (+) Arthritis ,    Abdominal   Peds  Hematology negative hematology ROS (+) Lab Results      Component                Value               Date                      WBC                      7.6                 06/12/2024                HGB                      11.5 (L)            06/12/2024                HCT                      37.8                06/12/2024                MCV  83.6                06/12/2024                PLT                      236                 06/12/2024              Anesthesia Other Findings All: Gadolinium contrast  Reproductive/Obstetrics                              Anesthesia Physical Anesthesia Plan  ASA: 3  Anesthesia Plan: MAC   Post-op Pain Management: Ketamine  IV*, Lidocaine  infusion*, Toradol  IV (intra-op)* and  Tylenol  PO (pre-op)*   Induction: Intravenous  PONV Risk Score and Plan: 4 or greater and Treatment may vary due to age or medical condition, Midazolam , Dexamethasone , Ondansetron  and Scopolamine  patch - Pre-op  Airway Management Planned: Oral ETT  Additional Equipment: None  Intra-op Plan:   Post-operative Plan: Extubation in OR  Informed Consent: I have reviewed the patients History and Physical, chart, labs and discussed the procedure including the risks, benefits and alternatives for the proposed anesthesia with the patient or authorized representative who has indicated his/her understanding and acceptance.     Dental advisory given  Plan Discussed with: CRNA and Surgeon  Anesthesia Plan Comments: (See PAT note from 12/29)         Anesthesia Quick Evaluation  "

## 2024-06-13 NOTE — Progress Notes (Signed)
 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  Patient follows with neurology for history of migraines with syncope versus seizure.  MRI of her brain in 11/2022 showed stable cystic lesions.  EEG without evidence of seizure.  Last seen by Dr. Onita on 02/28/24. Cymbalta  added for headache prevention. Advised f/u in 6 months.  Patient evaluated by cardiology for unexplained recurrent syncope.  Echo in 2023 showed LVEF 60 to 65%.  Heart monitoring in 2023 and 2024 without any arrhythmias. She was advised to undergo ILR if symptoms continued.  Seen by PCP on 05/08/24. All issues stable. She reports episodes of syncope about 1 x a month.  VS: BP 105/74 Comment: right arm sitting  Pulse 70   Temp 37.4 C (Oral)   Resp 20   Ht 5' 7 (1.702 m)   Wt 120.2 kg   LMP  (LMP Unknown)   SpO2 100%   BMI 41.50 kg/m   PROVIDERS: Ngetich, Dinah C, NP   LABS: Labs reviewed: Acceptable for surgery. (all labs ordered are listed, but only abnormal results are displayed)  Labs Reviewed  CBC WITH DIFFERENTIAL/PLATELET - Abnormal; Notable for the following components:      Result Value   Hemoglobin 11.5 (*)    MCH 25.4 (*)    RDW 17.8 (*)    All other components within normal limits  COMPREHENSIVE METABOLIC PANEL WITH GFR - Abnormal; Notable for the following components:   AST 11 (*)    All other components within normal limits  TYPE AND SCREEN    CT Chest/abd/pelvis 04/18/24:  IMPRESSION: 1. Prior gastric sleeve. No discrete hiatal hernia. 2. No acute abnormality in the  chest, abdomen or pelvis.   EKG 03/06/24:  NSR with sinus arrhythmia   Heart monitor 06/18/2023:  Patch Wear Time:  13 days and 12 hours   Patient had a min HR of 50 bpm, max HR of 180 bpm, and avg HR of 91 bpm.  Predominant underlying rhythm was Sinus Rhythm.  Less than 1% ventricular and supraventricular ectopy Patient triggered episodes associated with sinus rhythm and sinus tachycardia    Echo 04/14/2022:  IMPRESSIONS    1. Left ventricular ejection fraction, by estimation, is 60 to 65%. The left ventricle has normal function. The left ventricle has no regional wall motion abnormalities. Left ventricular diastolic parameters were normal.  2. Right ventricular systolic function is normal. The right ventricular size is normal. There is normal pulmonary artery systolic pressure.  3. Left atrial size was mildly dilated.  4. The mitral valve is abnormal. Trivial mitral valve regurgitation. No evidence of mitral stenosis.  5. The aortic valve is tricuspid. Aortic valve regurgitation is not visualized. No aortic stenosis is present.  6. The inferior vena cava is normal in size with greater than 50% respiratory variability, suggesting right atrial pressure of 3 mmHg.  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, stable    Past Surgical History:  Procedure Laterality Date   DILITATION & CURRETTAGE/HYSTROSCOPY WITH HYDROTHERMAL ABLATION N/A 04/06/2023   Procedure: DILATATION & CURETTAGE/HYSTEROSCOPY WITH HYDROTHERMAL ABLATION;  Surgeon: Fredirick Glenys RAMAN, MD;  Location: [facility] OR;  Service: Gynecology;  Laterality: N/A;   ESOPHAGOGASTRODUODENOSCOPY N/A 04/10/2024   Procedure: EGD (ESOPHAGOGASTRODUODENOSCOPY);  Surgeon: Lyndel Deward PARAS, MD;  Location:  [facility];  Service: General;  Laterality: N/A;   LAPAROSCOPIC BILATERAL SALPINGECTOMY Bilateral 04/06/2023   Procedure: LAPAROSCOPIC BILATERAL SALPINGECTOMY;  Surgeon: Fredirick Glenys RAMAN, MD;  Location: [facility] OR;  Service: Gynecology;  Laterality: Bilateral;   LAPAROSCOPIC GASTRIC SLEEVE RESECTION  08/24/2017   Dr. Unice Carpen. Done in [place]   TONSILLECTOMY     As a child   TUBAL LIGATION  03/2023    MEDICATIONS:  ibuprofen  (ADVIL ) 800 MG tablet   Vitamin D , Ergocalciferol , (DRISDOL ) 1.25 MG (50000 UNIT) CAPS capsule    terbinafine  (LAMISIL ) 1 % cream   Katie Faraone M Alera Quevedo, PA-C [facility] Short Stay/Anesthesiology [facility] Phone [phone] 06/13/2024 11:14 AM        "

## 2024-06-16 NOTE — BH Specialist Note (Signed)
 Less than 5 minutes by phone; agree to reschedule [facility] appointment, as pt is recovering from surgery in family home.

## 2024-06-20 ENCOUNTER — Encounter (HOSPITAL_COMMUNITY): Admission: RE | Disposition: A | Payer: Self-pay | Source: Home / Self Care | Attending: Surgery

## 2024-06-20 ENCOUNTER — Telehealth (HOSPITAL_COMMUNITY): Payer: Self-pay

## 2024-06-20 ENCOUNTER — Inpatient Hospital Stay (HOSPITAL_COMMUNITY): Admitting: Anesthesiology

## 2024-06-20 ENCOUNTER — Inpatient Hospital Stay (HOSPITAL_COMMUNITY)
Admission: RE | Admit: 2024-06-20 | Discharge: 2024-06-21 | DRG: 621 | Disposition: A | Discharge status: Home / Self Care | Attending: Surgery | Admitting: Surgery

## 2024-06-20 ENCOUNTER — Encounter (HOSPITAL_COMMUNITY): Payer: Self-pay | Admitting: Medical

## 2024-06-20 ENCOUNTER — Encounter (HOSPITAL_COMMUNITY): Payer: Self-pay | Admitting: Surgery

## 2024-06-20 ENCOUNTER — Other Ambulatory Visit (HOSPITAL_COMMUNITY): Payer: Self-pay

## 2024-06-20 ENCOUNTER — Other Ambulatory Visit: Payer: Self-pay

## 2024-06-20 DIAGNOSIS — Z6841 Body Mass Index (BMI) 40.0 and over, adult: Secondary | ICD-10-CM

## 2024-06-20 DIAGNOSIS — Z01818 Encounter for other preprocedural examination: Secondary | ICD-10-CM

## 2024-06-20 DIAGNOSIS — E785 Hyperlipidemia, unspecified: Secondary | ICD-10-CM

## 2024-06-20 DIAGNOSIS — K429 Umbilical hernia without obstruction or gangrene: Secondary | ICD-10-CM | POA: Diagnosis present

## 2024-06-20 DIAGNOSIS — Z79899 Other long term (current) drug therapy: Secondary | ICD-10-CM | POA: Diagnosis not present

## 2024-06-20 DIAGNOSIS — E559 Vitamin D deficiency, unspecified: Secondary | ICD-10-CM | POA: Diagnosis present

## 2024-06-20 DIAGNOSIS — K219 Gastro-esophageal reflux disease without esophagitis: Secondary | ICD-10-CM | POA: Diagnosis present

## 2024-06-20 DIAGNOSIS — F419 Anxiety disorder, unspecified: Secondary | ICD-10-CM | POA: Diagnosis not present

## 2024-06-20 DIAGNOSIS — Z8249 Family history of ischemic heart disease and other diseases of the circulatory system: Secondary | ICD-10-CM

## 2024-06-20 DIAGNOSIS — Z888 Allergy status to other drugs, medicaments and biological substances status: Secondary | ICD-10-CM

## 2024-06-20 DIAGNOSIS — Z833 Family history of diabetes mellitus: Secondary | ICD-10-CM | POA: Diagnosis not present

## 2024-06-20 DIAGNOSIS — G8918 Other acute postprocedural pain: Principal | ICD-10-CM

## 2024-06-20 DIAGNOSIS — Z823 Family history of stroke: Secondary | ICD-10-CM | POA: Diagnosis not present

## 2024-06-20 DIAGNOSIS — Z91041 Radiographic dye allergy status: Secondary | ICD-10-CM

## 2024-06-20 DIAGNOSIS — R7303 Prediabetes: Secondary | ICD-10-CM | POA: Diagnosis present

## 2024-06-20 HISTORY — PX: UPPER GI ENDOSCOPY: SHX6162

## 2024-06-20 HISTORY — PX: CHOLECYSTECTOMY: SHX55

## 2024-06-20 LAB — CBC
HCT: 34.5 % — ABNORMAL LOW (ref 36.0–46.0)
Hemoglobin: 11.2 g/dL — ABNORMAL LOW (ref 12.0–15.0)
MCH: 26 pg (ref 26.0–34.0)
MCHC: 32.5 g/dL (ref 30.0–36.0)
MCV: 80.2 fL (ref 80.0–100.0)
Platelets: 146 K/uL — ABNORMAL LOW (ref 150–400)
RBC: 4.3 MIL/uL (ref 3.87–5.11)
RDW: 18.1 % — ABNORMAL HIGH (ref 11.5–15.5)
WBC: 15.5 K/uL — ABNORMAL HIGH (ref 4.0–10.5)
nRBC: 0 % (ref 0.0–0.2)

## 2024-06-20 LAB — TYPE AND SCREEN
ABO/RH(D): O POS
Antibody Screen: NEGATIVE

## 2024-06-20 LAB — POCT PREGNANCY, URINE: Preg Test, Ur: NEGATIVE

## 2024-06-20 LAB — HEMOGLOBIN AND HEMATOCRIT, BLOOD
HCT: 36.5 % (ref 36.0–46.0)
Hemoglobin: 11.3 g/dL — ABNORMAL LOW (ref 12.0–15.0)

## 2024-06-20 LAB — CREATININE, SERUM
Creatinine, Ser: 0.73 mg/dL (ref 0.44–1.00)
GFR, Estimated: >60 mL/min

## 2024-06-20 SURGERY — GASTRECTOMY, ROBOT-ASSISTED, LAPAROSCOPIC
Anesthesia: Monitor Anesthesia Care | Site: Abdomen

## 2024-06-20 MED ORDER — KETOROLAC TROMETHAMINE 30 MG/ML IJ SOLN: 30.0000 mg | Freq: Once | INTRAMUSCULAR | Status: DC | PRN

## 2024-06-20 MED ORDER — ACETAMINOPHEN 500 MG PO TABS: 1000.0000 mg | ORAL_TABLET | Freq: Once | ORAL | Status: DC

## 2024-06-20 MED ORDER — ORAL CARE MOUTH RINSE: 15.0000 mL | Freq: Once | OROMUCOSAL | Status: AC

## 2024-06-20 MED ORDER — SCOPOLAMINE 1 MG/3DAYS TD PT72
MEDICATED_PATCH | TRANSDERMAL | Status: AC
  Filled 2024-06-20: qty 1

## 2024-06-20 MED ORDER — ACETAMINOPHEN 500 MG PO TABS
1000.0000 mg | ORAL_TABLET | ORAL | Status: AC
  Administered 2024-06-20: 1000 mg via ORAL
  Filled 2024-06-20: qty 2

## 2024-06-20 MED ORDER — BUPIVACAINE-EPINEPHRINE (PF) 0.25% -1:200000 IJ SOLN
INTRAMUSCULAR | Status: AC
  Filled 2024-06-20: qty 30

## 2024-06-20 MED ORDER — ONDANSETRON 4 MG PO TBDP: 4.0000 mg | ORAL_TABLET | Freq: Four times a day (QID) | ORAL | 0 refills | Status: AC | PRN

## 2024-06-20 MED ORDER — BUPIVACAINE-EPINEPHRINE 0.25% -1:200000 IJ SOLN
INTRAMUSCULAR | Status: DC | PRN
  Administered 2024-06-20: 60 mL

## 2024-06-20 MED ORDER — MIDAZOLAM HCL 2 MG/2ML IJ SOLN
INTRAMUSCULAR | Status: AC
  Filled 2024-06-20: qty 2

## 2024-06-20 MED ORDER — KETAMINE HCL 50 MG/5ML IJ SOSY
PREFILLED_SYRINGE | INTRAMUSCULAR | Status: AC
  Filled 2024-06-20: qty 5

## 2024-06-20 MED ORDER — FENTANYL CITRATE (PF) 100 MCG/2ML IJ SOLN
INTRAMUSCULAR | Status: AC
  Filled 2024-06-20: qty 2

## 2024-06-20 MED ORDER — ROCURONIUM BROMIDE 10 MG/ML (PF) SYRINGE
PREFILLED_SYRINGE | INTRAVENOUS | Status: DC | PRN
  Administered 2024-06-20: 20 mg via INTRAVENOUS
  Administered 2024-06-20 (×2): 10 mg via INTRAVENOUS
  Administered 2024-06-20: 70 mg via INTRAVENOUS

## 2024-06-20 MED ORDER — OXYCODONE HCL 5 MG PO TABS: 5.0000 mg | ORAL_TABLET | Freq: Four times a day (QID) | ORAL | 0 refills | Status: AC | PRN

## 2024-06-20 MED ORDER — DEXAMETHASONE SOD PHOSPHATE PF 10 MG/ML IJ SOLN
INTRAMUSCULAR | Status: DC | PRN
  Administered 2024-06-20: 10 mg via INTRAVENOUS

## 2024-06-20 MED ORDER — LIDOCAINE HCL (PF) 2 % IJ SOLN
INTRAMUSCULAR | Status: AC
  Filled 2024-06-20: qty 5

## 2024-06-20 MED ORDER — FENTANYL CITRATE (PF) 50 MCG/ML IJ SOSY
25.0000 ug | PREFILLED_SYRINGE | INTRAMUSCULAR | Status: DC | PRN
  Administered 2024-06-20 (×2): 50 ug via INTRAVENOUS

## 2024-06-20 MED ORDER — ONDANSETRON HCL 4 MG/2ML IJ SOLN
4.0000 mg | INTRAMUSCULAR | Status: DC | PRN
  Administered 2024-06-20 – 2024-06-21 (×2): 4 mg via INTRAVENOUS
  Filled 2024-06-20 (×2): qty 2

## 2024-06-20 MED ORDER — ONDANSETRON HCL 4 MG/2ML IJ SOLN: 4.0000 mg | Freq: Once | INTRAMUSCULAR | Status: DC | PRN

## 2024-06-20 MED ORDER — PANTOPRAZOLE SODIUM 40 MG IV SOLR
40.0000 mg | Freq: Every day | INTRAVENOUS | Status: DC
  Administered 2024-06-20: 40 mg via INTRAVENOUS
  Filled 2024-06-20: qty 10

## 2024-06-20 MED ORDER — SIMETHICONE 80 MG PO CHEW
80.0000 mg | CHEWABLE_TABLET | Freq: Four times a day (QID) | ORAL | Status: DC | PRN
  Administered 2024-06-20 – 2024-06-21 (×2): 80 mg via ORAL
  Filled 2024-06-20 (×2): qty 1

## 2024-06-20 MED ORDER — ENSURE MAX PROTEIN PO LIQD
2.0000 [oz_av] | ORAL | Status: DC
  Administered 2024-06-21 (×3): 2 [oz_av] via ORAL

## 2024-06-20 MED ORDER — SUGAMMADEX SODIUM 200 MG/2ML IV SOLN
INTRAVENOUS | Status: DC | PRN
  Administered 2024-06-20: 200 mg via INTRAVENOUS

## 2024-06-20 MED ORDER — ROCURONIUM BROMIDE 10 MG/ML (PF) SYRINGE
PREFILLED_SYRINGE | INTRAVENOUS | Status: AC
  Filled 2024-06-20: qty 10

## 2024-06-20 MED ORDER — CHLORHEXIDINE GLUCONATE CLOTH 2 % EX PADS: 6.0000 | MEDICATED_PAD | Freq: Once | CUTANEOUS | Status: DC

## 2024-06-20 MED ORDER — CHLORHEXIDINE GLUCONATE 0.12 % MT SOLN
15.0000 mL | Freq: Once | OROMUCOSAL | Status: AC
  Administered 2024-06-20: 15 mL via OROMUCOSAL

## 2024-06-20 MED ORDER — OXYCODONE HCL 5 MG/5ML PO SOLN: 5.0000 mg | Freq: Once | ORAL | Status: DC | PRN

## 2024-06-20 MED ORDER — LACTATED RINGERS IV SOLN: INTRAVENOUS | Status: DC

## 2024-06-20 MED ORDER — PANTOPRAZOLE SODIUM 40 MG PO TBEC: 40.0000 mg | DELAYED_RELEASE_TABLET | Freq: Every day | ORAL | 0 refills | Status: AC

## 2024-06-20 MED ORDER — APREPITANT 40 MG PO CAPS
40.0000 mg | ORAL_CAPSULE | ORAL | Status: AC
  Administered 2024-06-20: 40 mg via ORAL
  Filled 2024-06-20: qty 1

## 2024-06-20 MED ORDER — MORPHINE SULFATE (PF) 2 MG/ML IV SOLN: 1.0000 mg | INTRAVENOUS | Status: DC | PRN

## 2024-06-20 MED ORDER — ACETAMINOPHEN 500 MG PO TABS: 1000.0000 mg | ORAL_TABLET | Freq: Three times a day (TID) | ORAL | Status: AC

## 2024-06-20 MED ORDER — HEPARIN SODIUM (PORCINE) 5000 UNIT/ML IJ SOLN
5000.0000 [IU] | INTRAMUSCULAR | Status: AC
  Administered 2024-06-20: 5000 [IU] via SUBCUTANEOUS
  Filled 2024-06-20: qty 1

## 2024-06-20 MED ORDER — FENTANYL CITRATE (PF) 100 MCG/2ML IJ SOLN
INTRAMUSCULAR | Status: DC | PRN
  Administered 2024-06-20: 100 ug via INTRAVENOUS
  Administered 2024-06-20 (×3): 25 ug via INTRAVENOUS

## 2024-06-20 MED ORDER — SODIUM CHLORIDE 0.9 % IV SOLN
2.0000 g | INTRAVENOUS | Status: AC
  Administered 2024-06-20: 2 g via INTRAVENOUS
  Filled 2024-06-20: qty 2

## 2024-06-20 MED ORDER — MIDAZOLAM HCL 5 MG/5ML IJ SOLN
INTRAMUSCULAR | Status: DC | PRN
  Administered 2024-06-20: 2 mg via INTRAVENOUS

## 2024-06-20 MED ORDER — ACETAMINOPHEN 500 MG PO TABS
1000.0000 mg | ORAL_TABLET | Freq: Three times a day (TID) | ORAL | Status: DC
  Administered 2024-06-20 – 2024-06-21 (×3): 1000 mg via ORAL
  Filled 2024-06-20 (×3): qty 2

## 2024-06-20 MED ORDER — LACTATED RINGERS IR SOLN
Status: DC | PRN
  Administered 2024-06-20: 1000 mL

## 2024-06-20 MED ORDER — KETAMINE HCL 50 MG/5ML IJ SOSY
PREFILLED_SYRINGE | INTRAMUSCULAR | Status: DC | PRN
  Administered 2024-06-20 (×3): 10 mg via INTRAVENOUS

## 2024-06-20 MED ORDER — SCOPOLAMINE 1 MG/3DAYS TD PT72
MEDICATED_PATCH | TRANSDERMAL | Status: DC | PRN
  Administered 2024-06-20: 1 via TRANSDERMAL

## 2024-06-20 MED ORDER — HEPARIN SODIUM (PORCINE) 5000 UNIT/ML IJ SOLN
5000.0000 [IU] | Freq: Three times a day (TID) | INTRAMUSCULAR | Status: DC
  Administered 2024-06-20 – 2024-06-21 (×2): 5000 [IU] via SUBCUTANEOUS
  Filled 2024-06-20 (×2): qty 1

## 2024-06-20 MED ORDER — ONDANSETRON HCL 4 MG/2ML IJ SOLN
INTRAMUSCULAR | Status: AC
  Filled 2024-06-20: qty 2

## 2024-06-20 MED ORDER — GABAPENTIN 100 MG PO CAPS: 100.0000 mg | ORAL_CAPSULE | Freq: Two times a day (BID) | ORAL | 0 refills | Status: AC

## 2024-06-20 MED ORDER — LIDOCAINE HCL (PF) 2 % IJ SOLN
INTRAMUSCULAR | Status: DC | PRN
  Administered 2024-06-20: 100 mg via INTRADERMAL
  Administered 2024-06-20: 1.5 mg/kg/h via INTRADERMAL

## 2024-06-20 MED ORDER — FENTANYL CITRATE (PF) 50 MCG/ML IJ SOSY
PREFILLED_SYRINGE | INTRAMUSCULAR | Status: AC
  Filled 2024-06-20: qty 2

## 2024-06-20 MED ORDER — SUGAMMADEX SODIUM 200 MG/2ML IV SOLN
INTRAVENOUS | Status: AC
  Filled 2024-06-20: qty 2

## 2024-06-20 MED ORDER — ACETAMINOPHEN 160 MG/5ML PO SOLN: 1000.0000 mg | Freq: Three times a day (TID) | ORAL | Status: DC

## 2024-06-20 MED ORDER — PROPOFOL 10 MG/ML IV BOLUS
INTRAVENOUS | Status: AC
  Filled 2024-06-20: qty 20

## 2024-06-20 MED ORDER — ONDANSETRON HCL 4 MG/2ML IJ SOLN
INTRAMUSCULAR | Status: DC | PRN
  Administered 2024-06-20: 4 mg via INTRAVENOUS

## 2024-06-20 MED ORDER — OXYCODONE HCL 5 MG PO TABS: 5.0000 mg | ORAL_TABLET | Freq: Once | ORAL | Status: DC | PRN

## 2024-06-20 MED ORDER — OXYCODONE HCL 5 MG/5ML PO SOLN
5.0000 mg | Freq: Four times a day (QID) | ORAL | Status: DC | PRN
  Administered 2024-06-20 – 2024-06-21 (×3): 5 mg via ORAL
  Filled 2024-06-20 (×3): qty 5

## 2024-06-20 MED ORDER — PROPOFOL 10 MG/ML IV BOLUS
INTRAVENOUS | Status: DC | PRN
  Administered 2024-06-20: 170 mg via INTRAVENOUS

## 2024-06-20 MED ORDER — LIDOCAINE HCL 2 % IJ SOLN
INTRAMUSCULAR | Status: AC
  Filled 2024-06-20: qty 20

## 2024-06-20 SURGICAL SUPPLY — 67 items
BAG COUNTER SPONGE SURGICOUNT (BAG) ×2 IMPLANT
BLADE SURG SZ11 CARB STEEL (BLADE) ×2 IMPLANT
CANNULA REDUCER 12-8 DVNC XI (CANNULA) ×2 IMPLANT
CATH URETL OPEN 5X70 (CATHETERS) IMPLANT
CHLORAPREP W/TINT 26 (MISCELLANEOUS) ×4 IMPLANT
CLIP APPLIE ROT 10 11.4 M/L (STAPLE) ×2 IMPLANT
COVER MAYO STAND XLG (MISCELLANEOUS) ×2 IMPLANT
COVER SURGICAL LIGHT HANDLE (MISCELLANEOUS) ×2 IMPLANT
COVER TIP SHEARS 8 DVNC (MISCELLANEOUS) IMPLANT
DEFOGGER SCOPE WARM SEASHARP (MISCELLANEOUS) IMPLANT
DERMABOND ADVANCED .7 DNX12 (GAUZE/BANDAGES/DRESSINGS) ×2 IMPLANT
DRAPE ARM DVNC X/XI (DISPOSABLE) ×8 IMPLANT
DRAPE C-ARM 42X120 X-RAY (DRAPES) IMPLANT
DRAPE COLUMN DVNC XI (DISPOSABLE) ×2 IMPLANT
DRIVER NDLE MEGA SUTCUT DVNCXI (INSTRUMENTS) ×2 IMPLANT
ELECT REM PT RETURN 15FT ADLT (MISCELLANEOUS) ×2 IMPLANT
ENDOLOOP SUT PDS II 0 18 (SUTURE) ×2 IMPLANT
GAUZE 4X4 16PLY ~~LOC~~+RFID DBL (SPONGE) ×2 IMPLANT
GLOVE BIO SURGEON STRL SZ7.5 (GLOVE) ×4 IMPLANT
GLOVE INDICATOR 8.0 STRL GRN (GLOVE) ×4 IMPLANT
GOWN STRL REUS W/ TWL XL LVL3 (GOWN DISPOSABLE) ×4 IMPLANT
GRASPER SUT TROCAR 14GX15 (MISCELLANEOUS) ×2 IMPLANT
GRASPER TIP-UP FEN DVNC XI (INSTRUMENTS) ×2 IMPLANT
HEMOSTAT SNOW SURGICEL 2X4 (HEMOSTASIS) IMPLANT
IRRIGATION SUCT STRKRFLW 2 WTP (MISCELLANEOUS) ×2 IMPLANT
IV CATH 14GX2 1/4 (CATHETERS) ×2 IMPLANT
KIT BASIN OR (CUSTOM PROCEDURE TRAY) ×2 IMPLANT
KIT IMAGING PINPOINTPAQ (MISCELLANEOUS) IMPLANT
KIT TURNOVER KIT A (KITS) ×2 IMPLANT
LUBRICANT JELLY K Y 4OZ (MISCELLANEOUS) IMPLANT
MARKER SKIN DUAL TIP RULER LAB (MISCELLANEOUS) IMPLANT
MAT PREVALON FULL STRYKER (MISCELLANEOUS) ×2 IMPLANT
NEEDLE INSUFFLATION 14GA 120MM (NEEDLE) ×2 IMPLANT
NEEDLE SPNL 18GX3.5 QUINCKE PK (NEEDLE) ×2 IMPLANT
OBTURATOR OPTICALSTD 8 DVNC (TROCAR) ×2 IMPLANT
PACK CARDIOVASCULAR III (CUSTOM PROCEDURE TRAY) ×2 IMPLANT
POUCH RETRIEVAL ECOSAC 10 (ENDOMECHANICALS) ×2 IMPLANT
RELOAD STAPLE 60 2.5 WHT DVNC (STAPLE) IMPLANT
RELOAD STAPLE 60 3.5 BLU DVNC (STAPLE) IMPLANT
RELOAD STAPLE 60 4.3 GRN DVNC (STAPLE) IMPLANT
SCISSORS LAP 5X35 DISP (ENDOMECHANICALS) ×2 IMPLANT
SCISSORS MNPLR CVD DVNC XI (INSTRUMENTS) ×2 IMPLANT
SEAL UNIV 5-12 XI (MISCELLANEOUS) ×8 IMPLANT
SEALER VESSEL EXT DVNC XI (MISCELLANEOUS) ×2 IMPLANT
SET TUBE SMOKE EVAC HIGH FLOW (TUBING) ×2 IMPLANT
SLEEVE GASTRECTOMY 40FR VISIGI (MISCELLANEOUS) IMPLANT
SLEEVE Z-THREAD 5X100MM (TROCAR) ×4 IMPLANT
SOLUTION ANTFG W/FOAM PAD STRL (MISCELLANEOUS) ×2 IMPLANT
SOLUTION ELECTROSURG ANTI STCK (MISCELLANEOUS) ×2 IMPLANT
SPIKE FLUID TRANSFER (MISCELLANEOUS) ×2 IMPLANT
STAPLER 60 SUREFORM DVNC (STAPLE) ×2 IMPLANT
STOPCOCK 4 WAY LG BORE MALE ST (IV SETS) IMPLANT
SUT MNCRL AB 4-0 PS2 18 (SUTURE) ×4 IMPLANT
SUT SILK 0 SH 30 (SUTURE) IMPLANT
SUT SILK 2 0 SH (SUTURE) IMPLANT
SUT VIC AB 0 CT1 27XBRD ANTBC (SUTURE) ×2 IMPLANT
SUT VIC AB 2-0 SH 27XBRD (SUTURE) ×2 IMPLANT
SUT VICRYL 0 UR6 27IN ABS (SUTURE) ×2 IMPLANT
SUTURE V-LC BRB 180 2/0GR6GS22 (SUTURE) IMPLANT
SUTURE VLOC BRB 180 ABS3/0GR12 (SUTURE) IMPLANT
SYR 20ML LL LF (SYRINGE) ×2 IMPLANT
TOWEL OR DSP ST BLU DLX 10/PK (DISPOSABLE) ×2 IMPLANT
TRAY FOLEY MTR SLVR 16FR STAT (SET/KITS/TRAYS/PACK) IMPLANT
TRAY LAPAROSCOPIC (CUSTOM PROCEDURE TRAY) ×2 IMPLANT
TROCAR ADV FIXATION 12X100MM (TROCAR) ×2 IMPLANT
TROCAR Z-THREAD FIOS 5X100MM (TROCAR) ×2 IMPLANT
TROCAR Z-THREAD OPTICAL 5X100M (TROCAR) ×2 IMPLANT

## 2024-06-20 NOTE — H&P (Signed)
 "  Admitting Physician: Deward PARAS Bryttani Blew  Service: Bariatric surgery  CC: Obesity  Subjective   HPI: Nancy Garrett is an 34 y.o. 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, stable    Past Surgical History:  Procedure Laterality Date   DILITATION & CURRETTAGE/HYSTROSCOPY WITH HYDROTHERMAL ABLATION N/A 04/06/2023   Procedure: DILATATION & CURETTAGE/HYSTEROSCOPY WITH HYDROTHERMAL ABLATION;  Surgeon: Fredirick Glenys RAMAN, MD;  Location: [facility] OR;  Service: Gynecology;  Laterality: N/A;   ESOPHAGOGASTRODUODENOSCOPY N/A 04/10/2024   Procedure: EGD (ESOPHAGOGASTRODUODENOSCOPY);  Surgeon: Lyndel Deward PARAS, MD;  Location: [facility];  Service: General;  Laterality: N/A;   LAPAROSCOPIC BILATERAL SALPINGECTOMY Bilateral 04/06/2023   Procedure: LAPAROSCOPIC BILATERAL SALPINGECTOMY;  Surgeon: Fredirick Glenys RAMAN, MD;  Location: [facility] OR;  Service: Gynecology;  Laterality: Bilateral;   LAPAROSCOPIC GASTRIC SLEEVE RESECTION  08/24/2017   Dr. Unice Carpen. Done in [place]   TONSILLECTOMY     As a child   TUBAL LIGATION  03/2023    Family History  Problem Relation Age of Onset   Hypertension Mother    High blood pressure Mother    Diabetes Father    Diabetes Sister    Stroke Sister    High blood pressure Sister     Social:  reports that she has never smoked. She has never been exposed to tobacco smoke. She has never used smokeless tobacco. She reports that she does not currently use alcohol. She reports that she does not use drugs.  Allergies: Allergies[1]  Medications: Current Outpatient Medications  Medication Instructions   ibuprofen  (ADVIL ) 800 mg, Oral, Every 8 hours PRN   Vitamin D  (Ergocalciferol ) (DRISDOL ) 50,000  Units, Oral, 2 times weekly    ROS - all of the below systems have been reviewed with the patient and positives are indicated with bold text General: chills, fever or night sweats Eyes: blurry vision or double vision ENT: epistaxis or sore throat Allergy/Immunology: itchy/watery eyes or nasal congestion Hematologic/Lymphatic: bleeding problems, blood clots or swollen lymph nodes Endocrine: temperature intolerance or unexpected weight changes Breast: new or changing breast lumps or nipple discharge Resp: cough, shortness of breath, or wheezing CV: chest pain or dyspnea on exertion GI: as per HPI GU: dysuria, trouble voiding, or hematuria MSK: joint pain or joint stiffness Neuro: TIA or stroke symptoms Derm: pruritus and skin lesion changes Psych: anxiety and depression  Objective   PE Blood pressure 113/77, pulse 84, temperature 99.3 F (37.4 C), temperature source Oral, resp. rate 16, height 5' 7 (1.702 m), weight 119 kg, SpO2 99%, not currently breastfeeding. Constitutional: NAD; conversant; no deformities Eyes: Moist conjunctiva; no lid lag; anicteric; PERRL Neck: Trachea midline; no thyromegaly Lungs: Normal respiratory effort; no tactile fremitus CV: RRR; no palpable thrills; no pitting edema GI: Abd Soft, nontender; no palpable hepatosplenomegaly MSK: Normal range of motion of extremities; no clubbing/cyanosis Psychiatric: Appropriate affect; alert and oriented x3 Lymphatic: No palpable cervical or axillary lymphadenopathy  Results for orders placed or performed during the hospital encounter of 06/20/24 (from the past 24 hours)  Pregnancy, urine POC     Status:  None   Collection Time: 06/20/24  5:56 AM  Result Value Ref Range   Preg Test, Ur NEGATIVE NEGATIVE    Imaging Orders  No imaging studies ordered today     Assessment and Plan   Nancy Garrett is an 34 y.o. female with obesity, history of sleeve gastrectomy.  I recommended conversion from sleeve  gastrectomy to robotic Single Anastomosis DuodenoIleostomy with Sleeve gastrectomy (SADI-S), cholecystectomy and upper endoscopy.  We discussed the procedure, its risks, benefits and alternatives on multiple occasions.  She completed the preoperative pathway.  The patient granted consent to proceed.  We will proceed today as scheduled.    ICD-10-CM   1. Preoperative testing  Z01.818 Pregnancy, urine STAT morning of surgery    Pregnancy, urine STAT morning of surgery    CANCELED: Pregnancy, urine URGENT morning of surgery    CANCELED: Pregnancy, urine URGENT morning of surgery       Deward JINNY Foy, MD  [facility] Use [email or web address] to contact on call provider       [1]  Allergies Allergen Reactions   Gadolinium Hives, Itching and Nausea And Vomiting    Pt reports she has allergy to MRI contrast not CT contrast. AV, RN 11/24/22   Iodinated Contrast Media    Ivp Dye [Iodinated Contrast Media] Nausea And Vomiting   "

## 2024-06-20 NOTE — Anesthesia Procedure Notes (Signed)
 Procedure Name: Intubation Date/Time: 06/20/2024 7:41 AM  Performed by: Memory Armida LABOR, CRNAPre-anesthesia Checklist: Patient identified, Emergency Drugs available, Suction available, Patient being monitored and Timeout performed Patient Re-evaluated:Patient Re-evaluated prior to induction Oxygen Delivery Method: Circle system utilized Preoxygenation: Pre-oxygenation with 100% oxygen Induction Type: IV induction Ventilation: Mask ventilation without difficulty Laryngoscope Size: Mac and 4 Grade View: Grade II Tube type: Oral Tube size: 7.5 mm Number of attempts: 2 Airway Equipment and Method: Patient positioned with wedge pillow Placement Confirmation: ETT inserted through vocal cords under direct vision, positive ETCO2 and breath sounds checked- equal and bilateral Secured at: 22 cm Tube secured with: Tape Dental Injury: Teeth and Oropharynx as per pre-operative assessment  Comments: Smooth IV induction. Easy mask. DL X 1 with MAC 4. Grade 2-3 view by CRNA. - ETCO2. ETT removed. Easy mask. VSS. DL X 1 with MAC 4 by Dr Jefm. Grade 2 view. BBS= +ETCO2. ATOI. ETT secured at 22 cm at the lip

## 2024-06-20 NOTE — Transfer of Care (Signed)
 Immediate Anesthesia Transfer of Care Note  Patient: Glendora Clouatre  Procedure(s) Performed: GASTRECTOMY, ROBOT-ASSISTED, LAPAROSCOPIC (Abdomen) ENDOSCOPY, UPPER GI TRACT LAPAROSCOPIC CHOLECYSTECTOMY  Patient Location: PACU  Anesthesia Type:General  Level of Consciousness: drowsy  Airway & Oxygen Therapy: Patient Spontanous Breathing and Patient connected to face mask oxygen  Post-op Assessment: Report given to RN, Post -op Vital signs reviewed and stable, and Patient moving all extremities  Post vital signs: Reviewed and stable  Last Vitals:  Vitals Value Taken Time  BP 144/98 06/20/24 10:36  Temp    Pulse 87 06/20/24 10:39  Resp 18 06/20/24 10:39  SpO2 100 % 06/20/24 10:39  Vitals shown include unfiled device data.  Last Pain:  Vitals:   06/20/24 0629  TempSrc: Oral  PainSc: 0-No pain      Patients Stated Pain Goal: 5 (06/20/24 9375)  Complications: No notable events documented.

## 2024-06-20 NOTE — Telephone Encounter (Signed)
 Pharmacy Patient Advocate Encounter  Insurance verification completed.    The patient is insured through [employer] [place].     Ran test claim for Enoxaparin  40mg /0.23ml and the current 14 day co-pay is $4.   This test claim was processed through [employer]- copay amounts may vary at other pharmacies due to [employer], or as the patient moves through the different stages of their insurance plan.

## 2024-06-20 NOTE — Progress Notes (Signed)

## 2024-06-20 NOTE — Discharge Instructions (Signed)
 BARIATRIC SURGERY Home Care Instructions  These instructions are to help you care for yourself when you go home.  Call: If you have any problems. Call [phone] and ask for the surgeon on call If you have an emergency related to your surgery please use the ER at [facility].  Tell the ER staff that you are a new post-op gastric bypass or gastric sleeve patient   Signs and symptoms to report: Severe vomiting or nausea If you cannot handle clear liquids for longer than 1 day, call your surgeon  Abdominal pain which does not get better after taking your pain medication Fever greater than 100.4 F and chills Heart rate over 100 beats a minute Trouble breathing Chest pain  Redness, swelling, drainage, or foul odor at incision (surgical) sites  If your incisions open or pull apart Swelling or pain in calf (lower leg) Diarrhea (Loose bowel movements that happen often), frequent watery, uncontrolled bowel movements Constipation, (no bowel movements for 3 days) if this happens:  Take Milk of Magnesia, 2 tablespoons by mouth, 3 times a day for 2 days if needed Stop taking Milk of Magnesia once you have had a bowel movement Call your doctor if constipation continues Or Take Miralax  (instead of Milk of Magnesia) following the label instructions Stop taking Miralax once you have had a bowel movement Call your doctor if constipation continues Anything you think is abnormal for you   Normal side effects after surgery: Unable to sleep at night or unable to concentrate Irritability Being tearful (crying) or depressed These are common complaints, possibly related to your anesthesia, stress of surgery and change in lifestyle, that usually go away a few weeks after surgery.  If these feelings continue, call your medical doctor.  Wound Care: You may have surgical glue, steri-strips, or staples over your incisions after surgery Surgical glue:  Looks like a clear film over your incisions and will  wear off a little at a time Steri-strips : Adhesive strips of tape over your incisions. You may notice a yellowish color on the skin under the steri-strips. This is used to make the   steri-strips stick better. Do not pull the steri-strips off - let them fall off Staples: Staples may be removed before you leave the hospital If you go home with staples, call [facility] at for an appointment with your surgeons nurse to have staples removed 10 days after surgery, [phone] Showering: You may shower two (2) days after your surgery unless your surgeon tells you differently Wash gently around incisions with warm soapy water, rinse well, and gently pat dry  If you have a drain (tube from your incision), you may need someone to hold this while you shower  No tub baths until staples are removed and incisions are healed     Medications: Medications should be liquid or crushed if larger than the size of a dime Extended release pills (medication that releases a little bit at a time through the day) should not be crushed Depending on the size and number of medications you take, you may need to space (take a few throughout the day)/change the time you take your medications so that you do not over-fill your pouch (smaller stomach) Make sure you follow-up with your primary care physician to make medication changes needed during rapid weight loss and life-style changes If you have diabetes, follow up with the doctor that orders your diabetes medication(s) within one week after surgery and check your blood sugar  regularly. Do not drive while taking narcotics (pain medications) DO NOT take NSAID'S (Examples of NSAID's include ibuprofen , naproxen)  Diet:                    First 2 Weeks  You will see the nutritionist about two (2) weeks after your surgery. The nutritionist will increase the types of foods you can eat if you are handling liquids well: If you have severe vomiting or nausea and cannot  handle clear liquids lasting longer than 1 day, call your surgeon  Protein Shake Drink at least 2 ounces of shake 5-6 times per day Each serving of protein shakes (usually 8 - 12 ounces) should have a minimum of:  15 grams of protein  And no more than 5 grams of carbohydrate  Goal for protein each day: 80-100 g protein per day Protein powder may be added to fluids such as non-fat milk or Lactaid milk or Soy milk (limit to 35 grams added protein powder per serving)  Hydration Slowly increase the amount of water and other clear liquids as tolerated (See Acceptable Fluids) Slowly increase the amount of protein shake as tolerated   Sip fluids slowly and throughout the day May use sugar substitutes in small amounts (no more than 6 - 8 packets per day; i.e. Splenda)  Fluid Goal The first goal is to drink at least 8 ounces of protein shake/drink per day (or as directed by the nutritionist);  See handout from pre-op Bariatric Education Class for examples of protein shake/drink.   Slowly increase the amount of protein shake you drink as tolerated You may find it easier to slowly sip shakes throughout the day It is important to get your proteins in first Your fluid goal is to drink 64 - 100 ounces of fluid daily It may take a few weeks to build up to this 32 oz (or more) should be clear liquids  And  32 oz (or more) should be full liquids (see below for examples) Liquids should not contain sugar, caffeine, or carbonation  Clear Liquids: Water or Sugar-free flavored water (i.e. Fruit H2O, Propel) Decaffeinated coffee or tea (sugar-free) Crystal Lite, Wylers Lite, Minute Maid Lite Sugar-free Jell-O Bouillon or broth Sugar-free Popsicle:   *Less than 20 calories each; Limit 1 per day  Full Liquids: Protein Shakes/Drinks + 2 choices per day of other full liquids Full liquids must be: No More Than 12 grams of Carbs per serving  No More Than 3 grams of Fat per serving Strained low-fat cream  soup Non-Fat milk Fat-free Lactaid Milk Sugar-free yogurt (Dannon Lite & Fit, Greek yogurt)      Vitamins and Minerals Start 1 day after surgery unless otherwise directed by your surgeon Bariatric Specific Complete Multivitamins Chewable Calcium Citrate with Vitamin D -3 (Example: 3 Chewable Calcium Plus 600 with Vitamin D -3) Take 500 mg three (3) times a day for a total of 1500 mg each day Do not take all 3 doses of calcium at one time as it may cause constipation, and you can only absorb 500 mg  at a time  Do not mix multivitamins containing iron with calcium supplements; take 2 hours apart  Menstruating women and those at risk for anemia (a blood disease that causes weakness) may need extra iron Talk with your doctor to see if you need more iron If you need extra iron: Total daily Iron recommendation (including Vitamins) is 50 to 100 mg Iron/day Do not stop taking or change any vitamins  or minerals until you talk to your nutritionist or surgeon Your nutritionist and/or surgeon must approve all vitamin and mineral supplements   Activity and Exercise: It is important to continue walking at home.  Limit your physical activity as instructed by your doctor.  During this time, use these guidelines: Do not lift anything greater than ten (10) pounds for at least two (2) weeks Do not go back to work or drive until designer, industrial/product says you can You may have sex when you feel comfortable  It is VERY important for female patients to use a reliable birth control method; fertility often increases after surgery  Do not get pregnant for at least 18 months Start exercising as soon as your doctor tells you that you can Make sure your doctor approves any physical activity Start with a simple walking program Walk 5-15 minutes each day, 7 days per week.  Slowly increase until you are walking 30-45 minutes per day Consider joining our BELT program.  email [email or web address]   Special Instructions Things to  remember:  Use your CPAP when sleeping if this applies to you, do not stop the use of CPAP unless directed by physician after a sleep study [facility] has a free Bariatric Surgery Support Group that meets monthly, the 3rd Thursday, 6 pm.  Please review discharge information for date and location of this meeting. It is very important to keep all follow up appointments with your surgeon, nutritionist, primary care physician, and behavioral health practitioner After the first year, please follow up with your bariatric surgeon and nutritionist at least once a year in order to maintain best weight loss results   [facility]: [phone] [facility]: [phone] Bariatric Nurse Coordinator: [phone]

## 2024-06-20 NOTE — Plan of Care (Signed)
" °  Problem: Education: Goal: Knowledge of the prescribed therapeutic regimen will improve Outcome: Progressing   Problem: Bowel/Gastric: Goal: Gastrointestinal status for postoperative course will improve Outcome: Progressing   Problem: Cardiac: Goal: Ability to maintain an adequate cardiac output Outcome: Progressing Goal: Will show no evidence of cardiac arrhythmias Outcome: Progressing   Problem: Nutritional: Goal: Will attain and maintain optimal nutritional status Outcome: Progressing   Problem: Neurological: Goal: Will regain or maintain usual level of consciousness Outcome: Progressing   Problem: Clinical Measurements: Goal: Ability to maintain clinical measurements within normal limits Outcome: Progressing Goal: Postoperative complications will be avoided or minimized Outcome: Progressing   Problem: Respiratory: Goal: Will regain and/or maintain adequate ventilation Outcome: Progressing Goal: Respiratory status will improve Outcome: Progressing   Problem: Skin Integrity: Goal: Demonstrates signs of wound healing without infection Outcome: Progressing   Problem: Urinary Elimination: Goal: Will remain free from infection Outcome: Progressing Goal: Ability to achieve and maintain adequate urine output Outcome: Progressing   Problem: Education: Goal: Knowledge of General Education information will improve Description: Including pain rating scale, medication(s)/side effects and non-pharmacologic comfort measures Outcome: Progressing   Problem: Health Behavior/Discharge Planning: Goal: Ability to manage health-related needs will improve Outcome: Progressing   Problem: Clinical Measurements: Goal: Ability to maintain clinical measurements within normal limits will improve Outcome: Progressing Goal: Will remain free from infection Outcome: Progressing Goal: Diagnostic test results will improve Outcome: Progressing Goal: Respiratory complications will  improve Outcome: Progressing Goal: Cardiovascular complication will be avoided Outcome: Progressing   Problem: Activity: Goal: Risk for activity intolerance will decrease Outcome: Progressing   Problem: Nutrition: Goal: Adequate nutrition will be maintained Outcome: Progressing   Problem: Coping: Goal: Level of anxiety will decrease Outcome: Progressing   Problem: Elimination: Goal: Will not experience complications related to bowel motility Outcome: Progressing Goal: Will not experience complications related to urinary retention Outcome: Progressing   Problem: Pain Managment: Goal: General experience of comfort will improve and/or be controlled Outcome: Progressing   Problem: Safety: Goal: Ability to remain free from injury will improve Outcome: Progressing   Problem: Skin Integrity: Goal: Risk for impaired skin integrity will decrease Outcome: Progressing   Problem: Education: Goal: Ability to state signs and symptoms to report to health care provider will improve Outcome: Progressing Goal: Knowledge of the prescribed self-care regimen will improve Outcome: Progressing Goal: Knowledge of discharge needs will improve Outcome: Progressing   Problem: Activity: Goal: Ability to tolerate increased activity will improve Outcome: Progressing   Problem: Bowel/Gastric: Goal: Gastrointestinal status for postoperative course will improve Outcome: Progressing Goal: Occurrences of nausea will decrease Outcome: Progressing   Problem: Coping: Goal: Development of coping mechanisms to deal with changes in body function or appearance will improve Outcome: Progressing   Problem: Fluid Volume: Goal: Maintenance of adequate hydration will improve Outcome: Progressing   Problem: Nutritional: Goal: Nutritional status will improve Outcome: Progressing   Problem: Clinical Measurements: Goal: Will show no signs or symptoms of venous thromboembolism Outcome:  Progressing Goal: Will remain free from infection Outcome: Progressing Goal: Will show no signs of GI Leak Outcome: Progressing   Problem: Respiratory: Goal: Will regain and/or maintain adequate ventilation Outcome: Progressing   Problem: Pain Management: Goal: Pain level will decrease Outcome: Progressing   Problem: Skin Integrity: Goal: Demonstration of wound healing without infection will improve Outcome: Progressing   "

## 2024-06-20 NOTE — Progress Notes (Signed)
 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 Calculator   Recommendation for Discharge: Enoxaparin  40 mg [place] q12h x 2 weeks post-discharge Copay check in progress Discharge orders have been pended in discharge navigator.     Nancy Garrett is a 34 y.o. female who underwent Single Anastomosis DuodenoIleostomy with Sleeve gastrectomy (SADI-S) on 06/20/2024  Case start: 0805 Case end: 1028   Allergies[1]  Patient Measurements: Height: 5' 7 (170.2 cm) Weight: 119 kg (262 lb 6.4 oz) IBW/kg (Calculated) : 61.6 Body mass index is 41.1 kg/m.  No results for input(s): WBC, HGB, HCT, PLT, APTT, CREATININE, LABCREA, CREAT24HRUR, MG, PHOS, ALBUMIN, PROT, AST, ALT, ALKPHOS, BILITOT, BILIDIR, IBILI in the last 72 hours. Estimated Creatinine Clearance: 132.3 mL/min (by C-G formula based on SCr of 0.65 mg/dL).    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 60 tablet 1 06/17/2024   Vitamin D , Ergocalciferol , (DRISDOL ) 1.25 MG (50000 UNIT) CAPS capsule Take 1 capsule (50,000 Units total) by mouth 2 (two) times a week. 24 capsule 0 06/13/2024      Wanda Hasting PharmD, BCPS [facility] main pharmacy [phone] 06/20/2024 1:00 PM     [1]  Allergies Allergen Reactions   Gadolinium Hives, Itching and Nausea And Vomiting    Pt reports she has allergy to MRI contrast not CT contrast. AV, RN 11/24/22   Iodinated Contrast Media    Ivp Dye [Iodinated Contrast Media] Nausea And Vomiting

## 2024-06-20 NOTE — Op Note (Signed)
 "  Patient: Nancy Garrett (1991/02/22, [id number])  Date of Surgery: 06/20/2024  Preoperative Diagnosis: MORBID OBESITY BMI 41, HISTORY OF SLEEVE GASTRECTOMY  Postoperative Diagnosis: MORBID OBESITY BMI 41, HISTORY OF SLEEVE GASTRECTOMY   Surgical Procedure: Robotic conversion of sleeve gastrectomy to single anastomosis duodenoileostomy with sleeve gastrectomy (SADI-S) Robotic cholecystectomy Upper endoscopy  Operative Team Members:  Surgeons and Role:    * Seddrick Flax, Deward PARAS, MD - Primary    * Tanda Locus, MD - Assisting   Anesthesiologist: Jefm Garnette LABOR, MD CRNA: Memory Armida LABOR, CRNA; Vincenzo Show, CRNA   Anesthesia: General   Fluids:  Total I/O In: 1000 [I.V.:1000] Out: 90 [Urine:75; Blood:15]  Complications: None  Drains:  none   Specimen: * No specimens in log *   Disposition:  PACU - hemodynamically stable.  Plan of Care: Admit to inpatient     Indications for Procedure: Nancy Garrett is a 34 y.o. female who presented with BMI 41.1 after sleeve gastrectomy interested in additional weight loss.  After reviewing the available options for surgical weight loss, the patient decided she wanted proceed with a single anastomosis duodenal ileostomy with sleeve gastrectomy or SADI-S procedure.  I explained the gallbladder will be removed during the surgery to prevent future issues.  She completed the preoperative pathway.  We discussed the procedure itself as well as its risk, benefits, and alternatives.  After full discussion all questions answered the patient agreed and consented to proceed.   Findings: Previous sleeve gastrectomy, small umbilical hernia, otherwise no abnormalities noted   Description of Procedure:   On the date stated above the patient was taken operating suite and placed in supine position after general endotracheal anesthesia.  A timeout was completed verifying the correct patient, procedure, position, and equipment needed  for the case.  The patient's abdomen was prepped and draped in usual sterile fashion.  I began by making a 5 mm incision in the left subcostal region inserting a 5 mm trocar in this position.  The abdomen is inflated to 15 mmHg.  There is no trauma to the underlying viscera with initial trocar placement.  This trocar was upgraded to an 8 mm robotic trocar and 3 additional trocars were placed across the abdomen for the robotic instruments.  These were aligned from the left lower quadrant to the right upper quadrant.  The right periumbilical trocar was a 12 mm trocar for the stapler.  The [facility] liver retractor was inserted in the abdomen just to the right of the falciform ligament and positioned to expose the duodenum and gallbladder.  The da Vinci robotic system was docked and robotic surgery began.  I began with the cholecystectomy.  The fundus of the gallbladder was grasped and elevated and I worked from laterally to medially to clear off the infundibulum of the gallbladder.  I identified the cystic duct and cystic artery as the only structures entering in the gallbladder and cleared the use of connective tissue with a clear window in view of the liver behind consistent with a critical view of safety.  A 2-0 silk suture was used to ligate the cystic duct proximally and distally and the duct was divided.  The cystic artery was divided using the vessel sealer.  The gallbladder was divided off the gallbladder fossa using the vessel sealer.  The gallbladder was placed in a Endo Catch bag for removal at the end of the case.  We then proceeded performing the duodenal ileostomy.  The duodenum was identified and  starting at the pylorus I divided the attachments between the duodenum and the retroperitoneum.  First I dissected the peritoneum superior and inferior to the duodenum being careful to avoid the porta hepatis.  I then worked carefully to create a window behind the duodenum.  I sealed and divided vessels as  I encountered them using the vessel sealer.  I was able to pass the tip up grasper completely behind the duodenum.  The white load of the stapler was then positioned around the distal fourth portion of the duodenum and fired to divide the duodenum.  I was careful to avoid injury to the portal structures with the stapler.  The stapler was positioned in order to provide as much anterior wall of the duodenum for anastomosis as possible.  With the duodenum and divided we then inspected the right lower quadrant and identified the terminal ileum.  I ran the bowel 300 cm proximal to the ileocecal valve and selected this is my position to create the anastomosis.  I created a handsewn duodenal ileostomy using two 2-0 V-Loc for the outer layer and two 3 oh V-Loc for the inner layer.  The adult upper endoscope was obtained and inserted down the esophagus, through the sleeve, through the pylorus and the anastomosis was inspected.  I was unable to traverse the anastomosis due to the steep angle that would be required by the endoscope.  I was able to inflate the anastomosis and did not see any evidence of leakage with gas traveling freely into the efferent and afferent limb of the ileum.  The foregut was decompressed and the endoscope removed.  At this point we directed our attention to closure.  The robotic instruments were removed and the robot undocked.  All sutures were removed from the abdomen.  The [facility] liver retractor was removed under direct vision from its position under the liver.  The gallbladder was removed through the 12 mm trocar site at this point.  The 12 mm trocar site was closed using 0 Vicryl in a PMI.  A small umbilical hernia was noted, this did not appear to be a problematic hernia with risk of future incarceration and contained mostly preperitoneal fatty tissue.  This hernia was not addressed during the surgery.  The abdomen was deflated and all trocars removed.  The skin was closed using 4-0 Monocryl  and Dermabond.  All sponge and needle counts were correct at the end of this case.  At the end of the case we reviewed the infection status of the case. Patient: Private Patient Elective Case Case: Elective Infection Present At Time Of Surgery (PATOS): Some spillage of foregut contents related to performing a duodenoileostomy  Deward Foy, MD General, Bariatric, & Minimally Invasive Surgery [facility]  "

## 2024-06-20 NOTE — Anesthesia Postprocedure Evaluation (Signed)
"   Anesthesia Post Note  Patient: Nancy Garrett  Procedure(s) Performed: GASTRECTOMY, ROBOT-ASSISTED, LAPAROSCOPIC (Abdomen) ENDOSCOPY, UPPER GI TRACT LAPAROSCOPIC CHOLECYSTECTOMY     Patient location during evaluation: PACU Anesthesia Type: General Level of consciousness: awake and alert Pain management: pain level controlled Vital Signs Assessment: post-procedure vital signs reviewed and stable Respiratory status: spontaneous breathing, nonlabored ventilation, respiratory function stable and patient connected to nasal cannula oxygen Cardiovascular status: blood pressure returned to baseline and stable Postop Assessment: no apparent nausea or vomiting Anesthetic complications: no   No notable events documented.  Last Vitals:  Vitals:   06/20/24 1115 06/20/24 1130  BP: (!) 138/93   Pulse: 74 63  Resp: 15 13  Temp:    SpO2: 97% 100%    Last Pain:  Vitals:   06/20/24 1115  TempSrc:   PainSc: Asleep                 Garnette DELENA Gab      "

## 2024-06-21 ENCOUNTER — Other Ambulatory Visit (HOSPITAL_COMMUNITY): Payer: Self-pay

## 2024-06-21 ENCOUNTER — Encounter (HOSPITAL_COMMUNITY): Payer: Self-pay | Admitting: Surgery

## 2024-06-21 LAB — CBC WITH DIFFERENTIAL/PLATELET
Abs Immature Granulocytes: 0.04 K/uL (ref 0.00–0.07)
Basophils Absolute: 0 K/uL (ref 0.0–0.1)
Basophils Relative: 0 %
Eosinophils Absolute: 0 K/uL (ref 0.0–0.5)
Eosinophils Relative: 0 %
HCT: 35 % — ABNORMAL LOW (ref 36.0–46.0)
Hemoglobin: 10.9 g/dL — ABNORMAL LOW (ref 12.0–15.0)
Immature Granulocytes: 0 %
Lymphocytes Relative: 21 %
Lymphs Abs: 2.4 K/uL (ref 0.7–4.0)
MCH: 25.6 pg — ABNORMAL LOW (ref 26.0–34.0)
MCHC: 31.1 g/dL (ref 30.0–36.0)
MCV: 82.4 fL (ref 80.0–100.0)
Monocytes Absolute: 0.6 K/uL (ref 0.1–1.0)
Monocytes Relative: 5 %
Neutro Abs: 8.7 K/uL — ABNORMAL HIGH (ref 1.7–7.7)
Neutrophils Relative %: 74 %
Platelets: 199 K/uL (ref 150–400)
RBC: 4.25 MIL/uL (ref 3.87–5.11)
RDW: 18.2 % — ABNORMAL HIGH (ref 11.5–15.5)
WBC: 11.8 K/uL — ABNORMAL HIGH (ref 4.0–10.5)
nRBC: 0 % (ref 0.0–0.2)

## 2024-06-21 LAB — SURGICAL PATHOLOGY

## 2024-06-21 MED ORDER — ENOXAPARIN SODIUM 40 MG/0.4ML IJ SOSY: 40.0000 mg | PREFILLED_SYRINGE | Freq: Two times a day (BID) | INTRAMUSCULAR | 0 refills | Status: AC

## 2024-06-21 MED ORDER — ENOXAPARIN (LOVENOX) PATIENT EDUCATION KIT
1.0000 | PACK | Freq: Once | 0 refills | Status: AC
  Filled 2024-06-21: qty 1, 1d supply, fill #0

## 2024-06-21 MED ORDER — ENOXAPARIN (LOVENOX) PATIENT EDUCATION KIT
PACK | Freq: Once | Status: DC
  Filled 2024-06-21: qty 1

## 2024-06-21 NOTE — Progress Notes (Signed)
" °   06/21/24 1022  TOC Brief Assessment  Insurance and Status Reviewed  Patient has primary care physician Yes  Home environment has been reviewed home with family  Prior level of function: independent  Prior/Current Home Services No current home services  Social Drivers of Health Review SDOH reviewed no interventions necessary  Readmission risk has been reviewed Yes  Transition of care needs no transition of care needs at this time    "

## 2024-06-21 NOTE — Progress Notes (Addendum)
 Patient alert and oriented, pain is controlled. Patient is tolerating fluids, advanced to protein shake today, patient is tolerating well. Reviewed Gastric sleeve/bypass discharge instructions with patient and patient is able to articulate understanding. Provided information on BELT program, Support Group, BSTOP-D, and [facility] outpatient pharmacy. All questions answered. 24hr fluid recall is 840 mL per hydration protocol, bariatric nurse coordinator to make follow-up phone call within one week.    Thank you,  Roseann Medley, RN, MSN Bariatric Nurse Coordinator [phone] (office)

## 2024-06-21 NOTE — Discharge Summary (Signed)
 " Patient ID: Nancy Garrett [id number] 33 y.o. 03/02/1991  06/20/2024  Discharge date and time: 06/21/2024  Admitting Physician: Deward PARAS Kylii Ennis  Discharge Physician: Deward PARAS Darleene Cumpian  Admission Diagnoses: Morbid obesity with BMI of 40.0-44.9, adult (HCC) [E66.01, Z68.41] Patient Active Problem List   Diagnosis Date Noted   Morbid obesity with BMI of 40.0-44.9, adult (HCC) 06/20/2024   Vitamin D  deficiency 10/27/2023   Prediabetes    Hyperlipidemia 10/13/2023   Abnormal MRI 04/15/2023   Chronic migraine w/o aura w/o status migrainosus, not intractable 04/15/2023   Menorrhagia with regular cycle 01/18/2023   Encounter for sterilization 01/18/2023   Ulnar neuropathy at elbow of left upper extremity 07/07/2022   Lumbar radiculopathy 07/07/2022   Chronic daily headache 05/05/2022   ASCUS of cervix with negative high risk HPV 03/08/2022   Morbid obesity (HCC) 02/28/2022   H/O gastric sleeve 02/28/2022   History of gestational diabetes 02/28/2022   History of pre-eclampsia 02/28/2022   Facet degeneration of lumbar region 10/15/2021     Discharge Diagnoses:  Patient Active Problem List   Diagnosis Date Noted   Morbid obesity with BMI of 40.0-44.9, adult (HCC) 06/20/2024   Vitamin D  deficiency 10/27/2023   Prediabetes    Hyperlipidemia 10/13/2023   Abnormal MRI 04/15/2023   Chronic migraine w/o aura w/o status migrainosus, not intractable 04/15/2023   Menorrhagia with regular cycle 01/18/2023   Encounter for sterilization 01/18/2023   Ulnar neuropathy at elbow of left upper extremity 07/07/2022   Lumbar radiculopathy 07/07/2022   Chronic daily headache 05/05/2022   ASCUS of cervix with negative high risk HPV 03/08/2022   Morbid obesity (HCC) 02/28/2022   H/O gastric sleeve 02/28/2022   History of gestational diabetes 02/28/2022   History of pre-eclampsia 02/28/2022   Facet degeneration of lumbar region 10/15/2021    Operations: Procedures: GASTRECTOMY,  ROBOT-ASSISTED, LAPAROSCOPIC ENDOSCOPY, UPPER GI TRACT LAPAROSCOPIC CHOLECYSTECTOMY  Admission Condition: good  Discharged Condition: good  Indication for Admission: Morbid obesity   Hospital Course: Robotic conversion from sleeve gastrectomy to single anastomosis duodenoileostomy with sleeve gastrectomy (SADI-S)  Consults: None  Significant Diagnostic Studies: None  Treatments: surgery: as above  Disposition: Home  Patient Instructions:  Allergies as of 06/21/2024       Reactions   Gadolinium Hives, Itching, Nausea And Vomiting   Pt reports she has allergy to MRI contrast not CT contrast. AV, RN 11/24/22   Iodinated Contrast Media    Ivp Dye [iodinated Contrast Media] Nausea And Vomiting        Medication List     STOP taking these medications    ibuprofen  800 MG tablet Commonly known as: ADVIL        TAKE these medications    acetaminophen  500 MG tablet Commonly known as: TYLENOL  Take 2 tablets (1,000 mg total) by mouth every 8 (eight) hours for 5 days.   enoxaparin  40 MG/0.4ML injection Commonly known as: LOVENOX  Inject 0.4 mLs (40 mg total) into the skin every 12 (twelve) hours.   enoxaparin  Kit Commonly known as: LOVENOX  1 kit by Does not apply route once for 1 dose.   gabapentin  100 MG capsule Commonly known as: NEURONTIN  Take 1 capsule (100 mg total) by mouth every 12 (twelve) hours.   ondansetron  4 MG disintegrating tablet Commonly known as: ZOFRAN -ODT Take 1 tablet (4 mg total) by mouth every 6 (six) hours as needed for nausea or vomiting.   oxyCODONE  5 MG immediate release tablet Commonly known as: Oxy IR/ROXICODONE  Take 1 tablet (5  mg total) by mouth every 6 (six) hours as needed for breakthrough pain or severe pain (pain score 7-10).   pantoprazole  40 MG tablet Commonly known as: PROTONIX  Take 1 tablet (40 mg total) by mouth daily.   Vitamin D  (Ergocalciferol ) 1.25 MG (50000 UNIT) Caps capsule Commonly known as: DRISDOL  Take 1 capsule  (50,000 Units total) by mouth 2 (two) times a week.        Activity: no heavy lifting for 4 weeks Diet: regular diet Wound Care: keep wound clean and dry  Follow-up:  With Dr. Lyndel  Signed: Deward PARAS Sybil [facility], [place]   06/21/2024, 8:25 AM  "

## 2024-06-22 ENCOUNTER — Ambulatory Visit: Payer: Self-pay | Admitting: Clinical

## 2024-06-22 DIAGNOSIS — Z658 Other specified problems related to psychosocial circumstances: Secondary | ICD-10-CM

## 2024-06-23 NOTE — BH Specialist Note (Signed)
 "  ADULT Comprehensive Clinical Assessment (CCA) Note   07/03/2024 Nancy Garrett [id number]  Referring Provider: Glenys Birk, MD Session Start time: [phone]    Session End time: 1114  Total time in minutes: 28   SUBJECTIVE: Nancy Garrett is a 34 y.o.   female accompanied by   Nancy Garrett was seen in consultation at the request of Garrett, Nancy C, NP for evaluation of anxiety and depression.  Types of Service: Individual psychotherapy and Video visit  Reason for referral in patient/family's own words:  Stress; surgery kicking my tail; it's been a rough recovery; depression, can't bathe correctly and hard to breathe while walking    She likes to be called Tilly .  She came to the appointment with self.  Primary language at home is English.  Constitutional Appearance: cooperative, well-nourished, well-developed, alert and well-appearing  (Patient to answer as appropriate) Gender identity: Female Sex assigned at birth: Female Pronouns: she   Mental status exam:   General Appearance /Behavior:  Casual Eye Contact:  Good Motor Behavior:  Normal Speech:  Normal Level of Consciousness:  Alert Mood:  Anxious Affect:  Appropriate Anxiety Level:  Moderate Thought Process:  Coherent Thought Content:  WNL Perception:  Normal Judgment:  Good Insight:  Present   Current Medications and therapies: She is taking:   Outpatient Encounter Medications as of 07/03/2024  Medication Sig   [EXPIRED] acetaminophen  (TYLENOL ) 500 MG tablet Take 2 tablets (1,000 mg total) by mouth every 8 (eight) hours for 5 days.   enoxaparin  (LOVENOX ) 40 MG/0.4ML injection Inject 0.4 mLs (40 mg total) into the skin every 12 (twelve) hours.   gabapentin  (NEURONTIN ) 100 MG capsule Take 1 capsule (100 mg total) by mouth every 12 (twelve) hours.   ondansetron  (ZOFRAN -ODT) 4 MG disintegrating tablet Take 1 tablet (4 mg total) by mouth every 6 (six) hours as needed for nausea or  vomiting.   oxyCODONE  (OXY IR/ROXICODONE ) 5 MG immediate release tablet Take 1 tablet (5 mg total) by mouth every 6 (six) hours as needed for breakthrough pain or severe pain (pain score 7-10).   pantoprazole  (PROTONIX ) 40 MG tablet Take 1 tablet (40 mg total) by mouth daily.   Vitamin D , Ergocalciferol , (DRISDOL ) 1.25 MG (50000 UNIT) CAPS capsule Take 1 capsule (50,000 Units total) by mouth 2 (two) times a week.   Facility-Administered Encounter Medications as of 07/03/2024  Medication   terbinafine  (LAMISIL ) 1 % cream     Therapies:  None  Family history: Family mental illness:  No known history of anxiety disorder, panic disorder, social anxiety disorder, depression, suicide attempt, suicide completion, bipolar disorder, schizophrenia, eating disorder, personality disorder, OCD, PTSD, ADHD Family school achievement history:  No known history of autism, learning disability, intellectual disability Other relevant family history:  Dad alcoholic history  Social History: Now living with husband and children; temporarily in pt's maternal home. Employment:  Returns to work post-surgery tomorrow Main caregiver's health:  Good, has regular medical care Religious or Spiritual Beliefs: N/A  Mood: She experiences anxious and depressed mood.  Negative Mood Concerns. Self-injury:  No Suicidal ideation:  No Suicide attempt:  No  Additional Anxiety Concerns: Panic attacks:  Yes-all the time Obsessions:  No Compulsions:  No  Stressors:  Surgery/procedure  Alcohol and/or Substance Use: Have you recently consumed alcohol? no  Have you recently used any drugs?  no  Have you recently consumed any tobacco? no Does patient seem concerned about dependence or abuse of any substance? no  Substance Use Disorder Checklist:  N/A  Severity Risk Scoring based on DSM-5 Criteria for Substance Use Disorder. The presence of at least two (2) criteria in the last 12 months indicate a substance use  disorder. The severity of the substance use disorder is defined as:  Mild: Presence of 2-3 criteria Moderate: Presence of 4-5 criteria Severe: Presence of 6 or more criteria  Traumatic Experiences: History or current traumatic events (natural disaster, house fire, etc.)? no History or current physical trauma?  no History or current emotional trauma?  no History or current sexual trauma?  no History or current domestic or intimate partner violence?  no History of bullying:  no  Risk Assessment: Suicidal or homicidal thoughts?   No - n/a Self injurious behaviors?  No - n/a Guns in the home?  Yes; locked up  Self Harm Risk Factors: n/a  Self Harm Thoughts?: No  Patient and/or Family's Strengths/Protective Factors: Social connections, Social and Emotional competence, Concrete supports in place (healthy food, safe environments, etc.), and Sense of purpose  Patient's and/or Family's Goals in their own words: Not to panic over my children; think through situations before having meltdowns  Interventions: Interventions utilized:  Solution-Focused Strategies and Supportive Reflection   Patient and/or Family Response: Patient agrees with treatment plan.   Standardized Assessments completed: Not Needed  Patient Centered Plan: Patient is on the following Treatment Plan(s):  IBH  Clinical Assessment/Diagnosis  Adjustment disorder with mixed anxiety and depressed mood  Psychosocial stressors    Patient may benefit from continued therapeutic intervention. .  Coordination of Care: As needed via ob/gyn setting  DSM-5 Diagnosis: Adjustment disorder with mixed anxious and depressed mood Psychosocial stress  Recommendations for Services/Supports/Treatments: Ongoing therapy  Progress towards Goals: Ongoing  Treatment Plan Summary: Behavioral Health Clinician will: Assess individual's status and evaluate for psychiatric symptoms, Provide coping skills enhancement, and  Utilize evidence based practices to address psychiatric symptoms  Individual will: Report any thoughts or plans of harming themselves or others and Utilize coping skills taught in therapy to reduce symptoms  Referral(s): [facility] [facility] (In Clinic)  Warren BROCKS [place], LCSW   "

## 2024-06-28 ENCOUNTER — Telehealth (HOSPITAL_COMMUNITY): Payer: Self-pay | Admitting: *Deleted

## 2024-06-28 NOTE — Telephone Encounter (Signed)
 Follow-up attempt Call could not be completed.

## 2024-06-29 ENCOUNTER — Ambulatory Visit: Payer: Self-pay | Admitting: Clinical

## 2024-06-29 DIAGNOSIS — F4323 Adjustment disorder with mixed anxiety and depressed mood: Secondary | ICD-10-CM

## 2024-06-29 DIAGNOSIS — Z658 Other specified problems related to psychosocial circumstances: Secondary | ICD-10-CM

## 2024-06-29 NOTE — BH Specialist Note (Addendum)
 Behavioral Health Treatment Plan   Name:Nancy Garrett  [facility]  MRN: [id number]   Treatment Plan Development Date: 07/03/24   Strengths: Family, Friends, and Hopefulness  Supports: Spouse, Friends, and Family   Client Statement of Needs: reassurance that I'm not going crazy   Treatment Level:Every 3-4 weeks  Client Treatment Preferences:virtual    Diagnosis: Adjustment Disorder  Goals:  Reduce anxiety and stress using cognitive and behavioral techniques to manage symptoms.  Objectives: Target Date For All Objectives: 10/12/2024  Decrease and effectively manage symptoms of anxiety and depressed mood throughout life changes  Progress Documentation:   Progressing  Interventions:  Solution-Oriented/Positive Psychology  Expected duration of treatment: A few more months post-surgery  Party responsible for implementation of interventions: Warren Mering  This plan has been reviewed and created by the following participants: Ryiah Vankleeck and Fue Cervenka   A new plan will be created at least every 12 months.  The patient fully participated in the development of treatment plan with the clinician and verbally consents to such treatment.   Patient Treatment Plan Signature Obtained:  Due to visit being conducted virtually, verbal consent for the treatment plan was obtained following review of the treatment plan, its intended use, and limitations.The patient and/or guardian demonstrated understanding and were agreeable to the plan.   Ameerah Huffstetler C Damien Cisar, LCSW

## 2024-06-30 ENCOUNTER — Ambulatory Visit: Payer: Self-pay | Admitting: Clinical

## 2024-06-30 NOTE — BH Specialist Note (Signed)
 Error

## 2024-07-03 ENCOUNTER — Encounter: Attending: Surgery | Admitting: Dietician

## 2024-07-03 ENCOUNTER — Encounter: Payer: Self-pay | Admitting: Dietician

## 2024-07-03 ENCOUNTER — Ambulatory Visit: Payer: Self-pay

## 2024-07-03 VITALS — Ht 66.0 in | Wt 251.3 lb

## 2024-07-03 DIAGNOSIS — E669 Obesity, unspecified: Secondary | ICD-10-CM | POA: Diagnosis present

## 2024-07-03 DIAGNOSIS — F4323 Adjustment disorder with mixed anxiety and depressed mood: Secondary | ICD-10-CM

## 2024-07-03 DIAGNOSIS — Z658 Other specified problems related to psychosocial circumstances: Secondary | ICD-10-CM

## 2024-07-03 NOTE — Progress Notes (Signed)
 2 Week Post-Operative Nutrition Follow-up   Start Time: 1449   End Time: 1534  Patient was seen on 07/03/2024 for Post-Operative Nutrition education at [facility]    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  Anthropometrics  Start weight at [facility]: 270.2 lbs (date: 03/02/2024)  Height: 66 in Weight today: 251.3 lbs   Clinical  Medical hx: sleeve gastrectomy, anxiety, tumor Medications: DULoxetine  (CYMBALTA ) 30 MG capsule ibuprofen  (ADVIL ) 800 MG tablet Prenatal Vit-Fe Fumarate-FA (PRENATAL MULTIVITAMIN) TABS tablet rizatriptan  (MAXALT -MLT) 5 MG disintegrating tablet terbinafine  (LAMISIL ) 1 % cream topiramate  (TOPAMAX ) 25 MG tablet Vitamin D , Ergocalciferol , (DRISDOL ) 1.25 MG (50000 UNIT) CAPS capsule Labs: Vit D 15.2 Notable signs/symptoms: none noted Any previous deficiencies? No Bowel Habits: Every day to every other day no complaints   Body Composition Scale 07/03/2024  Current Body Weight 251.3  Total Body Fat % 43.4  Visceral Fat 12  Fat-Free Mass % 56.5   Total Body Water % 42.7  Muscle-Mass lbs 34.3  BMI 40.5  Body Fat Displacement          Torso  lbs 67.6         Left Leg  lbs 13.5         Right Leg  lbs 13.5         Left Arm  lbs 6.7         Right Arm  lbs 6.7    The following the learning objectives were met by the patient during this course: Identifies Soft Prepped Plan Advancement Guide  Identifies Soft, High Proteins (Phase 1), beginning 2 weeks post-operatively to 3 weeks post-operatively Identifies Additional Soft High Proteins, soft non-starchy vegetables, fruits and starches (Phase 2), beginning 3 weeks post-operatively to 3 months post-operatively Identifies appropriate sources of fluids, proteins, vegetables, fruits and starches Identifies appropriate fat sources and healthy verses unhealthy fat types   States protein, vegetable, fruit and starch recommendations and  appropriate sources post-operatively Identifies the need for appropriate texture modifications, mastication, and bite sizes when consuming solids Identifies appropriate fat consumption and sources Identifies appropriate multivitamin and calcium sources post-operatively Describes the need for physical activity post-operatively and will follow MD recommendations States when to call healthcare provider regarding medication questions or post-operative complications   Handouts given during class include: Soft Prepped Plan Advancement Guide   Follow-Up Plan: Patient will follow-up at [facility] in 10 weeks for 3 month post-op nutrition visit for diet advancement per MD.

## 2024-07-04 ENCOUNTER — Encounter

## 2024-07-13 ENCOUNTER — Telehealth: Payer: Self-pay | Admitting: Dietician

## 2024-07-13 NOTE — Telephone Encounter (Signed)
 RD called pt to verify fluid intake once starting soft, solid proteins 2 week post-bariatric surgery.   Daily Fluid intake: 64 oz Daily Protein intake: 60+ grams Bowel Habits: pt states better now; one time bowel movement was after 3 days  Concerns/issues: pt states she has no questions or concerns

## 2024-07-14 ENCOUNTER — Emergency Department (HOSPITAL_COMMUNITY)

## 2024-07-14 ENCOUNTER — Emergency Department (HOSPITAL_COMMUNITY)
Admission: EM | Admit: 2024-07-14 | Discharge: 2024-07-14 | Disposition: A | Discharge status: Home / Self Care | Attending: Emergency Medicine | Admitting: Emergency Medicine

## 2024-07-14 ENCOUNTER — Other Ambulatory Visit: Payer: Self-pay

## 2024-07-14 ENCOUNTER — Encounter (HOSPITAL_COMMUNITY): Payer: Self-pay

## 2024-07-14 DIAGNOSIS — R109 Unspecified abdominal pain: Secondary | ICD-10-CM | POA: Diagnosis present

## 2024-07-14 DIAGNOSIS — R112 Nausea with vomiting, unspecified: Secondary | ICD-10-CM | POA: Diagnosis not present

## 2024-07-14 DIAGNOSIS — G8918 Other acute postprocedural pain: Secondary | ICD-10-CM | POA: Insufficient documentation

## 2024-07-14 LAB — CBC
HCT: 36.1 % (ref 36.0–46.0)
Hemoglobin: 11.6 g/dL — ABNORMAL LOW (ref 12.0–15.0)
MCH: 26.2 pg (ref 26.0–34.0)
MCHC: 32.1 g/dL (ref 30.0–36.0)
MCV: 81.7 fL (ref 80.0–100.0)
Platelets: 181 10*3/uL (ref 150–400)
RBC: 4.42 MIL/uL (ref 3.87–5.11)
RDW: 18.1 % — ABNORMAL HIGH (ref 11.5–15.5)
WBC: 11 10*3/uL — ABNORMAL HIGH (ref 4.0–10.5)
nRBC: 0 % (ref 0.0–0.2)

## 2024-07-14 LAB — COMPREHENSIVE METABOLIC PANEL WITH GFR
ALT: 19 U/L (ref 0–44)
AST: 25 U/L (ref 15–41)
Albumin: 4 g/dL (ref 3.5–5.0)
Alkaline Phosphatase: 109 U/L (ref 38–126)
Anion gap: 12 (ref 5–15)
BUN: 12 mg/dL (ref 6–20)
CO2: 25 mmol/L (ref 22–32)
Calcium: 9.3 mg/dL (ref 8.9–10.3)
Chloride: 103 mmol/L (ref 98–111)
Creatinine, Ser: 0.56 mg/dL (ref 0.44–1.00)
GFR, Estimated: >60 mL/min
Glucose, Bld: 113 mg/dL — ABNORMAL HIGH (ref 70–99)
Potassium: 4.2 mmol/L (ref 3.5–5.1)
Sodium: 139 mmol/L (ref 135–145)
Total Bilirubin: 0.3 mg/dL (ref 0.0–1.2)
Total Protein: 6.9 g/dL (ref 6.5–8.1)

## 2024-07-14 LAB — LIPASE, BLOOD: Lipase: 40 U/L (ref 11–51)

## 2024-07-14 LAB — HCG, SERUM, QUALITATIVE: Preg, Serum: NEGATIVE

## 2024-07-14 MED ORDER — POLYETHYLENE GLYCOL 3350 17 G PO PACK: 17.0000 g | PACK | Freq: Every day | ORAL | 1 refills | Status: AC

## 2024-07-14 MED ORDER — HYDROMORPHONE HCL 1 MG/ML IJ SOLN: 1.0000 mg | Freq: Once | INTRAMUSCULAR | Status: DC

## 2024-07-14 MED ORDER — ONDANSETRON HCL 4 MG/2ML IJ SOLN
4.0000 mg | Freq: Once | INTRAMUSCULAR | Status: AC
  Administered 2024-07-14: 4 mg via INTRAVENOUS
  Filled 2024-07-14: qty 2

## 2024-07-14 MED ORDER — SODIUM CHLORIDE 0.9 % IV BOLUS
1000.0000 mL | Freq: Once | INTRAVENOUS | Status: AC
  Administered 2024-07-14: 1000 mL via INTRAVENOUS

## 2024-07-14 MED ORDER — MORPHINE SULFATE (PF) 4 MG/ML IV SOLN
4.0000 mg | Freq: Once | INTRAVENOUS | Status: AC
  Administered 2024-07-14: 4 mg via INTRAVENOUS
  Filled 2024-07-14: qty 1

## 2024-07-14 NOTE — ED Triage Notes (Signed)
 Pt had gallbladder and SADI surgery for weight loss done on january 6th, no complications since then was woken up with lower abd cramping that has migrated up her abdomen tonight, pt is coming in for mostly the abd cramping with mild nausea and one episode of vomiting at home.

## 2024-07-14 NOTE — Discharge Instructions (Signed)
 You were evaluated in the Emergency Department and after careful evaluation, we did not find any emergent condition requiring admission or further testing in the hospital.  Your exam/testing today is overall reassuring.  Recommend keeping your follow-up with your surgeon later today.  Can use the MiraLAX  to help with any constipation.  Continue your home Zofran .  Please return to the Emergency Department if you experience any worsening of your condition.   Thank you for allowing us  to be a part of your care.

## 2024-09-05 ENCOUNTER — Ambulatory Visit (INDEPENDENT_AMBULATORY_CARE_PROVIDER_SITE_OTHER): Admitting: Family Medicine

## 2024-09-19 ENCOUNTER — Encounter: Admitting: Dietician

## 2024-09-25 ENCOUNTER — Ambulatory Visit: Admitting: Adult Health

## 2024-11-07 ENCOUNTER — Other Ambulatory Visit

## 2024-11-13 ENCOUNTER — Ambulatory Visit: Admitting: Family
